# Patient Record
Sex: Male | Born: 1980 | Race: Black or African American | Hispanic: No | Marital: Single | Smoking: Current every day smoker
Health system: Southern US, Community
[De-identification: ages and names within clinical notes are randomized; demographics above are authoritative.]

## PROBLEM LIST (undated history)

## (undated) DIAGNOSIS — J45909 Unspecified asthma, uncomplicated: Secondary | ICD-10-CM

## (undated) DIAGNOSIS — E119 Type 2 diabetes mellitus without complications: Secondary | ICD-10-CM

## (undated) DIAGNOSIS — T7840XA Allergy, unspecified, initial encounter: Secondary | ICD-10-CM

## (undated) DIAGNOSIS — R609 Edema, unspecified: Secondary | ICD-10-CM

## (undated) HISTORY — DX: Allergy, unspecified, initial encounter: T78.40XA

## (undated) HISTORY — PX: KNEE SURGERY: SHX244

## (undated) HISTORY — DX: Edema, unspecified: R60.9

## (undated) HISTORY — DX: Morbid (severe) obesity due to excess calories: E66.01

---

## 1998-10-11 ENCOUNTER — Encounter: Payer: Self-pay | Admitting: Emergency Medicine

## 1998-10-11 ENCOUNTER — Emergency Department (HOSPITAL_COMMUNITY): Admission: EM | Admit: 1998-10-11 | Discharge: 1998-10-11 | Payer: Self-pay | Admitting: Emergency Medicine

## 1999-02-11 ENCOUNTER — Emergency Department (HOSPITAL_COMMUNITY): Admission: EM | Admit: 1999-02-11 | Discharge: 1999-02-11 | Payer: Self-pay | Admitting: Emergency Medicine

## 1999-02-11 ENCOUNTER — Encounter: Payer: Self-pay | Admitting: Emergency Medicine

## 2000-10-15 ENCOUNTER — Emergency Department (HOSPITAL_COMMUNITY): Admission: EM | Admit: 2000-10-15 | Discharge: 2000-10-15 | Payer: Self-pay | Admitting: Emergency Medicine

## 2000-10-16 ENCOUNTER — Emergency Department (HOSPITAL_COMMUNITY): Admission: EM | Admit: 2000-10-16 | Discharge: 2000-10-16 | Payer: Self-pay | Admitting: Emergency Medicine

## 2006-09-01 ENCOUNTER — Emergency Department (HOSPITAL_COMMUNITY): Admission: EM | Admit: 2006-09-01 | Discharge: 2006-09-01 | Payer: Self-pay | Admitting: Emergency Medicine

## 2008-05-04 ENCOUNTER — Emergency Department (HOSPITAL_COMMUNITY): Admission: EM | Admit: 2008-05-04 | Discharge: 2008-05-04 | Payer: Self-pay | Admitting: Emergency Medicine

## 2011-08-13 ENCOUNTER — Encounter: Payer: Self-pay | Admitting: Student

## 2011-08-13 ENCOUNTER — Emergency Department (HOSPITAL_COMMUNITY)
Admission: EM | Admit: 2011-08-13 | Discharge: 2011-08-14 | Disposition: A | Discharge status: Home / Self Care | Payer: No Typology Code available for payment source | Attending: Emergency Medicine | Admitting: Emergency Medicine

## 2011-08-13 ENCOUNTER — Emergency Department (HOSPITAL_COMMUNITY): Payer: No Typology Code available for payment source

## 2011-08-13 DIAGNOSIS — R55 Syncope and collapse: Secondary | ICD-10-CM | POA: Insufficient documentation

## 2011-08-13 DIAGNOSIS — M25519 Pain in unspecified shoulder: Secondary | ICD-10-CM

## 2011-08-13 DIAGNOSIS — F17200 Nicotine dependence, unspecified, uncomplicated: Secondary | ICD-10-CM | POA: Insufficient documentation

## 2011-08-13 DIAGNOSIS — M542 Cervicalgia: Secondary | ICD-10-CM | POA: Insufficient documentation

## 2011-08-13 DIAGNOSIS — M549 Dorsalgia, unspecified: Secondary | ICD-10-CM | POA: Insufficient documentation
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
## 2011-08-14 ENCOUNTER — Other Ambulatory Visit: Payer: Self-pay

## 2011-08-14 MED ORDER — METHOCARBAMOL 500 MG PO TABS: 500.0000 mg | ORAL_TABLET | Freq: Two times a day (BID) | ORAL | Status: AC

## 2011-08-14 MED ORDER — IBUPROFEN 800 MG PO TABS
800.0000 mg | ORAL_TABLET | Freq: Once | ORAL | Status: AC
  Administered 2011-08-14: 01:00:00 via ORAL

## 2011-08-14 MED ORDER — IBUPROFEN 800 MG PO TABS
ORAL_TABLET | ORAL | Status: AC
  Filled 2011-08-14: qty 1

## 2011-08-14 MED ORDER — TRAMADOL HCL 50 MG PO TABS: 50.0000 mg | ORAL_TABLET | Freq: Four times a day (QID) | ORAL | Status: AC | PRN

## 2011-08-14 NOTE — ED Provider Notes (Signed)
Medical screening examination/treatment/procedure(s) were performed by non-physician practitioner and as supervising physician I was immediately available for consultation/collaboration.   Nat Christen, MD 08/14/11 1500

## 2012-07-11 ENCOUNTER — Emergency Department (HOSPITAL_COMMUNITY)
Admission: EM | Admit: 2012-07-11 | Discharge: 2012-07-11 | Disposition: A | Discharge status: Home / Self Care | Payer: Self-pay | Attending: Emergency Medicine | Admitting: Emergency Medicine

## 2012-07-11 DIAGNOSIS — IMO0002 Reserved for concepts with insufficient information to code with codable children: Secondary | ICD-10-CM

## 2012-07-11 DIAGNOSIS — F17200 Nicotine dependence, unspecified, uncomplicated: Secondary | ICD-10-CM | POA: Insufficient documentation

## 2012-07-11 DIAGNOSIS — S61209A Unspecified open wound of unspecified finger without damage to nail, initial encounter: Secondary | ICD-10-CM | POA: Insufficient documentation

## 2012-07-11 DIAGNOSIS — Z23 Encounter for immunization: Secondary | ICD-10-CM | POA: Insufficient documentation

## 2012-07-11 DIAGNOSIS — W268XXA Contact with other sharp object(s), not elsewhere classified, initial encounter: Secondary | ICD-10-CM | POA: Insufficient documentation

## 2012-07-11 MED ORDER — TETANUS-DIPHTH-ACELL PERTUSSIS 5-2.5-18.5 LF-MCG/0.5 IM SUSP
0.5000 mL | Freq: Once | INTRAMUSCULAR | Status: AC
  Administered 2012-07-11: 0.5 mL via INTRAMUSCULAR
  Filled 2012-07-11: qty 0.5

## 2012-07-11 MED ORDER — CEPHALEXIN 500 MG PO CAPS: 1000.0000 mg | ORAL_CAPSULE | Freq: Two times a day (BID) | ORAL | Status: DC

## 2012-07-11 NOTE — ED Provider Notes (Signed)
History     CSN: [id number]  Arrival date & time 07/11/12  1819   First MD Initiated Contact with Patient 07/11/12 1821      Chief Complaint  Patient presents with  . Laceration    (Consider location/radiation/quality/duration/timing/severity/associated sxs/prior treatment) HPI  31 y.o. male in no acute distress complaining of laceration to right second digit on a piece of bamboo earlier in the day. Tetanus status is unknown and bleeding is controlled.  No past medical history on file.  Past Surgical History  Procedure Date  . Knee surgery     Right and Left Knee    No family history on file.  History  Substance Use Topics  . Smoking status: Current Every Day Smoker  . Smokeless tobacco: Not on file  . Alcohol Use: No      Review of Systems  Constitutional: Negative for fever.  Respiratory: Negative for shortness of breath.   Cardiovascular: Negative for chest pain.  Gastrointestinal: Negative for nausea, vomiting, abdominal pain and diarrhea.  Skin: Positive for wound.  All other systems reviewed and are negative.    Allergies  Review of patient's allergies indicates no known allergies.  Home Medications   Current Outpatient Rx  Name Route Sig Dispense Refill  . CEPHALEXIN 500 MG PO CAPS Oral Take 2 capsules (1,000 mg total) by mouth 2 (two) times daily. 28 capsule 0    BP 125/82  Pulse 78  Temp 99 F (37.2 C)  Resp 16  SpO2 96%  Physical Exam  Nursing note and vitals reviewed. Constitutional: He is oriented to person, place, and time. He appears well-developed and well-nourished. No distress.  HENT:  Head: Normocephalic.  Eyes: Conjunctivae normal and EOM are normal.  Cardiovascular: Normal rate.   Pulmonary/Chest: Effort normal. No stridor.  Musculoskeletal: Normal range of motion.  Neurological: He is alert and oriented to person, place, and time.  Skin:       1 cm full-thickness laceration to right first digit radial side of the PIP.  Bleeding is controlled. Wound is grossly clean. There is no joint involvement. Patient has full range of motion to the isolated PIP. Cap refill is less than 2 seconds and distal sensation is intact.  Psychiatric: He has a normal mood and affect.    ED Course  Procedures (including critical care time)  LACERATION REPAIR Performed by: Wynetta Emery Authorized by: Wynetta Emery Consent: Verbal consent obtained. Risks and benefits: risks, benefits and alternatives were discussed Consent given by: patient Patient identity confirmed: provided demographic data Prepped and Draped in normal sterile fashion Wound explored  Laceration Location: Right hand second digit radial side of the distal interphalangeal joint  Laceration Length: 1 cm  No Foreign Bodies seen or palpated  Anesthesia: None  Irrigation method: syringe Amount of cleaning: standard  Skin closure: Dermabond applied in 3 layers.    Patient tolerance: Patient tolerated the procedure well with no immediate complications.   Labs Reviewed - No data to display No results found.   1. Laceration       MDM  Full-thickness laceration with no joint involvement closed with Dermabond. Tetanus will be updated and patient will be given Keflex and return precautions.  New Prescriptions   CEPHALEXIN (KEFLEX) 500 MG CAPSULE    Take 2 capsules (1,000 mg total) by mouth 2 (two) times daily.         Wynetta Emery, PA-C 07/11/12 1905

## 2012-07-11 NOTE — ED Notes (Signed)
Pt cut tip of R index finger on a piece of bamboo. Bleeding controlled with pressure. Pt not sure when his last Tetanus shot was.

## 2012-07-15 NOTE — ED Provider Notes (Signed)
Medical screening examination/treatment/procedure(s) were performed by non-physician practitioner and as supervising physician I was immediately available for consultation/collaboration.  Raeford Razor, MD 07/15/12 2350

## 2012-09-21 ENCOUNTER — Emergency Department (HOSPITAL_COMMUNITY)
Admission: EM | Admit: 2012-09-21 | Discharge: 2012-09-21 | Disposition: A | Discharge status: Home / Self Care | Payer: No Typology Code available for payment source | Attending: Emergency Medicine | Admitting: Emergency Medicine

## 2012-09-21 ENCOUNTER — Encounter (HOSPITAL_COMMUNITY): Payer: Self-pay | Admitting: *Deleted

## 2012-09-21 ENCOUNTER — Emergency Department (HOSPITAL_COMMUNITY): Payer: No Typology Code available for payment source

## 2012-09-21 DIAGNOSIS — Y939 Activity, unspecified: Secondary | ICD-10-CM | POA: Insufficient documentation

## 2012-09-21 DIAGNOSIS — J45909 Unspecified asthma, uncomplicated: Secondary | ICD-10-CM | POA: Insufficient documentation

## 2012-09-21 DIAGNOSIS — M549 Dorsalgia, unspecified: Secondary | ICD-10-CM | POA: Insufficient documentation

## 2012-09-21 DIAGNOSIS — Y92410 Unspecified street and highway as the place of occurrence of the external cause: Secondary | ICD-10-CM | POA: Insufficient documentation

## 2012-09-21 DIAGNOSIS — F17200 Nicotine dependence, unspecified, uncomplicated: Secondary | ICD-10-CM | POA: Insufficient documentation

## 2012-09-21 DIAGNOSIS — M25519 Pain in unspecified shoulder: Secondary | ICD-10-CM | POA: Insufficient documentation

## 2012-09-21 DIAGNOSIS — M7918 Myalgia, other site: Secondary | ICD-10-CM

## 2012-09-21 HISTORY — DX: Unspecified asthma, uncomplicated: J45.909

## 2012-09-21 MED ORDER — OXYCODONE-ACETAMINOPHEN 5-325 MG PO TABS: ORAL_TABLET | ORAL | Status: DC

## 2012-09-21 NOTE — ED Notes (Signed)
Pt alert and oriented x4. Respirations even and unlabored, bilateral symmetrical rise and fall of chest. Skin warm and dry. In no acute distress. Denies needs.   

## 2012-09-21 NOTE — ED Provider Notes (Signed)
Medical screening examination/treatment/procedure(s) were performed by non-physician practitioner and as supervising physician I was immediately available for consultation/collaboration.  Sadik Piascik, MD 09/21/12 2344 
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
## 2013-03-23 ENCOUNTER — Ambulatory Visit (HOSPITAL_COMMUNITY): Admission: RE | Admit: 2013-03-23 | Payer: No Typology Code available for payment source | Source: Ambulatory Visit

## 2013-03-23 ENCOUNTER — Encounter (HOSPITAL_COMMUNITY): Payer: Self-pay | Admitting: Emergency Medicine

## 2013-03-23 ENCOUNTER — Emergency Department (HOSPITAL_COMMUNITY)
Admission: EM | Admit: 2013-03-23 | Discharge: 2013-03-23 | Disposition: A | Discharge status: Home / Self Care | Payer: Self-pay | Attending: Emergency Medicine | Admitting: Emergency Medicine

## 2013-03-23 DIAGNOSIS — M79609 Pain in unspecified limb: Secondary | ICD-10-CM | POA: Insufficient documentation

## 2013-03-23 DIAGNOSIS — M25473 Effusion, unspecified ankle: Secondary | ICD-10-CM | POA: Insufficient documentation

## 2013-03-23 DIAGNOSIS — Z79899 Other long term (current) drug therapy: Secondary | ICD-10-CM | POA: Insufficient documentation

## 2013-03-23 DIAGNOSIS — J45909 Unspecified asthma, uncomplicated: Secondary | ICD-10-CM | POA: Insufficient documentation

## 2013-03-23 DIAGNOSIS — M79672 Pain in left foot: Secondary | ICD-10-CM

## 2013-03-23 DIAGNOSIS — M2550 Pain in unspecified joint: Secondary | ICD-10-CM | POA: Insufficient documentation

## 2013-03-23 DIAGNOSIS — M25476 Effusion, unspecified foot: Secondary | ICD-10-CM | POA: Insufficient documentation

## 2013-03-23 DIAGNOSIS — F17200 Nicotine dependence, unspecified, uncomplicated: Secondary | ICD-10-CM | POA: Insufficient documentation

## 2013-03-23 MED ORDER — SULFAMETHOXAZOLE-TRIMETHOPRIM 800-160 MG PO TABS: 1.0000 | ORAL_TABLET | Freq: Two times a day (BID) | ORAL | Status: AC

## 2013-03-23 MED ORDER — ENOXAPARIN SODIUM 150 MG/ML ~~LOC~~ SOLN
180.0000 mg | SUBCUTANEOUS | Status: AC
  Administered 2013-03-23: 180 mg via SUBCUTANEOUS
  Filled 2013-03-23: qty 2

## 2013-03-23 MED ORDER — TRAMADOL HCL 50 MG PO TABS: 50.0000 mg | ORAL_TABLET | Freq: Four times a day (QID) | ORAL | Status: DC | PRN

## 2013-03-23 NOTE — ED Notes (Signed)
Pt alert, arrives from home, c/o pain in left foot, denies trauma or injury, states "its fathers day, i am here to get it checked out", resp even unlabored, skin pwd

## 2013-03-23 NOTE — ED Notes (Signed)
Humes, PA at bedside to evaluate patient.

## 2013-03-23 NOTE — ED Provider Notes (Signed)
History     CSN: [id number]  Arrival date & time 03/23/13  0213   First MD Initiated Contact with Patient 03/23/13 [phone]      Chief Complaint  Patient presents with  . Foot Pain    (Consider location/radiation/quality/duration/timing/severity/associated sxs/prior treatment) HPI Comments: Patient is a 32 year old male who presents for left foot pain x3 days. Patient states the symptoms are associated with swelling in his ankle as well as redness. Patient denies trauma or injury to his left foot or ankle. He denies calf tenderness, numbness or tingling, extremity weakness, shortness of breath, and fevers. Patient is ambulatory without difficulty.  Patient is a 32 y.o. male presenting with lower extremity pain. The history is provided by the patient. No language interpreter was used.  Foot Pain This is a new problem. Episode onset: 3 days. The problem occurs intermittently. The problem has been gradually worsening. Associated symptoms include arthralgias and joint swelling (ankle and foot). Pertinent negatives include no fatigue, fever, nausea, numbness, rash or weakness. The symptoms are aggravated by walking. He has tried nothing for the symptoms.    Past Medical History  Diagnosis Date  . Asthma     Past Surgical History  Procedure Laterality Date  . Knee surgery      Right and Left Knee    No family history on file.  History  Substance Use Topics  . Smoking status: Current Every Day Smoker    Types: Cigarettes  . Smokeless tobacco: Not on file  . Alcohol Use: No     Review of Systems  Constitutional: Negative for fever and fatigue.  Respiratory: Negative for shortness of breath.   Gastrointestinal: Negative for nausea.  Musculoskeletal: Positive for joint swelling (ankle and foot) and arthralgias. Negative for gait problem.  Skin: Positive for color change. Negative for rash and wound.  Neurological: Negative for weakness and numbness.  All other systems reviewed  and are negative.    Allergies  Review of patient's allergies indicates no known allergies.  Home Medications   Current Outpatient Rx  Name  Route  Sig  Dispense  Refill  . sulfamethoxazole-trimethoprim (BACTRIM DS,SEPTRA DS) 800-160 MG per tablet   Oral   Take 1 tablet by mouth 2 (two) times daily. Begin course if results of ultrasound are negative.   14 tablet   0   . traMADol (ULTRAM) 50 MG tablet   Oral   Take 1 tablet (50 mg total) by mouth every 6 (six) hours as needed for pain.   15 tablet   0     BP 138/77  Pulse 81  Temp(Src) 98 F (36.7 C)  Resp 16  SpO2 99%  Physical Exam  Nursing note and vitals reviewed. Constitutional: He is oriented to person, place, and time. He appears well-developed and well-nourished. No distress.  HENT:  Head: Normocephalic and atraumatic.  Eyes: Conjunctivae and EOM are normal.  Neck: Normal range of motion. Neck supple.  Cardiovascular: Normal rate, regular rhythm and intact distal pulses.   Dorsal pedis and posterior tibial pulses 2+ bilaterally. Capillary refill normal in bilateral lower extremities.  Pulmonary/Chest: Effort normal. No respiratory distress.  Musculoskeletal:       Left ankle: He exhibits swelling (Mild). He exhibits normal range of motion, no ecchymosis, no deformity, no laceration and normal pulse. No tenderness. Achilles tendon normal.       Left lower leg: Normal.       Left foot: He exhibits tenderness (Mild) and swelling (Mild). He exhibits  normal range of motion, no bony tenderness, normal capillary refill, no deformity and no laceration.       Feet:  Dorsal surface of left foot proximal to great toe erythematous with mild swelling and heat-to-touch. There is no decreased range of motion; very minimal TTP appreciated. No calf tenderness. Capillary refill, DP/PT pulses, and sensation normal.  Neurological: He is alert and oriented to person, place, and time.  Skin: Skin is warm and dry. No rash noted. He  is not diaphoretic. There is erythema. No pallor.  Psychiatric: He has a normal mood and affect. His behavior is normal.    ED Course  Procedures (including critical care time)  Labs Reviewed - No data to display No results found.   1. Foot pain, left     MDM  Left foot discomfort and swelling x3 days. DDx - cellulitis vs DVT. Patient neurovascularly intact on physical exam with normal sensation and capillary refill. Have advised patient to have symptoms further evaluated with Venous Duplex to definitively r/o DVT in LLE. Have given the patient the options to have study performed in ED once U/S arrives or to have study done as an outpatient. Patient opts to have study done as outpatient. Will give dose of Lovenox for empiric tx of DVT in ED. Patient appropriate for discharge with instructions for outpatient Venous Duplex. Have also prescribed Bactrim to take for symptoms if venous duplex is negative. Tramadol prescribed for discomfort. Patient verbalizes comfort and understanding with plan with no unaddressed concerns. Patient case discussed with Dr. Preston Fleeting who is in agreement.        Antony Madura, PA-C 03/25/13 [phone]

## 2013-03-24 ENCOUNTER — Ambulatory Visit (HOSPITAL_COMMUNITY)
Admission: RE | Admit: 2013-03-24 | Discharge: 2013-03-24 | Disposition: A | Discharge status: Home / Self Care | Payer: Self-pay | Source: Ambulatory Visit | Attending: Emergency Medicine | Admitting: Emergency Medicine

## 2013-03-24 DIAGNOSIS — M7989 Other specified soft tissue disorders: Secondary | ICD-10-CM | POA: Insufficient documentation

## 2013-03-24 DIAGNOSIS — M79609 Pain in unspecified limb: Secondary | ICD-10-CM | POA: Insufficient documentation

## 2013-03-24 NOTE — Progress Notes (Signed)
Left lower extremity venous duplex completed.  Left:  No evidence of DVT, superficial thrombosis, or Baker's cyst.  Right:  Negative for DVT in the common femoral vein.  

## 2013-03-27 NOTE — ED Provider Notes (Signed)
Medical screening examination/treatment/procedure(s) were performed by non-physician practitioner and as supervising physician I was immediately available for consultation/collaboration.  Dione Booze, MD 03/27/13 [phone]

## 2014-02-01 ENCOUNTER — Emergency Department (HOSPITAL_COMMUNITY)
Admission: EM | Admit: 2014-02-01 | Discharge: 2014-02-01 | Disposition: A | Discharge status: Home / Self Care | Payer: No Typology Code available for payment source | Attending: Emergency Medicine | Admitting: Emergency Medicine

## 2014-02-01 ENCOUNTER — Encounter (HOSPITAL_COMMUNITY): Payer: Self-pay | Admitting: Emergency Medicine

## 2014-02-01 DIAGNOSIS — J45909 Unspecified asthma, uncomplicated: Secondary | ICD-10-CM | POA: Insufficient documentation

## 2014-02-01 DIAGNOSIS — F17200 Nicotine dependence, unspecified, uncomplicated: Secondary | ICD-10-CM | POA: Insufficient documentation

## 2014-02-01 DIAGNOSIS — H00019 Hordeolum externum unspecified eye, unspecified eyelid: Secondary | ICD-10-CM | POA: Insufficient documentation

## 2014-02-01 DIAGNOSIS — Z79899 Other long term (current) drug therapy: Secondary | ICD-10-CM | POA: Insufficient documentation

## 2014-02-01 MED ORDER — TETRACAINE HCL 0.5 % OP SOLN
1.0000 [drp] | Freq: Once | OPHTHALMIC | Status: AC
  Administered 2014-02-01: 1 [drp] via OPHTHALMIC
  Filled 2014-02-01: qty 2

## 2014-02-01 MED ORDER — FLUORESCEIN SODIUM 1 MG OP STRP
1.0000 | ORAL_STRIP | Freq: Once | OPHTHALMIC | Status: AC
  Administered 2014-02-01: 1 via OPHTHALMIC
  Filled 2014-02-01: qty 1

## 2014-02-01 NOTE — ED Notes (Signed)
Right eye 20/40 Left eye 20/25

## 2014-02-01 NOTE — ED Notes (Signed)
Per pt, states he mowed lawn last tues and now his left eye is irritated and swollen

## 2014-02-01 NOTE — ED Notes (Signed)
Patient left the department prior to receiving discharge instructions.

## 2014-02-01 NOTE — Progress Notes (Signed)
[facility] provided pt with a list of primary care resources and a [facility] [employer] application to help patient establish primary care.  °

## 2014-02-01 NOTE — ED Provider Notes (Signed)
CSN: [id number]     Arrival date & time 02/01/14  1308 History  This chart was scribed for non-physician practitioner, Coral CeoJessica Miral Hoopes, PA-C working with Audree CamelScott T Goldston, MD by Greggory StallionKayla Andersen, ED scribe. This patient was seen in room WTR8/WTR8 and the patient's care was started at 3:17 PM.   Chief Complaint  Patient presents with  . Facial Swelling   The history is provided by the patient. No language interpreter was used.   HPI Comments: [place] P Benson is a 33 y.o. male with a PMH of asthma who presents to the Emergency Department complaining of left eye swelling which started 6 days ago after mowing the lawn. Denies any known foreign bodies or trauma. Complains of a constant aching pain, itching, and a "knot" to the left upper inner eyelid. He has taken OTC allergy medications with no relief. Denies eye discharge, visual disturbance, photophobia, rhinorrhea, congestion, sore throat, ear pain, fever, or headache. Patient wears glasses and contacts occasionally.    Past Medical History  Diagnosis Date  . Asthma    Past Surgical History  Procedure Laterality Date  . Knee surgery      Right and Left Knee   No family history on file. History  Substance Use Topics  . Smoking status: Current Every Day Smoker    Types: Cigarettes  . Smokeless tobacco: Not on file  . Alcohol Use: No    Review of Systems  Constitutional: Negative for fever, chills, activity change, appetite change and fatigue.  HENT: Negative for congestion, ear discharge, ear pain, facial swelling, rhinorrhea, sore throat and trouble swallowing.   Eyes: Positive for pain and itching. Negative for photophobia, discharge, redness and visual disturbance.  Gastrointestinal: Negative for nausea, vomiting and abdominal pain.  Skin: Negative for wound.  Neurological: Negative for dizziness, light-headedness and headaches.  All other systems reviewed and are negative.  Allergies  Review of patient's allergies indicates no  known allergies.  Home Medications   Prior to Admission medications   Medication Sig Start Date End Date Taking? Authorizing Provider  traMADol (ULTRAM) 50 MG tablet Take 1 tablet (50 mg total) by mouth every 6 (six) hours as needed for pain. 03/23/13   Antony MaduraKelly Humes, PA-C   BP 131/71  Pulse 84  Temp(Src) 98.6 F (37 C) (Oral)  Resp 16  SpO2 96%  Filed Vitals:   02/01/14 1337  BP: 131/71  Pulse: 84  Temp: 98.6 F (37 C)  TempSrc: Oral  Resp: 16  SpO2: 96%    Physical Exam  Nursing note and vitals reviewed. Constitutional: He is oriented to person, place, and time. He appears well-developed and well-nourished. No distress.  HENT:  Head: Normocephalic and atraumatic.  Right Ear: Tympanic membrane, external ear and ear canal normal.  Left Ear: Tympanic membrane, external ear and ear canal normal.  Nose: Nose normal.  Mouth/Throat: Oropharynx is clear and moist. No oropharyngeal exudate.  Tympanic membranes gray and translucent bilaterally with no erythema, edema, or hemotympanum.  No mastoid or tragal tenderness bilaterally. No erythema to the posterior pharynx. Tonsils without edema or exudates. Uvula midline. No trismus. No difficulty controlling secretions.   Eyes: Conjunctivae and EOM are normal. Pupils are equal, round, and reactive to light. Right eye exhibits no discharge and no exudate. No foreign body present in the right eye. Left eye exhibits hordeolum. Left eye exhibits no discharge and no exudate. No foreign body present in the left eye. Right conjunctiva is not injected. Right conjunctiva has no hemorrhage.  Left conjunctiva is not injected. Left conjunctiva has no hemorrhage.    5 mm circular firm mass palpated in the medial upper left eyelid with associated erythema and edema. Sclera clear bilaterally. No foreign bodies or lacerations. No pain with eye movement. No drainage.   Neck: Neck supple. No tracheal deviation present.  No cervical lymphadenopathy. No nuchal  rigidity.   Cardiovascular: Normal rate.   Pulmonary/Chest: Effort normal. No respiratory distress.  Musculoskeletal: Normal range of motion.  Neurological: He is alert and oriented to person, place, and time.  Skin: Skin is warm and dry. He is not diaphoretic.  Psychiatric: He has a normal mood and affect. His behavior is normal.    ED Course  Procedures (including critical care time)  DIAGNOSTIC STUDIES: Oxygen Saturation is 96% on RA, normal by my interpretation.    COORDINATION OF CARE: 3:21 PM-Discussed treatment plan which includes visual acuity and checking for corneal abrasion with pt at bedside and pt agreed to plan.   Labs Review Labs Reviewed - No data to display  Imaging Review No results found.   EKG Interpretation None      MDM   [place] P Kang is a 33 y.o. male with a PMH of asthma who presents to the Emergency Department complaining of left eye swelling which started 6 days ago after mowing the lawn.  Rechecks  3:50 PM = Right eye 20/40 & Left eye 20/25 4:00 PM = No increased uptake or foreign body on woods lamp exam.    Etiology of eye pain possibly due to a stye vs chalazion. More likely a stye due to pain. No evidence of a foreign body or corneal abrasion. Visual acuity intact. No evidence of a conjunctivitis or periorbital edema. No discharge or evidence of an infectious process. Did not feel patient required antibiotic drops at this time. Patient afebrile and non-toxic in appearance. Instructed patient to apply warm compresses. Follow-up with PCP if not improving or resolving. Return precautions, discharge instructions, and follow-up was discussed with the patient before discharge.     Discharge Medication List as of 02/01/2014  4:18 PM      Final impressions: 1. Stye      Luiz IronJessica Katlin Lizzet Hendley PA-C    I personally performed the services described in this documentation, which was scribed in my presence. The recorded information has been  reviewed and is accurate.  Jillyn LedgerJessica K [place], PA-C 02/02/14 1251

## 2014-02-01 NOTE — Discharge Instructions (Signed)
Your eyelid edema may be due to a sty or chalazion Please follow-up with an eye doctor (or your primary doctor) if your symptoms worsen or are not improving or worsening  Return to the emergency department if you develop any changing/worsening condition, fever, eyelid swelling, pain with eye movement, eye drainage, or any other concerns (please read additional information regarding your condition below)  Sty A sty (hordeolum) is an infection of a gland in the eyelid located at the base of the eyelash. A sty may develop a white or yellow head of pus. It can be puffy (swollen). Usually, the sty will burst and pus will come out on its own. They do not leave lumps in the eyelid once they drain. A sty is often confused with another form of cyst of the eyelid called a chalazion. Chalazions occur within the eyelid and not on the edge where the bases of the eyelashes are. They often are red, sore and then form firm lumps in the eyelid. CAUSES   Germs (bacteria).  Lasting (chronic) eyelid inflammation. SYMPTOMS   Tenderness, redness and swelling along the edge of the eyelid at the base of the eyelashes.  Sometimes, there is a white or yellow head of pus. It may or may not drain. DIAGNOSIS  An ophthalmologist will be able to distinguish between a sty and a chalazion and treat the condition appropriately.  TREATMENT   Styes are typically treated with warm packs (compresses) until drainage occurs.  In rare cases, medicines that kill germs (antibiotics) may be prescribed. These antibiotics may be in the form of drops, cream or pills.  If a hard lump has formed, it is generally necessary to do a small incision and remove the hardened contents of the cyst in a minor surgical procedure done in the office.  In suspicious cases, your caregiver may send the contents of the cyst to the lab to be certain that it is not a rare, but dangerous form of cancer of the glands of the eyelid. HOME CARE INSTRUCTIONS     Wash your hands often and dry them with a clean towel. Avoid touching your eyelid. This may spread the infection to other parts of the eye.  Apply heat to your eyelid for 10 to 20 minutes, several times a day, to ease pain and help to heal it faster.  Do not squeeze the sty. Allow it to drain on its own. Wash your eyelid carefully 3 to 4 times per day to remove any pus. SEEK IMMEDIATE MEDICAL CARE IF:   Your eye becomes painful or puffy (swollen).  Your vision changes.  Your sty does not drain by itself within 3 days.  Your sty comes back within a short period of time, even with treatment.  You have redness (inflammation) around the eye.  You have a fever. Document Released: 07/04/2005 Document Revised: 12/17/2011 Document Reviewed: 03/08/2009 [facility] Patient Information 2014 [place], [place].   Emergency Department Resource Guide 1) Find a Doctor and Pay Out of Pocket Although you won't have to find out who is covered by your insurance plan, it is a good idea to ask around and get recommendations. You will then need to call the office and see if the doctor you have chosen will accept you as a new patient and what types of options they offer for patients who are self-pay. Some doctors offer discounts or will set up payment plans for their patients who do not have insurance, but you will need to ask so  you aren't surprised when you get to your appointment.  2) Contact Your Local Health Department Not all health departments have doctors that can see patients for sick visits, but many do, so it is worth a call to see if yours does. If you don't know where your local health department is, you can check in your phone book. The CDC also has a tool to help you locate your state's health department, and many state websites also have listings of all of their local health departments.  3) Find a Walk-in Clinic If your illness is not likely to be very severe or complicated, you may want to try  a walk in clinic. These are popping up all over the country in pharmacies, drugstores, and shopping centers. They're usually staffed by nurse practitioners or physician assistants that have been trained to treat common illnesses and complaints. They're usually fairly quick and inexpensive. However, if you have serious medical issues or chronic medical problems, these are probably not your best option.  No Primary Care Doctor: - Call Health Connect at  [phone] - they can help you locate a primary care doctor that  accepts your insurance, provides certain services, etc. - Physician Referral Service- [phone]  Chronic Pain Problems: Organization         Address  Phone   Notes  [facility]  [phone] Patients need to be referred by their primary care doctor.   Medication Assistance: Organization         Address  Phone   Notes  [facility] Medication [facility] [address] [place] [place], [place] [address] [phone] --Must be a resident of [facility] -- Must have NO insurance coverage whatsoever (no Medicaid/ Medicare, etc.) -- The pt. MUST have a primary care doctor that directs their care regularly and follows them in the community   [facility]  [phone]   [employer]  [phone]    Agencies that provide inexpensive medical care: Organization         Address  Phone   Notes  [facility]  [phone]   [facility]    [phone]   [facility] [address] [place], [place] [address] [phone]   [facility] [address] [place] [place] [phone]   Planned Parenthood    [phone]   [facility]    [phone]   [facility] and [facility]  [address], [place] Phone:  [phone], Fax:  [phone] Hours of Operation:  9 am - 6 pm, M-F.  Also accepts Medicaid/Medicare and self-pay.  [facility]  [address], [place] Phone: [phone], Fax: [phone]. Hours of Operation:  8:30 am - 5:30 pm, M-F.  Also accepts Medicaid and self-pay.  [facility] [place] [address] [place] Phone: [phone]   [facility] [address] Natasha Bence [place], [place] [phone], Ext. [phone] Mondays & Thursdays: 7-9 AM.  First 15 patients are seen on a first come, first serve basis.    Medicaid-accepting [facility] Providers:  Organization         Address  Phone   Notes  [facility] [address], [place] [phone] Also accepts self-pay patients.  [facility] [address] Laurell Josephs [place], [place]  [phone]  [facility] [address], [place] [phone]   [facility] [address] [place] [address], [place] [phone]   Renaye RakersVeita Bland [address], [place]   [phone] Only accepts [place] Access [place] patients after they have their name applied to their card.   Self-Pay (no insurance) in [facility]:  Organization         Address  Phone   Notes  Sickle Cell Patients, [facility] [address], [place] [phone]   [facility] [address], [place] [phone]   [facility] [place]  [address], [place] [phone]   [facility]/Dr. Osei-Bonsu  [address] [place] [address], [place] or [phone], [place] [phone] Phone number for both [place] and [place] locations is the same.  [facility] [address], [place] [phone]   [facility] [address] [place] [address], [place] or [address] [phone]   [facility] [address], [place] [phone], phone; [phone], fax  Sees patients 1st and 3rd Saturday of every month.  Must not qualify for public or private insurance (i.e. Medicaid, Medicare, [place] Health Choice, Veterans' Benefits)  Household income should be no more than 200% of the poverty level The clinic cannot treat you if you are pregnant or think you are pregnant  Sexually transmitted diseases are not treated at the clinic.    Dental Care: Organization         Address  Phone  Notes  [facility] [address], [place] [phone] Accepts children up to age 321 who are enrolled in [place]icaid or [place] Health Choice; pregnant women with a Medicaid card; and children who have applied for Medicaid or [place] Health Choice, but were declined, whose parents can pay a reduced fee at time of service.  [facility] [place]  [address], [place] [phone] Accepts children up to age 35 who are enrolled in [place] or [place] Health Choice; pregnant women with a Medicaid card; and children who have applied for Medicaid or [place] Health Choice, but were declined, whose parents can pay a reduced fee at time of service.  [facility]  [address], [place] [phone] Patients are seen by appointment only. Walk-ins are not accepted. [facility] will see patients 35 years of age and older. Monday - Tuesday (8am-5pm) Most Wednesdays (8:30-5pm) $30 per visit, cash only  [facility]  [address], [facility] [phone] Patients are seen by appointment only. Walk-ins are not accepted. [facility] will see patients 35 years of age and older. One Wednesday Evening (Monthly: Volunteer Based).  $30 per visit, cash only  [facility]  [phone] for adults; Children under age 424, call Graduate Pediatric Dentistry at [phone]. Children aged 54-14, please call [phone] to  request a pediatric application.  Dental services are provided in all areas of dental care including fillings, crowns and bridges, complete and partial dentures, implants, gum treatment, root canals, and extractions. Preventive care is also provided. Treatment is provided to both adults and children. Patients are selected via a lottery and there is often a waiting list.  [facility] [address], [place]  [phone] [email or web address]   [facility] [address], [place], [place] [phone], Ext. [phone] Second and Fourth Thursday of each month, opens at 6:30 AM; Clinic ends at 9 AM.  Patients are seen on a first-come first-served basis, and a limited number are seen during each clinic.   [facility]  [address], [place], [place] [phone]   Eligibility Requirements You must have lived in [place], [place], or [place] counties for at least the last three months.   You cannot be eligible for state or federal sponsored National Cityhealthcare insurance, including CIGNAVeterans Administration, [place]icaid, or [employer]Medicare.   You generally cannot be eligible for healthcare insurance through your employer.    How to apply: Eligibility screenings are held every Tuesday and Wednesday afternoon from 1:00 pm until 4:00 pm. You do not need an appointment for the interview!  [facility] [address], [place], [place] [phone]   [facility]  [phone]   [facility]  [phone]   [facility]  [phone]    Behavioral Health Resources in the Community: Intensive Outpatient Programs Organization         Address  Phone  Notes  [facility] [address], [place], [place] [phone]   [facility] [address], [place], [place] [phone]   [facility] [address], [place], [place]  [phone]   [facility] [address],  [place], [place] [phone] or [phone]   Substance Abuse Resources Organization         Address  Phone  Notes  [facility]  [phone]   [facility]  [phone]   [facility]  [phone]   [facility]  [phone]   Residential & Outpatient Substance Abuse Program  [phone]   Psychological Services Organization         Address  Phone  Notes  [facility]  [phone]- [phone]   [facility]  [phone]   [facility] [address], [place] [phone] or [phone]    Mobile Crisis Teams Organization         Address  Phone  Notes  [facility], Mobile Crisis Care Unit  [phone]   [facility]  [address] [place], [place] [phone]   Doristine LocksSharon DeEsch [address] [place] [phone]    Self-Help/Support Groups Organization         Address  Phone             Notes  [facility] [place] - variety of support groups  336- [phone] Call for more information  Narcotics Anonymous (NA), [facility] [address], [place]  2 meetings at this location   Statisticianesidential Treatment Programs Organization         Address  Phone  Notes  [facility] [address],    [place]  [phone]   [facility]  [address], [place] [phone], [place], [place] [phone]   [facility] [address], [place] [phone] Admissions: 8am-3pm M-F  [facility] [address]    [place], [place] [phone]   [facility] [address], [place], [place] [phone]   [facility] [address]  [place], [place] [phone]   °[facility] - Intensive Outpatient [address], [place], [place] [phone]   °[facility] ([facility] [address]  °[place], [place] 1-877-615-2722 or [phone]   °[facility] ([facility]) [address] [place], [place] [phone] Accepts Medicaid  °[facility] [address]  °[place] 1-800-659-3381 Substance Abuse/Addiction Treatment  ° °[place] Behavioral Health Resources °Organization         Address  Phone  Notes  °[facility]  (888) [phone]   °Julie Brannon, PhD [address] [place], [place]   ([phone]) [phone] or (336) 951-0000   °[facility]   [address] °[place], [place] (336) [phone]   °[facility] [address], [place], [place] (336) [phone] Insurance/Medicaid/sponsorship through [facility]  °[facility] [address]                                    [place], [place] (336) [phone] Therapy/tele-psych/case  °[facility] [address]  ° [place], [place] (336) [phone]    °Dr. Arfeen  (336) [phone]   °[facility] [place]  [employer] [place] [facility] 1) [address], [place] °2) [address], [place] °3)  [address], [place] (336) 349-3220 °(336) 342-7768 ° °(336) [phone]   °[place] Child Abuse Hotline (336) 342-1394 or (336) 342-3537 (After Hours)    ° ° ° °

## 2014-02-10 NOTE — ED Provider Notes (Signed)
Medical screening examination/treatment/procedure(s) were performed by non-physician practitioner and as supervising physician I was immediately available for consultation/collaboration.   EKG Interpretation None        Annaliesa Blann T Geraldin Habermehl, MD 02/10/14 0704 

## 2015-03-23 ENCOUNTER — Encounter (HOSPITAL_COMMUNITY): Payer: Self-pay | Admitting: Nurse Practitioner

## 2015-03-23 ENCOUNTER — Emergency Department (HOSPITAL_COMMUNITY)
Admission: EM | Admit: 2015-03-23 | Discharge: 2015-03-23 | Disposition: A | Discharge status: Home / Self Care | Payer: Self-pay | Attending: Emergency Medicine | Admitting: Emergency Medicine

## 2015-03-23 DIAGNOSIS — X58XXXA Exposure to other specified factors, initial encounter: Secondary | ICD-10-CM | POA: Insufficient documentation

## 2015-03-23 DIAGNOSIS — Z720 Tobacco use: Secondary | ICD-10-CM | POA: Insufficient documentation

## 2015-03-23 DIAGNOSIS — Y9289 Other specified places as the place of occurrence of the external cause: Secondary | ICD-10-CM | POA: Insufficient documentation

## 2015-03-23 DIAGNOSIS — Y998 Other external cause status: Secondary | ICD-10-CM | POA: Insufficient documentation

## 2015-03-23 DIAGNOSIS — J45909 Unspecified asthma, uncomplicated: Secondary | ICD-10-CM | POA: Insufficient documentation

## 2015-03-23 DIAGNOSIS — S39012A Strain of muscle, fascia and tendon of lower back, initial encounter: Secondary | ICD-10-CM | POA: Insufficient documentation

## 2015-03-23 DIAGNOSIS — Y9389 Activity, other specified: Secondary | ICD-10-CM | POA: Insufficient documentation

## 2015-03-23 MED ORDER — METHOCARBAMOL 500 MG PO TABS: 500.0000 mg | ORAL_TABLET | Freq: Two times a day (BID) | ORAL | Status: DC

## 2015-03-23 MED ORDER — OXYCODONE-ACETAMINOPHEN 5-325 MG PO TABS
1.0000 | ORAL_TABLET | Freq: Once | ORAL | Status: AC
Start: 2015-03-23 — End: 2015-03-23
  Administered 2015-03-23: 1 via ORAL
  Filled 2015-03-23: qty 1

## 2015-03-23 MED ORDER — PREDNISONE 10 MG PO TABS: 20.0000 mg | ORAL_TABLET | Freq: Every day | ORAL | Status: DC

## 2015-03-23 MED ORDER — OXYCODONE-ACETAMINOPHEN 5-325 MG PO TABS: 1.0000 | ORAL_TABLET | Freq: Four times a day (QID) | ORAL | Status: DC | PRN

## 2015-03-23 MED ORDER — KETOROLAC TROMETHAMINE 60 MG/2ML IM SOLN
60.0000 mg | Freq: Once | INTRAMUSCULAR | Status: AC
  Administered 2015-03-23: 60 mg via INTRAMUSCULAR
  Filled 2015-03-23: qty 2

## 2015-03-23 MED ORDER — ONDANSETRON 4 MG PO TBDP
4.0000 mg | ORAL_TABLET | Freq: Once | ORAL | Status: AC
  Administered 2015-03-23: 4 mg via ORAL
  Filled 2015-03-23: qty 1

## 2015-03-23 NOTE — Discharge Instructions (Signed)
Back Pain, Adult °Low back pain is very common. About 1 in 5 people have back pain. The cause of low back pain is rarely dangerous. The pain often gets better over time. About half of people with a sudden onset of back pain feel better in just 2 weeks. About 8 in 10 people feel better by 6 weeks.  °CAUSES °Some common causes of back pain include: °· Strain of the muscles or ligaments supporting the spine. °· Wear and tear (degeneration) of the spinal discs. °· Arthritis. °· Direct injury to the back. °DIAGNOSIS °Most of the time, the direct cause of low back pain is not known. However, back pain can be treated effectively even when the exact cause of the pain is unknown. Answering your caregiver's questions about your overall health and symptoms is one of the most accurate ways to make sure the cause of your pain is not dangerous. If your caregiver needs more information, he or she may order lab work or imaging tests (X-rays or MRIs). However, even if imaging tests show changes in your back, this usually does not require surgery. °HOME CARE INSTRUCTIONS °For many people, back pain returns. Since low back pain is rarely dangerous, it is often a condition that people can learn to manage on their own.  °· Remain active. It is stressful on the back to sit or stand in one place. Do not sit, drive, or stand in one place for more than 30 minutes at a time. Take short walks on level surfaces as soon as pain allows. Try to increase the length of time you walk each day. °· Do not stay in bed. Resting more than 1 or 2 days can delay your recovery. °· Do not avoid exercise or work. Your body is made to move. It is not dangerous to be active, even though your back may hurt. Your back will likely heal faster if you return to being active before your pain is gone. °· Pay attention to your body when you  bend and lift. Many people have less discomfort when lifting if they bend their knees, keep the load close to their bodies, and  avoid twisting. Often, the most comfortable positions are those that put less stress on your recovering back. °· Find a comfortable position to sleep. Use a firm mattress and lie on your side with your knees slightly bent. If you lie on your back, put a pillow under your knees. °· Only take over-the-counter or prescription medicines as directed by your caregiver. Over-the-counter medicines to reduce pain and inflammation are often the most helpful. Your caregiver may prescribe muscle relaxant drugs. These medicines help dull your pain so you can more quickly return to your normal activities and healthy exercise. °· Put ice on the injured area. °¨ Put ice in a plastic bag. °¨ Place a towel between your skin and the bag. °¨ Leave the ice on for 15-20 minutes, 03-04 times a day for the first 2 to 3 days. After that, ice and heat may be alternated to reduce pain and spasms. °· Ask your caregiver about trying back exercises and gentle massage. This may be of some benefit. °· Avoid feeling anxious or stressed. Stress increases muscle tension and can worsen back pain. It is important to recognize when you are anxious or stressed and learn ways to manage it. Exercise is a great option. °SEEK MEDICAL CARE IF: °· You have pain that is not relieved with rest or medicine. °· You have pain that does not improve in 1 week. °· You have new symptoms. °· You are generally not feeling well. °SEEK   IMMEDIATE MEDICAL CARE IF:  °· You have pain that radiates from your back into your legs. °· You develop new bowel or bladder control problems. °· You have unusual weakness or numbness in your arms or legs. °· You develop nausea or vomiting. °· You develop abdominal pain. °· You feel faint. °Document Released: 09/24/2005 Document Revised: 03/25/2012 Document Reviewed: 01/26/2014 °ExitCare® Patient Information ©2015 [employer]. This information is not intended to replace advice given to you by your health care provider. Make sure you  discuss any questions you have with your health care provider. ° °Lumbosacral Strain °Lumbosacral strain is a strain of any of the parts that make up your lumbosacral vertebrae. Your lumbosacral vertebrae are the bones that make up the lower third of your backbone. Your lumbosacral vertebrae are held together by muscles and tough, fibrous tissue (ligaments).  °CAUSES  °A sudden blow to your back can cause lumbosacral strain. Also, anything that causes an excessive stretch of the muscles in the low back can cause this strain. This is typically seen when people exert themselves strenuously, fall, lift heavy objects, bend, or crouch repeatedly. °RISK FACTORS °· Physically demanding work. °· Participation in pushing or pulling sports or sports that require a sudden twist of the back (tennis, golf, baseball). °· Weight lifting. °· Excessive lower back curvature. °· Forward-tilted pelvis. °· Weak back or abdominal muscles or both. °· Tight hamstrings. °SIGNS AND SYMPTOMS  °Lumbosacral strain may cause pain in the area of your injury or pain that moves (radiates) down your leg.  °DIAGNOSIS °Your health care provider can often diagnose lumbosacral strain through a physical exam. In some cases, you may need tests such as X-ray exams.  °TREATMENT  °Treatment for your lower back injury depends on many factors that your clinician will have to evaluate. However, most treatment will include the use of anti-inflammatory medicines. °HOME CARE INSTRUCTIONS  °· Avoid hard physical activities (tennis, racquetball, waterskiing) if you are not in proper physical condition for it. This may aggravate or create problems. °· If you have a back problem, avoid sports requiring sudden body movements. Swimming and walking are generally safer activities. °· Maintain good posture. °· Maintain a healthy weight. °· For acute conditions, you may put ice on the injured area. °¨ Put ice in a plastic bag. °¨ Place a towel between your skin and the  bag. °¨ Leave the ice on for 20 minutes, 2-3 times a day. °· When the low back starts healing, stretching and strengthening exercises may be recommended. °SEEK MEDICAL CARE IF: °· Your back pain is getting worse. °· You experience severe back pain not relieved with medicines. °SEEK IMMEDIATE MEDICAL CARE IF:  °· You have numbness, tingling, weakness, or problems with the use of your arms or legs. °· There is a change in bowel or bladder control. °· You have increasing pain in any area of the body, including your belly (abdomen). °· You notice shortness of breath, dizziness, or feel faint. °· You feel sick to your stomach (nauseous), are throwing up (vomiting), or become sweaty. °· You notice discoloration of your toes or legs, or your feet get very cold. °MAKE SURE YOU:  °· Understand these instructions. °· Will watch your condition. °· Will get help right away if you are not doing well or get worse. °Document Released: 07/04/2005 Document Revised: 09/29/2013 Document Reviewed: 05/13/2013 °ExitCare® Patient Information ©2015 [employer]. This information is not intended to replace advice given to you by your health care provider.   Make sure you discuss any questions you have with your health care provider. ° °

## 2015-03-23 NOTE — ED Notes (Signed)
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

## 2015-03-23 NOTE — ED Provider Notes (Signed)
CSN: [id number]     Arrival date & time 03/23/15  1604 History   This chart was scribed for Shawn Pel, PA-C working with Shawn Hong, MD by Shawn Benson, ED Scribe. This patient was seen in room WTR9/WTR9 and the patient's care was started at 4:52 PM.   Chief Complaint  Patient presents with  . Back Pain    Lower   The history is provided by the patient. No language interpreter was used.   HPI Comments: Shawn Benson is a 34 y.o. male who presents to the Emergency Department complaining of acute onset, right mid- lower back pain yesterday  when bending over to wash dishes; patient is approximately 6'7'' and weighs 400 lbs. Patient reports alleviation of his back with laying down and wearing a compression belt and states that movement "hurts, but it feels good."  Patient works in [employer], but does not attribute his work to his current back pain. Patient denies bladder/bowel incontinence, lower extremity weakness, abdominal pain, nausea, hematuria or dysuria.    Past Medical History  Diagnosis Date  . Asthma    Past Surgical History  Procedure Laterality Date  . Knee surgery      Right and Left Knee   History reviewed. No pertinent family history. History  Substance Use Topics  . Smoking status: Current Every Day Smoker -- 3.00 packs/day    Types: Cigarettes  . Smokeless tobacco: Not on file  . Alcohol Use: No    Review of Systems  Constitutional: Negative for fever and chills.  Gastrointestinal: Negative for vomiting and abdominal pain.  Genitourinary: Negative for hematuria.  Musculoskeletal: Positive for back pain.   Allergies  Review of patient's allergies indicates no known allergies.  Home Medications   Prior to Admission medications   Medication Sig Start Date End Date Taking? Authorizing Provider  cetirizine (ZYRTEC) 10 MG tablet Take 20 mg by mouth 2 (two) times daily as needed for allergies.    Yes Historical Provider, MD  diphenhydrAMINE (BENADRYL)  25 mg capsule Take 50 mg by mouth as needed for allergies (2-3 times a day).    Yes Historical Provider, MD  naproxen sodium (ANAPROX) 220 MG tablet Take 880 mg by mouth 2 (two) times daily as needed (pain).   Yes Historical Provider, MD  methocarbamol (ROBAXIN) 500 MG tablet Take 1 tablet (500 mg total) by mouth 2 (two) times daily. 03/23/15   Shawn Pel, PA-C  oxyCODONE-acetaminophen (PERCOCET/ROXICET) 5-325 MG per tablet Take 1-2 tablets by mouth every 6 (six) hours as needed for severe pain. 03/23/15   Shawn Carducci Neva Seat, PA-C  predniSONE (DELTASONE) 10 MG tablet Take 2 tablets (20 mg total) by mouth daily. 03/23/15   Shawn Vater Neva Seat, PA-C   BP 141/96 mmHg  Temp(Src) 98.1 F (36.7 C) (Oral)  Resp 18  SpO2 99% Physical Exam  Constitutional: He is oriented to person, place, and time. He appears well-developed and well-nourished. No distress.  HENT:  Head: Normocephalic and atraumatic.  Eyes: EOM are normal.  Neck: Neck supple. No tracheal deviation present.  Cardiovascular: Normal rate.   Pulmonary/Chest: Effort normal. No respiratory distress.  Musculoskeletal: Normal range of motion.  Pt has equal strength to bilateral lower extremities.  Neurosensory function adequate to both legs Skin color is normal. Skin is warm and moist.  I see no step off deformity, no midline bony tenderness.  Pt is able to ambulate.  No crepitus, laceration, effusion, induration, lesions, swelling.   Pedal pulses are symmetrical and palpable bilaterally  tenderness to the right paraspinal muscle at T-12/L1 region. No midline tenderness.    Neurological: He is alert and oriented to person, place, and time.  Skin: Skin is warm and dry.  Psychiatric: He has a normal mood and affect. His behavior is normal.  Nursing note and vitals reviewed.   ED Course  Procedures (including critical care time)  COORDINATION OF CARE: 5:01 PM- Discussed treatment plan with patient at bedside and patient agreed to plan.    Labs Review Labs Reviewed - No data to display  Imaging Review No results found.   EKG Interpretation None      MDM   Final diagnoses:  Back strain, initial encounter    34 y.o.Shawn P Sherbert's  with back pain. No neurological deficits and normal neuro exam. Patient can walk. No loss of bowel or bladder control. No concern for cauda equina at this time base on HPI and physical exam findings. No fever, night sweats, weight loss, h/o cancer, IVDU.   Patient Plan 1. Medications: steroid dose pack and muscle relaxer. Cont usual home medications unless otherwise directed. 2. Treatment: rest, drink plenty of fluids, gentle stretching as discussed, alternate ice and heat  3. Follow Up: Please followup with your primary doctor for discussion of your diagnoses and further evaluation after today's visit; if you do not have a primary care doctor use the resource guide provided to find one  Advised to follow-up with the orthopedist if symptoms do not start to resolve in the next 2-3 days. If develop loss of bowel or urinary control return to the ED as soon as possible for further evaluation. To take the medications as prescribed as they can cause harm if not taken appropriately.   Vital signs are stable at discharge. Filed Vitals:   03/23/15 1622  BP: 141/96  Temp: 98.1 F (36.7 C)  Resp: 18    Patient/guardian has voiced understanding and agreed to follow-up with the PCP or specialist.       I personally performed the services described in this documentation, which was scribed in my presence. The recorded information has been reviewed and is accurate.    Shawn Pel, PA-C 03/23/15 1711  Blake Divine, MD 03/24/15 1345

## 2015-06-27 ENCOUNTER — Emergency Department (HOSPITAL_COMMUNITY)
Admission: EM | Admit: 2015-06-27 | Discharge: 2015-06-27 | Discharge status: Against Medical Advice | Payer: No Typology Code available for payment source

## 2015-06-27 NOTE — ED Notes (Signed)
Pt called to fast track room without answer.

## 2015-06-27 NOTE — ED Notes (Signed)
Called no answer

## 2015-09-30 ENCOUNTER — Inpatient Hospital Stay (HOSPITAL_COMMUNITY)
Admission: EM | Admit: 2015-09-30 | Discharge: 2015-10-03 | DRG: 638 | Disposition: A | Discharge status: Home / Self Care | Payer: Self-pay | Attending: Internal Medicine | Admitting: Internal Medicine

## 2015-09-30 ENCOUNTER — Encounter (HOSPITAL_COMMUNITY): Payer: Self-pay | Admitting: Emergency Medicine

## 2015-09-30 DIAGNOSIS — E1110 Type 2 diabetes mellitus with ketoacidosis without coma: Secondary | ICD-10-CM

## 2015-09-30 DIAGNOSIS — E669 Obesity, unspecified: Secondary | ICD-10-CM | POA: Diagnosis present

## 2015-09-30 DIAGNOSIS — L03311 Cellulitis of abdominal wall: Secondary | ICD-10-CM | POA: Diagnosis present

## 2015-09-30 DIAGNOSIS — Z833 Family history of diabetes mellitus: Secondary | ICD-10-CM

## 2015-09-30 DIAGNOSIS — L0291 Cutaneous abscess, unspecified: Secondary | ICD-10-CM

## 2015-09-30 DIAGNOSIS — Z6841 Body Mass Index (BMI) 40.0 and over, adult: Secondary | ICD-10-CM

## 2015-09-30 DIAGNOSIS — R739 Hyperglycemia, unspecified: Secondary | ICD-10-CM

## 2015-09-30 DIAGNOSIS — F17210 Nicotine dependence, cigarettes, uncomplicated: Secondary | ICD-10-CM | POA: Diagnosis present

## 2015-09-30 DIAGNOSIS — E1310 Other specified diabetes mellitus with ketoacidosis without coma: Principal | ICD-10-CM | POA: Diagnosis present

## 2015-09-30 DIAGNOSIS — L0390 Cellulitis, unspecified: Secondary | ICD-10-CM

## 2015-09-30 DIAGNOSIS — E0810 Diabetes mellitus due to underlying condition with ketoacidosis without coma: Secondary | ICD-10-CM

## 2015-09-30 LAB — BASIC METABOLIC PANEL
Anion gap: 12 (ref 5–15)
BUN: 13 mg/dL (ref 6–20)
CALCIUM: 8.8 mg/dL — AB (ref 8.9–10.3)
CO2: 26 mmol/L (ref 22–32)
CREATININE: 1.02 mg/dL (ref 0.61–1.24)
Chloride: 101 mmol/L (ref 101–111)
GFR calc Af Amer: >60 mL/min (ref >60)
GLUCOSE: 432 mg/dL — AB (ref 65–99)
POTASSIUM: 4.5 mmol/L (ref 3.5–5.1)
SODIUM: 139 mmol/L (ref 135–145)

## 2015-09-30 LAB — BLOOD GAS, VENOUS
ACID-BASE DEFICIT: 3 mmol/L — AB (ref 0.0–2.0)
Bicarbonate: 21.1 mEq/L (ref 20.0–24.0)
Drawn by: 295031
FIO2: 0.21
O2 SAT: 96.8 %
PCO2 VEN: 36.4 mmHg — AB (ref 45.0–50.0)
PH VEN: 7.381 — AB (ref 7.250–7.300)
Patient temperature: 98.6
TCO2: 18.7 mmol/L (ref 0–100)
pO2, Ven: 93.4 mmHg — ABNORMAL HIGH (ref 30.0–45.0)

## 2015-09-30 LAB — URINALYSIS, ROUTINE W REFLEX MICROSCOPIC
BILIRUBIN URINE: NEGATIVE
Leukocytes, UA: NEGATIVE
NITRITE: NEGATIVE
PH: 5 (ref 5.0–8.0)
Protein, ur: NEGATIVE mg/dL
Specific Gravity, Urine: 1.037 — ABNORMAL HIGH (ref 1.005–1.030)

## 2015-09-30 LAB — COMPREHENSIVE METABOLIC PANEL
ALBUMIN: 4.1 g/dL (ref 3.5–5.0)
ALT: 23 U/L (ref 17–63)
ANION GAP: 15 (ref 5–15)
AST: 16 U/L (ref 15–41)
Alkaline Phosphatase: 114 U/L (ref 38–126)
BILIRUBIN TOTAL: 1.2 mg/dL (ref 0.3–1.2)
BUN: 15 mg/dL (ref 6–20)
CHLORIDE: 94 mmol/L — AB (ref 101–111)
CO2: 23 mmol/L (ref 22–32)
Calcium: 8.9 mg/dL (ref 8.9–10.3)
Creatinine, Ser: 1.05 mg/dL (ref 0.61–1.24)
GFR calc Af Amer: >60 mL/min (ref >60)
GFR calc non Af Amer: >60 mL/min (ref >60)
GLUCOSE: 816 mg/dL — AB (ref 65–99)
POTASSIUM: 4.3 mmol/L (ref 3.5–5.1)
Sodium: 132 mmol/L — ABNORMAL LOW (ref 135–145)
TOTAL PROTEIN: 8 g/dL (ref 6.5–8.1)

## 2015-09-30 LAB — GLUCOSE, CAPILLARY
GLUCOSE-CAPILLARY: 195 mg/dL — AB (ref 65–99)
GLUCOSE-CAPILLARY: 339 mg/dL — AB (ref 65–99)
GLUCOSE-CAPILLARY: 353 mg/dL — AB (ref 65–99)
Glucose-Capillary: 267 mg/dL — ABNORMAL HIGH (ref 65–99)
Glucose-Capillary: 240 mg/dL — ABNORMAL HIGH (ref 65–99)
Glucose-Capillary: 212 mg/dL — ABNORMAL HIGH (ref 65–99)

## 2015-09-30 LAB — CBC
HEMATOCRIT: 41.1 % (ref 39.0–52.0)
HEMOGLOBIN: 13.3 g/dL (ref 13.0–17.0)
MCH: 25.7 pg — ABNORMAL LOW (ref 26.0–34.0)
MCHC: 32.4 g/dL (ref 30.0–36.0)
MCV: 79.3 fL (ref 78.0–100.0)
Platelets: 290 10*3/uL (ref 150–400)
RBC: 5.18 MIL/uL (ref 4.22–5.81)
RDW: 13.8 % (ref 11.5–15.5)
WBC: 12.8 10*3/uL — AB (ref 4.0–10.5)

## 2015-09-30 LAB — URINE MICROSCOPIC-ADD ON
BACTERIA UA: NONE SEEN
SQUAMOUS EPITHELIAL / LPF: NONE SEEN

## 2015-09-30 LAB — CBG MONITORING, ED
GLUCOSE-CAPILLARY: 439 mg/dL — AB (ref 65–99)
Glucose-Capillary: >600 mg/dL (ref 65–99)
Glucose-Capillary: 477 mg/dL — ABNORMAL HIGH (ref 65–99)

## 2015-09-30 MED ORDER — SULFAMETHOXAZOLE-TRIMETHOPRIM 800-160 MG PO TABS
1.0000 | ORAL_TABLET | Freq: Two times a day (BID) | ORAL | Status: DC
  Administered 2015-09-30 – 2015-10-03 (×6): 1 via ORAL
  Filled 2015-09-30 (×6): qty 1

## 2015-09-30 MED ORDER — ACETAMINOPHEN 325 MG PO TABS
650.0000 mg | ORAL_TABLET | Freq: Four times a day (QID) | ORAL | Status: DC | PRN
  Administered 2015-10-01: 650 mg via ORAL
  Filled 2015-09-30 (×2): qty 2

## 2015-09-30 MED ORDER — SODIUM CHLORIDE 0.9 % IV SOLN
INTRAVENOUS | Status: AC
  Administered 2015-09-30: 16:00:00 via INTRAVENOUS

## 2015-09-30 MED ORDER — HEPARIN SODIUM (PORCINE) 5000 UNIT/ML IJ SOLN
5000.0000 [IU] | Freq: Three times a day (TID) | INTRAMUSCULAR | Status: DC
  Administered 2015-09-30 – 2015-10-03 (×8): 5000 [IU] via SUBCUTANEOUS
  Filled 2015-09-30 (×10): qty 1

## 2015-09-30 MED ORDER — LIDOCAINE-EPINEPHRINE (PF) 2 %-1:200000 IJ SOLN
10.0000 mL | Freq: Once | INTRAMUSCULAR | Status: AC
  Administered 2015-09-30: 10 mL

## 2015-09-30 MED ORDER — SODIUM CHLORIDE 0.9 % IV SOLN
INTRAVENOUS | Status: DC
  Administered 2015-09-30: 7.6 [IU]/h via INTRAVENOUS
  Administered 2015-10-01: 13:00:00 via INTRAVENOUS
  Filled 2015-09-30 (×2): qty 2.5

## 2015-09-30 MED ORDER — INSULIN REGULAR HUMAN 100 UNIT/ML IJ SOLN
INTRAMUSCULAR | Status: DC
  Administered 2015-09-30: 4.2 [IU]/h via INTRAVENOUS
  Filled 2015-09-30: qty 2.5

## 2015-09-30 MED ORDER — POTASSIUM CHLORIDE 10 MEQ/100ML IV SOLN
10.0000 meq | INTRAVENOUS | Status: AC
  Filled 2015-09-30 (×2): qty 100

## 2015-09-30 MED ORDER — DEXTROSE-NACL 5-0.45 % IV SOLN
INTRAVENOUS | Status: DC
  Administered 2015-09-30 – 2015-10-01 (×2): via INTRAVENOUS

## 2015-09-30 MED ORDER — SODIUM CHLORIDE 0.9 % IV SOLN: INTRAVENOUS | Status: DC

## 2015-09-30 MED ORDER — ACETAMINOPHEN 650 MG RE SUPP: 650.0000 mg | Freq: Four times a day (QID) | RECTAL | Status: DC | PRN

## 2015-09-30 MED ORDER — LIDOCAINE HCL 2 % IJ SOLN
INTRAMUSCULAR | Status: AC
  Filled 2015-09-30: qty 20

## 2015-09-30 MED ORDER — DEXTROSE-NACL 5-0.45 % IV SOLN: INTRAVENOUS | Status: DC

## 2015-09-30 MED ORDER — SODIUM CHLORIDE 0.9 % IV BOLUS (SEPSIS)
1000.0000 mL | Freq: Once | INTRAVENOUS | Status: AC
  Administered 2015-09-30: 1000 mL via INTRAVENOUS

## 2015-09-30 NOTE — Progress Notes (Signed)
CM spoke with [place] who [place].  CM discussed and provided written information for uninsured accepting pcps, discussed the importance of pcp vs EDP services for f/u care, www.needymeds.org, www.goodrx.com, discounted pharmacies and other [place] resources such as [facility] , [facility], affordable care act, financial assistance, uninsured dental services,  med assist, DSS and  health department  Reviewed resources for [place] uninsured accepting pcps like [facility], [facility] [address], [facility], [facility], local urgent care centers, [facility], [facility] family [facility], general medical clinics, [facility], [facility] plus others, medication resources, [facility] out patient [facility]ies and housing Pt voiced understanding and appreciation of resources provided   Provided [facility] contact information Pt did not agreed to a referral Cm completed referral Pt informed CM he is familiar with the orange card but just "have not had the time to finish the application"

## 2015-09-30 NOTE — H&P (Signed)
[facility] History and Physical  Shawn Benson VHQ:[id number] DOB: [birth year] DOA: 09/30/2015  Referring physician: emergency department PCP: No primary care provider on file.  Specialists:   Chief Complaint: Polyuria  HPI: Shawn Benson is a 34 y.o. male with a hx of obesity presents to the ed with polyuria x 1 week prior to admission associated with nausea/vomiting, fatigue. +family hx of DM. In the ED, pt was found to have blood glucose of 816 with anion gap of 15. Patient started on insulin gtt and hospitalist consulted for consideration for admission.  Review of Systems:  Review of Systems  Constitutional: Positive for malaise/fatigue.  HENT: Negative for ear discharge, ear pain and tinnitus.   Eyes: Negative for pain and discharge.  Respiratory: Negative for sputum production and shortness of breath.   Cardiovascular: Negative for chest pain and orthopnea.  Gastrointestinal: Positive for nausea and vomiting.  Musculoskeletal: Negative for back pain, joint pain and neck pain.  Neurological: Positive for weakness. Negative for tremors, seizures and loss of consciousness.  Psychiatric/Behavioral: Negative for hallucinations and memory loss.      Past Medical History  Diagnosis Date  . Asthma    Past Surgical History  Procedure Laterality Date  . Knee surgery      Right and Left Knee   Social History:  reports that he has been smoking Cigarettes.  He has been smoking about 3.00 packs per day. He does not have any smokeless tobacco history on file. He reports that he uses illicit drugs (Marijuana). He reports that he does not drink alcohol.  where does patient live--home, ALF, SNF? and with whom if at home?  Can patient participate in ADLs?  No Known Allergies  History reviewed. No pertinent family history. DM  Prior to Admission medications   Medication Sig Start Date End Date Taking? Authorizing Provider  DM-Doxylamine-Acetaminophen (VICKS NYQUIL COLD &  FLU) 15-6.25-325 MG/15ML LIQD Take 30 mLs by mouth at bedtime as needed (for cold).   Yes Historical Provider, MD  DM-Phenylephrine-Acetaminophen (DAY TIME COLD/FLU RELIEF) 10-5-325 MG CAPS Take 2 tablets by mouth every 12 (twelve) hours as needed (for cold).   Yes Historical Provider, MD   Physical Exam: Filed Vitals:   09/30/15 1222 09/30/15 1417  BP: 156/89 121/70  Pulse: 80 75  Temp: 97.9 F (36.6 C) 98.2 F (36.8 C)  TempSrc: Oral Oral  Resp: 18 16  SpO2: 100% 96%     General:  Awake, laying in bed, in nad  Eyes: PERRL B  ENT: membranes dry, dentition fair  Neck: trachea midline, neck supple  Cardiovascular: regular, s1, s2  Respiratory: normal resp effort, no wheezing  Abdomen: soft, obese, nondistended  Skin: normal skin turgor  Musculoskeletal: perfused, no clubbing  Psychiatric: mood/affect normal// no auditory/visual hallucinations  Neurologic: cn2-12 grossly intact, strength/sensation intact  Labs on Admission:  Basic Metabolic Panel:  Recent Labs Lab 09/30/15 1258  NA 132*  K 4.3  CL 94*  CO2 23  GLUCOSE 816*  BUN 15  CREATININE 1.05  CALCIUM 8.9   Liver Function Tests:  Recent Labs Lab 09/30/15 1258  AST 16  ALT 23  ALKPHOS 114  BILITOT 1.2  PROT 8.0  ALBUMIN 4.1   No results for input(s): LIPASE, AMYLASE in the last 168 hours. No results for input(s): AMMONIA in the last 168 hours. CBC:  Recent Labs Lab 09/30/15 1255  WBC 12.8*  HGB 13.3  HCT 41.1  MCV 79.3  PLT 290   Cardiac  Enzymes: No results for input(s): CKTOTAL, CKMB, CKMBINDEX, TROPONINI in the last 168 hours.  BNP (last 3 results) No results for input(s): BNP in the last 8760 hours.  ProBNP (last 3 results) No results for input(s): PROBNP in the last 8760 hours.  CBG:  Recent Labs Lab 09/30/15 1227 09/30/15 1506  GLUCAP >600* 477*    Radiological Exams on Admission: No results found.   Assessment/Plan Principal Problem:   DKA (diabetic  ketoacidoses) (HCC) Active Problems:   Obesity  1. Early DKA with newly diagnosed DM2 1. New onset in setting of obesity, family hx of DM 2. Agree with insulin gtt and wean off when GAP closed and glucose improved 3. Will cont on IVF as tolerated 4. Consult diabetic coordinator 5. Admit to floor 2. Obesity 1. stable 3. DVT prophylaxis 1. Heparin subQ  Code Status: Full Family Communication: pt in room [facility] Plan[facility]   Masiel Gentzler K [facility] Pager [phone]  If 7PM-7AM, please contact night-coverage [email or web address] Password [email or web address] 09/30/2015, 3:37 PM

## 2015-09-30 NOTE — ED Notes (Signed)
2 weeks prior recently got over a URI, and he's had problems tasting since. Also complaining of a minor cough, occasional increase in acid reflux, no pain at this time. States he's been urinating more than normal over the last week, states he's also been more thirsty over the last week as well, and that no matter how much he drinks his mouth feels dry and he still feels thirsty. States he vomited twice yesterday. Denies nausea, diarrhea, chest pain, SOB

## 2015-09-30 NOTE — ED Notes (Signed)
Result given to edp and pa. glucose

## 2015-09-30 NOTE — ED Notes (Addendum)
Per Admitting. Pt fine to go to Med Surg floor Also, waiting until CBG below 250 to begin potassium.

## 2015-09-30 NOTE — ED Notes (Signed)
Delay in transport for PA to complete beside procedure.

## 2015-09-30 NOTE — ED Notes (Signed)
Urinal given

## 2015-09-30 NOTE — ED Notes (Signed)
Spoke with charge rn for patient to notify of changes.

## 2015-09-30 NOTE — ED Notes (Signed)
Two cups of water given to patient. Rn aware.

## 2015-09-30 NOTE — ED Provider Notes (Signed)
CSN: [id number]     Arrival date & time 09/30/15  1209 History   First MD Initiated Contact with Patient 09/30/15 1234     Chief Complaint  Patient presents with  . Urinary Frequency  . Emesis  . Polydipsia     (Consider location/radiation/quality/duration/timing/severity/associated sxs/prior Treatment) The history is provided by the patient and medical records. No language interpreter was used.     Shawn Benson is a 34 y.o. male  with a hx of asthma presents to the Emergency Department complaining of gradual, persistent, progressively worsening polyuria, polydipsia onset 1 week ago. Associated symptoms include fatigue, decreased appetite, nausea and vomiting x2 yesterday.  Nothing makes it better and nothing makes it worse.  Pt denies fever, chills, headache, neck pain, chest pain, SOB, abd pain, diarrhea, weakness, dizziness, syncope, weight loss, night sweats.  Pt denies a hx of diabetes but reports family hx of same.  Pt reports he normally eats a lot of sweets, but recently has not wanted as many.  Pt reports that 2 weeks ago he had flu like symptoms with fatigue, cough.     Past Medical History  Diagnosis Date  . Asthma    Past Surgical History  Procedure Laterality Date  . Knee surgery      Right and Left Knee   History reviewed. No pertinent family history. Social History  Substance Use Topics  . Smoking status: Current Every Day Smoker -- 3.00 packs/day    Types: Cigarettes  . Smokeless tobacco: None  . Alcohol Use: No    Review of Systems  Constitutional: Positive for appetite change and fatigue. Negative for fever, diaphoresis and unexpected weight change.  HENT: Negative for mouth sores.   Eyes: Negative for visual disturbance.  Respiratory: Negative for cough, chest tightness, shortness of breath and wheezing.   Cardiovascular: Negative for chest pain.  Gastrointestinal: Positive for vomiting (x2 yesterday). Negative for nausea, abdominal pain, diarrhea  and constipation.  Endocrine: Positive for polydipsia and polyuria. Negative for polyphagia.  Genitourinary: Negative for dysuria, urgency, frequency and hematuria.  Musculoskeletal: Negative for back pain and neck stiffness.  Skin: Negative for rash.  Allergic/Immunologic: Negative for immunocompromised state.  Neurological: Negative for syncope, light-headedness and headaches.  Hematological: Does not bruise/bleed easily.  Psychiatric/Behavioral: Negative for sleep disturbance. The patient is not nervous/anxious.       Allergies  Review of patient's allergies indicates no known allergies.  Home Medications   Prior to Admission medications   Medication Sig Start Date End Date Taking? Authorizing Provider  DM-Doxylamine-Acetaminophen (VICKS NYQUIL COLD & FLU) 15-6.25-325 MG/15ML LIQD Take 30 mLs by mouth at bedtime as needed (for cold).   Yes Historical Provider, MD  DM-Phenylephrine-Acetaminophen (DAY TIME COLD/FLU RELIEF) 10-5-325 MG CAPS Take 2 tablets by mouth every 12 (twelve) hours as needed (for cold).   Yes Historical Provider, MD   BP 121/70 mmHg  Pulse 75  Temp(Src) 98.2 F (36.8 C) (Oral)  Resp 16  SpO2 96% Physical Exam  Constitutional: He appears well-developed and well-nourished. No distress.  Awake, alert, nontoxic appearance Obese  HENT:  Head: Normocephalic and atraumatic.  Mouth/Throat: Oropharynx is clear and moist. Mucous membranes are dry. No oropharyngeal exudate.  Eyes: Conjunctivae are normal. No scleral icterus.  Neck: Normal range of motion. Neck supple.  Cardiovascular: Normal rate, regular rhythm, normal heart sounds and intact distal pulses.   No murmur heard. Pulmonary/Chest: Effort normal and breath sounds normal. No respiratory distress. He has no wheezes.  Equal chest  expansion  Abdominal: Soft. Bowel sounds are normal. He exhibits no mass. There is no tenderness. There is no rebound and no guarding.  Musculoskeletal: Normal range of motion.  He exhibits no edema.  Neurological: He is alert.  Speech is clear and goal oriented Moves extremities without ataxia  Skin: Skin is warm and dry. He is not diaphoretic. There is erythema.  Small area of erythema and superficial abscess left lower quadrant abdominal wall.   Psychiatric: He has a normal mood and affect.  Nursing note and vitals reviewed.   ED Course  .Marland KitchenIncision and Drainage Date/Time: 09/30/2015 3:57 PM Performed by: Dierdre Forth Authorized by: Dierdre Forth Consent: Verbal consent obtained. Risks and benefits: risks, benefits and alternatives were discussed Consent given by: patient Patient understanding: patient states understanding of the procedure being performed Patient consent: the patient's understanding of the procedure matches consent given Procedure consent: procedure consent matches procedure scheduled Relevant documents: relevant documents present and verified Site marked: the operative site was marked Required items: required blood products, implants, devices, and special equipment available Patient identity confirmed: verbally with patient and arm band Time out: Immediately prior to procedure a "time out" was called to verify the correct patient, procedure, equipment, support staff and site/side marked as required. Type: abscess Body area: trunk Location details: abdomen Anesthesia: local infiltration Local anesthetic: lidocaine 2% with epinephrine Anesthetic total: 5 ml Patient sedated: no Scalpel size: 11 Incision type: single straight Complexity: simple Drainage: purulent Drainage amount: moderate Wound treatment: wound left open Packing material: none Patient tolerance: Patient tolerated the procedure well with no immediate complications   (including critical care time) Labs Review Labs Reviewed  CBC - Abnormal; Notable for the following:    WBC 12.8 (*)    MCH 25.7 (*)    All other components within normal limits   URINALYSIS, ROUTINE W REFLEX MICROSCOPIC (NOT AT [facility]) - Abnormal; Notable for the following:    Specific Gravity, Urine 1.037 (*)    Glucose, UA >1000 (*)    Hgb urine dipstick TRACE (*)    Ketones, ur >80 (*)    All other components within normal limits  BLOOD GAS, VENOUS - Abnormal; Notable for the following:    pH, Ven 7.381 (*)    pCO2, Ven 36.4 (*)    pO2, Ven 93.4 (*)    Acid-base deficit 3.0 (*)    All other components within normal limits  COMPREHENSIVE METABOLIC PANEL - Abnormal; Notable for the following:    Sodium 132 (*)    Chloride 94 (*)    Glucose, Bld 816 (*)    All other components within normal limits  URINE MICROSCOPIC-ADD ON - Abnormal; Notable for the following:    Casts GRANULAR CAST (*)    All other components within normal limits  CBG MONITORING, ED - Abnormal; Notable for the following:    Glucose-Capillary >600 (*)    All other components within normal limits  CBG MONITORING, ED - Abnormal; Notable for the following:    Glucose-Capillary 477 (*)    All other components within normal limits  HEMOGLOBIN A1C     CRITICAL CARE Performed by: Dierdre Forth Total critical care time: 45 minutes Critical care time was exclusive of separately billable procedures and treating other patients. Critical care was necessary to treat or prevent imminent or life-threatening deterioration. Critical care was time spent personally by me on the following activities: development of treatment plan with patient and/or surrogate as well as nursing, discussions with consultants,  evaluation of patient's response to treatment, examination of patient, obtaining history from patient or surrogate, ordering and performing treatments and interventions, ordering and review of laboratory studies, ordering and review of radiographic studies, pulse oximetry and re-evaluation of patient's condition.   MDM   Final diagnoses:  Diabetic ketoacidosis without coma associated with  type 2 diabetes mellitus (HCC)  Hyperglycemia  Cellulitis and abscess   [place] presents with polyuria, polydipsia x 1 week ago.  Evidence of new onset diabetes with glucose of 816.  Patient is without acidosis and anion gap of 15. No evidence of UTI.  Glucose stabilizer initiated. Patient will need admission.    Abscess of the abdomen is very small.  No I&D at this time.  Patient discussed with [facility] hospitalist Dr. Rhona Leavenshiu will admit to MedSurg. Glucose stabilizer infusing.    BP 121/70 mmHg  Pulse 75  Temp(Src) 98.2 F (36.8 C) (Oral)  Resp 16  SpO2 96%    Dierdre ForthHannah Monee Dembeck, PA-C 09/30/15 1534   I&D of abdominal abscess in the ED without complication before transfer to the floor.    Dahlia ClientHannah Fleming Prill, PA-C 09/30/15 1705  Leta BaptistEmily Roe Nguyen, MD 10/04/15 [phone]

## 2015-09-30 NOTE — ED Notes (Signed)
Glucose >600, no previous diagnoses of diabetes, states it runs in his family, primary nurse notified

## 2015-10-01 LAB — GLUCOSE, CAPILLARY
GLUCOSE-CAPILLARY: 131 mg/dL — AB (ref 65–99)
GLUCOSE-CAPILLARY: 146 mg/dL — AB (ref 65–99)
GLUCOSE-CAPILLARY: 150 mg/dL — AB (ref 65–99)
GLUCOSE-CAPILLARY: 174 mg/dL — AB (ref 65–99)
GLUCOSE-CAPILLARY: 189 mg/dL — AB (ref 65–99)
GLUCOSE-CAPILLARY: 198 mg/dL — AB (ref 65–99)
GLUCOSE-CAPILLARY: 222 mg/dL — AB (ref 65–99)
Glucose-Capillary: 183 mg/dL — ABNORMAL HIGH (ref 65–99)
Glucose-Capillary: 189 mg/dL — ABNORMAL HIGH (ref 65–99)
Glucose-Capillary: 150 mg/dL — ABNORMAL HIGH (ref 65–99)
Glucose-Capillary: 224 mg/dL — ABNORMAL HIGH (ref 65–99)
Glucose-Capillary: 231 mg/dL — ABNORMAL HIGH (ref 65–99)
Glucose-Capillary: 196 mg/dL — ABNORMAL HIGH (ref 65–99)
Glucose-Capillary: 214 mg/dL — ABNORMAL HIGH (ref 65–99)
Glucose-Capillary: 136 mg/dL — ABNORMAL HIGH (ref 65–99)
Glucose-Capillary: 125 mg/dL — ABNORMAL HIGH (ref 65–99)
Glucose-Capillary: 167 mg/dL — ABNORMAL HIGH (ref 65–99)

## 2015-10-01 LAB — BASIC METABOLIC PANEL
ANION GAP: 12 (ref 5–15)
BUN: 13 mg/dL (ref 6–20)
CHLORIDE: 102 mmol/L (ref 101–111)
CO2: 28 mmol/L (ref 22–32)
Calcium: 9.4 mg/dL (ref 8.9–10.3)
Creatinine, Ser: 0.83 mg/dL (ref 0.61–1.24)
GFR calc Af Amer: >60 mL/min (ref >60)
GLUCOSE: 226 mg/dL — AB (ref 65–99)
POTASSIUM: 3.9 mmol/L (ref 3.5–5.1)
Sodium: 142 mmol/L (ref 135–145)

## 2015-10-01 LAB — CBC
HCT: 39.3 % (ref 39.0–52.0)
Hemoglobin: 12.8 g/dL — ABNORMAL LOW (ref 13.0–17.0)
MCH: 25.6 pg — ABNORMAL LOW (ref 26.0–34.0)
MCHC: 32.6 g/dL (ref 30.0–36.0)
MCV: 78.6 fL (ref 78.0–100.0)
PLATELETS: 271 10*3/uL (ref 150–400)
RBC: 5 MIL/uL (ref 4.22–5.81)
RDW: 14 % (ref 11.5–15.5)
WBC: 12.8 10*3/uL — AB (ref 4.0–10.5)

## 2015-10-01 LAB — COMPREHENSIVE METABOLIC PANEL
ALK PHOS: 90 U/L (ref 38–126)
ALT: 20 U/L (ref 17–63)
AST: 16 U/L (ref 15–41)
Albumin: 3.6 g/dL (ref 3.5–5.0)
Anion gap: 9 (ref 5–15)
BILIRUBIN TOTAL: 1 mg/dL (ref 0.3–1.2)
BUN: 12 mg/dL (ref 6–20)
CALCIUM: 9 mg/dL (ref 8.9–10.3)
CHLORIDE: 105 mmol/L (ref 101–111)
CO2: 25 mmol/L (ref 22–32)
CREATININE: 0.86 mg/dL (ref 0.61–1.24)
Glucose, Bld: 205 mg/dL — ABNORMAL HIGH (ref 65–99)
Potassium: 3.3 mmol/L — ABNORMAL LOW (ref 3.5–5.1)
Sodium: 139 mmol/L (ref 135–145)
Total Protein: 7.3 g/dL (ref 6.5–8.1)

## 2015-10-01 LAB — HEMOGLOBIN A1C
Hgb A1c MFr Bld: 10.5 % — ABNORMAL HIGH (ref 4.8–5.6)
Mean Plasma Glucose: 255 mg/dL

## 2015-10-01 MED ORDER — LIVING WELL WITH DIABETES BOOK
Freq: Once | Status: AC
  Administered 2015-10-01: 16:00:00
  Filled 2015-10-01 (×2): qty 1

## 2015-10-01 MED ORDER — INSULIN ASPART 100 UNIT/ML ~~LOC~~ SOLN
0.0000 [IU] | Freq: Three times a day (TID) | SUBCUTANEOUS | Status: DC
  Administered 2015-10-01: 2 [IU] via SUBCUTANEOUS
  Administered 2015-10-02: 15 [IU] via SUBCUTANEOUS
  Administered 2015-10-02: 11 [IU] via SUBCUTANEOUS
  Administered 2015-10-02: 8 [IU] via SUBCUTANEOUS
  Administered 2015-10-03: 11 [IU] via SUBCUTANEOUS
  Administered 2015-10-03: 15 [IU] via SUBCUTANEOUS
  Administered 2015-10-03: 8 [IU] via SUBCUTANEOUS

## 2015-10-01 MED ORDER — INSULIN GLARGINE 100 UNIT/ML ~~LOC~~ SOLN
18.0000 [IU] | Freq: Every day | SUBCUTANEOUS | Status: DC
  Administered 2015-10-01: 18 [IU] via SUBCUTANEOUS
  Filled 2015-10-01 (×2): qty 0.18

## 2015-10-01 MED ORDER — METFORMIN HCL 500 MG PO TABS
500.0000 mg | ORAL_TABLET | Freq: Two times a day (BID) | ORAL | Status: DC
  Administered 2015-10-02 – 2015-10-03 (×4): 500 mg via ORAL
  Filled 2015-10-01 (×4): qty 1

## 2015-10-01 MED ORDER — INSULIN ASPART 100 UNIT/ML ~~LOC~~ SOLN
0.0000 [IU] | Freq: Every day | SUBCUTANEOUS | Status: DC
  Administered 2015-10-01: 4 [IU] via SUBCUTANEOUS
  Administered 2015-10-02: 3 [IU] via SUBCUTANEOUS

## 2015-10-01 MED ORDER — SODIUM CHLORIDE 0.9 % IV SOLN
INTRAVENOUS | Status: DC
  Administered 2015-10-01 – 2015-10-02 (×2): via INTRAVENOUS

## 2015-10-01 NOTE — Progress Notes (Addendum)
Patient educated on checking own blood sugar at home using a meter. This RN demonstrated technique to pt and patient then performed a return demonstration by pricking his own finger to obtain a blood sugar reading using hospital meter.  Patient was able to perform task correctly, expressed understanding of reason to check blood sugar at home and stated that he did not have any questions at this time. Patient was also provided with "living well with diabetes" booklet and RN put diabetes education videos on TV for pt to watch.  At time of this note patient had viewed videos 501-509.  Will continue to monitor.

## 2015-10-01 NOTE — Progress Notes (Signed)
[facility] PROGRESS NOTE  Shawn Benson ZOX:[id number] DOB: [birth year] DOA: 09/30/2015 PCP: No primary care provider on file.  HPI/Brief narrative 34 y.o. male with a hx of obesity presents to the ed with polyuria x 1 week prior to admission associated with nausea/vomiting, fatigue. +family hx of DM. In the ED, pt was found to have blood glucose of 816 with anion gap of 15. Patient started on insulin gtt and hospitalist consulted for consideration for admission  Assessment/Plan: 1. Early DKA with newly diagnosed DM2 1. New onset in setting of obesity, family hx of DM 2. Glucose improved and GAP closed with insulin gtt and IVF 3. Consulted diabetic coordinator and appreciate recs. Recommendations to start on 18 units of lantus with SSI. On discharge, recs for metformin with sulfonylurea 4. Will transition off insulin gtt 2. Obesity 1. stable 3. DVT prophylaxis 1. Heparin subQ  Code Status: Full Family Communication: Pt in room Disposition Plan: Possible d/c home 12/25 if glucose stable   Consultants:  Diabetic coordinator  Procedures:    Antibiotics: Anti-infectives    Start     Dose/Rate Route Frequency Ordered Stop   09/30/15 2200  sulfamethoxazole-trimethoprim (BACTRIM DS,SEPTRA DS) 800-160 MG per tablet 1 tablet     1 tablet Oral Every 12 hours 09/30/15 1555        HPI/Subjective: Wants to eat  Objective: Filed Vitals:   09/30/15 1732 09/30/15 2053 10/01/15 0613 10/01/15 1320  BP: 130/77 144/75 136/72 135/79  Pulse: 74 81 71 85  Temp: 98.2 F (36.8 C) 98.2 F (36.8 C) 98.4 F (36.9 C) 98 F (36.7 C)  TempSrc: Oral Oral Oral Oral  Resp: SpO2: 98% 98% 96% 99%    Intake/Output Summary (Last 24 hours) at 10/01/15 1656 Last data filed at 10/01/15 1328  Gross per 24 hour  Intake 1053.48 ml  Output      0 ml  Net 1053.48 ml   There were no vitals filed for this visit.  Exam:   General:  Awake, in nad  Cardiovascular: regular,  s1,s 2  Respiratory: normal resp effort, no wheezing  Abdomen: soft,nondstended  Musculoskeletal: perfused, no clubbing   Data Reviewed: Basic Metabolic Panel:  Recent Labs Lab 09/30/15 0003 09/30/15 1258 09/30/15 1711 10/01/15 0424  NA 142 132* 139 139  Benson 3.9 4.3 4.5 3.3*  CL 102 94* 101 105  CO2 GLUCOSE 226* 816* 432* 205*  BUN CREATININE 0.83 1.05 1.02 0.86  CALCIUM 9.4 8.9 8.8* 9.0   Liver Function Tests:  Recent Labs Lab 09/30/15 1258 10/01/15 0424  AST 16 16  ALT 23 20  ALKPHOS 114 90  BILITOT 1.2 1.0  PROT 8.0 7.3  ALBUMIN 4.1 3.6   No results for input(s): LIPASE, AMYLASE in the last 168 hours. No results for input(s): AMMONIA in the last 168 hours. CBC:  Recent Labs Lab 09/30/15 1255 10/01/15 0424  WBC 12.8* 12.8*  HGB 13.3 12.8*  HCT 41.1 39.3  MCV 79.3 78.6  PLT 290 271   Cardiac Enzymes: No results for input(s): CKTOTAL, CKMB, CKMBINDEX, TROPONINI in the last 168 hours. BNP (last 3 results) No results for input(s): BNP in the last 8760 hours.  ProBNP (last 3 results) No results for input(s): PROBNP in the last 8760 hours.  CBG:  Recent Labs Lab 10/01/15 0254 10/01/15 0446 10/01/15 0606 10/01/15 0720 10/01/15 0859  GLUCAP 183* 189* 189* 150* 224*  No results found for this or any previous visit (from the past 240 hour(s)).   Studies: No results found.  Scheduled Meds: . heparin  5,000 Units Subcutaneous 3 times per day  . insulin aspart  0-15 Units Subcutaneous TID WC  . insulin aspart  0-5 Units Subcutaneous QHS  . insulin glargine  18 Units Subcutaneous Daily  . living well with diabetes book   Does not apply Once  . [START ON 10/02/2015] metFORMIN  500 mg Oral BID WC  . sulfamethoxazole-trimethoprim  1 tablet Oral Q12H   Continuous Infusions: . dextrose 5 % and 0.45% NaCl 100 mL/hr at 10/01/15 1009  . insulin (NOVOLIN-R) infusion 5.1 Units/hr (10/01/15 1634)    Principal Problem:    DKA (diabetic ketoacidoses) (HCC) Active Problems:   Obesity   Shawn Benson  [facility] Pager [phone]. If 7PM-7AM, please contact night-coverage at [email or web address], password [email or web address] 10/01/2015, 4:56 PM  LOS: 1 day

## 2015-10-01 NOTE — Progress Notes (Signed)
Inpatient Diabetes Program Recommendations  AACE/ADA: New Consensus Statement on Inpatient Glycemic Control (2015)  Target Ranges:  Prepandial:   less than 140 mg/dL      Peak postprandial:   less than 180 mg/dL (1-2 hours)      Critically ill patients:  140 - 180 mg/dL   Results for [place], Shawn Benson (MRN [id number]) as of 10/01/2015 15:34  Ref. Range 09/30/2015 12:55  Hemoglobin A1C Latest Ref Range: 4.8-5.6 % 10.5 (H)    Admit with: New Diagnosis of DM   Current Insulin Orders: IV Insulin drip    -Patient admitted with Hyperglycemia, New diagnosis of DM.  Glucose 816 mg/dl on admission.  -Note patient has strong family history of DM.  -Will have RNs begin basic DM teaching with patient asap.    -Note patient does Not have insurance, nor PCP.  Will need affordable meds at time of d/c.  -Ordered Living Well with DM booklet, RD consult for diet education, DM videos, CBG meter teaching.     MD- When patient ready to transition off IV insulin drip, please make sure he gets Lantus 1-2 hours before IV insulin drip stopped.  Recommend Lantus 18 units daily (0.1 units/kg dosing) and Novolog Moderate SSI (0-15 units) TID AC + HS  May also want to initiate Metformin 500 mg bid.  Based on A1c of 10.5%, patient may do OK on Metformin and Sulfonylurea drug like Amaryl at time of discharge.  Not sure if he will need insulin.  If patient does need insulin, please keep cost considerations in mind.  Reli-on 70/30 insulin from [facility]     --Will follow patient during hospitalization--  Ambrose FinlandJeannine Johnston Joeanna Howdyshell RN, MSN, CDE Diabetes Coordinator Inpatient Glycemic Control Team Team Pager: [phone] (8a-5p)

## 2015-10-02 LAB — BASIC METABOLIC PANEL
Anion gap: 9 (ref 5–15)
BUN: 10 mg/dL (ref 6–20)
CALCIUM: 8.7 mg/dL — AB (ref 8.9–10.3)
CO2: 26 mmol/L (ref 22–32)
CREATININE: 0.9 mg/dL (ref 0.61–1.24)
Chloride: 103 mmol/L (ref 101–111)
GFR calc non Af Amer: >60 mL/min (ref >60)
Glucose, Bld: 333 mg/dL — ABNORMAL HIGH (ref 65–99)
Potassium: 4.2 mmol/L (ref 3.5–5.1)
Sodium: 138 mmol/L (ref 135–145)

## 2015-10-02 LAB — GLUCOSE, CAPILLARY
GLUCOSE-CAPILLARY: 287 mg/dL — AB (ref 65–99)
GLUCOSE-CAPILLARY: 371 mg/dL — AB (ref 65–99)
Glucose-Capillary: 328 mg/dL — ABNORMAL HIGH (ref 65–99)
Glucose-Capillary: 302 mg/dL — ABNORMAL HIGH (ref 65–99)
Glucose-Capillary: 314 mg/dL — ABNORMAL HIGH (ref 65–99)
Glucose-Capillary: 290 mg/dL — ABNORMAL HIGH (ref 65–99)

## 2015-10-02 MED ORDER — INSULIN ASPART PROT & ASPART (70-30 MIX) 100 UNIT/ML ~~LOC~~ SUSP
20.0000 [IU] | Freq: Two times a day (BID) | SUBCUTANEOUS | Status: DC
  Administered 2015-10-02: 20 [IU] via SUBCUTANEOUS
  Filled 2015-10-02: qty 10

## 2015-10-02 MED ORDER — GLIMEPIRIDE 4 MG PO TABS
4.0000 mg | ORAL_TABLET | Freq: Every day | ORAL | Status: DC
  Administered 2015-10-02: 4 mg via ORAL
  Filled 2015-10-02 (×2): qty 1

## 2015-10-02 MED ORDER — INSULIN ASPART 100 UNIT/ML ~~LOC~~ SOLN
4.0000 [IU] | Freq: Once | SUBCUTANEOUS | Status: AC
  Administered 2015-10-02: 4 [IU] via SUBCUTANEOUS

## 2015-10-02 NOTE — Progress Notes (Addendum)
[facility] PROGRESS NOTE  Shawn Benson DOB: Mar 29, 1981 DOA: 09/30/2015 PCP: No primary care provider on file.  HPI/Brief narrative 34 y.o. male with a hx of obesity presents to the ed with polyuria x 1 week prior to admission associated with nausea/vomiting, fatigue. +family hx of DM. In the ED, pt was found to have blood glucose of 816 with anion gap of 15. Patient started on insulin gtt and hospitalist consulted for consideration for admission  Assessment/Plan: 1. Early DKA with newly diagnosed DM2 1. New onset in setting of obesity, family hx of DM 2. A1c of 10.5 3. Glucose improved and GAP closed with insulin gtt and IVF 4. Consulted diabetic coordinator and appreciate recs. Had started metformin with sulfonylurea per recs 5. Glucose remains just over 370. Discussed with Diabetic Coordinator. Plan to either continue metformin with amaryl with very close outpt follow up or begin 70/30 insulin starting at 20 units BID. 6. Met with patient and he chooses to start 70/30 insulin. Orders placed 2. Obesity 1. Stable 2. Wt loss recommended 3. DVT prophylaxis 1. Heparin subQ  Code Status: Full Family Communication: Pt in room Disposition Plan: Possible d/c home 12/26 if glucose stable on insulin   Consultants:  Diabetic coordinator  Procedures:    Antibiotics: Anti-infectives    Start     Dose/Rate Route Frequency Ordered Stop   09/30/15 2200  sulfamethoxazole-trimethoprim (BACTRIM DS,SEPTRA DS) 800-160 MG per tablet 1 tablet     1 tablet Oral Every 12 hours 09/30/15 1555        HPI/Subjective: Feels better today  Objective: Filed Vitals:   10/01/15 1320 10/01/15 2053 10/02/15 0522 10/02/15 1428  BP: 135/79 120/69 118/53 140/90  Pulse: 85 65 68 82  Temp: 98 F (36.7 C) 98.8 F (37.1 C) 97.9 F (36.6 C) 98.5 F (36.9 C)  TempSrc: Oral Oral Oral Oral  Resp: _0 SpO2: 99% 100% 97% 91%    Intake/Output Summary (Last 24 hours) at  10/02/15 1435 Last data filed at 10/02/15 1300  Gross per 24 hour  Intake 776.25 ml  Output    825 ml  Net -48.75 ml   There were no vitals filed for this visit.  Exam:   General:  Awake, in nad  Cardiovascular: regular, s1,s 2  Respiratory: normal resp effort, no wheezing  Abdomen: soft,nondstended  Musculoskeletal: perfused, no clubbing, no cyanosis  Data Reviewed: Basic Metabolic Panel:  Recent Labs Lab 09/30/15 0003 09/30/15 1258 09/30/15 1711 10/01/15 0424 10/02/15 0415  NA 142 132* 139 139 138  K 3.9 4.3 4.5 3.3* 4.2  CL 102 94* 101 105 103  CO2 _1 GLUCOSE 226* 816* 432* 205* 333*  BUN _2 CREATININE 0.83 1.05 1.02 0.86 0.90  CALCIUM 9.4 8.9 8.8* 9.0 8.7*   Liver Function Tests:  Recent Labs Lab 09/30/15 1258 10/01/15 0424  AST 16 16  ALT 23 20  ALKPHOS 114 90  BILITOT 1.2 1.0  PROT 8.0 7.3  ALBUMIN 4.1 3.6   No results for input(s): LIPASE, AMYLASE in the last 168 hours. No results for input(s): AMMONIA in the last 168 hours. CBC:  Recent Labs Lab 09/30/15 1255 10/01/15 0424  WBC 12.8* 12.8*  HGB 13.3 12.8*  HCT 41.1 39.3  MCV 79.3 78.6  PLT 290 271   Cardiac Enzymes: No results for input(s): CKTOTAL, CKMB, CKMBINDEX, TROPONINI in the last 168 hours. BNP (last 3 results) No  results for input(s): BNP in the last 8760 hours.  ProBNP (last 3 results) No results for input(s): PROBNP in the last 8760 hours.  CBG:  Recent Labs Lab 10/01/15 1906 10/01/15 2142 10/02/15 0521 10/02/15 0736 10/02/15 1223  GLUCAP 167* 328* 302* 287* 371*    No results found for this or any previous visit (from the past 240 hour(s)).   Studies: No results found.  Scheduled Meds: . heparin  5,000 Units Subcutaneous 3 times per day  . insulin aspart  0-15 Units Subcutaneous TID WC  . insulin aspart  0-5 Units Subcutaneous QHS  . insulin aspart protamine- aspart  20 Units Subcutaneous BID WC  . metFORMIN  500 mg Oral BID  WC  . sulfamethoxazole-trimethoprim  1 tablet Oral Q12H   Continuous Infusions: . sodium chloride 75 mL/hr at 10/02/15 1124    Principal Problem:   DKA (diabetic ketoacidoses) ([place]) Active Problems:   Obesity   [facility], [facility] Pager [phone]. If 7PM-7AM, please contact night-coverage at [email or web address], password [facility] 10/02/2015, 2:35 PM  LOS: 2 days

## 2015-10-03 LAB — BASIC METABOLIC PANEL
ANION GAP: 7 (ref 5–15)
BUN: 11 mg/dL (ref 6–20)
CALCIUM: 8.9 mg/dL (ref 8.9–10.3)
CO2: 27 mmol/L (ref 22–32)
Chloride: 103 mmol/L (ref 101–111)
Creatinine, Ser: 0.99 mg/dL (ref 0.61–1.24)
Glucose, Bld: 328 mg/dL — ABNORMAL HIGH (ref 65–99)
POTASSIUM: 4.2 mmol/L (ref 3.5–5.1)
Sodium: 137 mmol/L (ref 135–145)

## 2015-10-03 LAB — GLUCOSE, CAPILLARY
GLUCOSE-CAPILLARY: 343 mg/dL — AB (ref 65–99)
GLUCOSE-CAPILLARY: 377 mg/dL — AB (ref 65–99)
GLUCOSE-CAPILLARY: 380 mg/dL — AB (ref 65–99)
GLUCOSE-CAPILLARY: 298 mg/dL — AB (ref 65–99)

## 2015-10-03 MED ORDER — INSULIN ASPART PROT & ASPART (70-30 MIX) 100 UNIT/ML ~~LOC~~ SUSP: 35.0000 [IU] | Freq: Two times a day (BID) | SUBCUTANEOUS | Status: DC

## 2015-10-03 MED ORDER — "INSULIN SYRINGE 31G X 5/16"" 0.5 ML MISC": 1.0000 | Freq: Two times a day (BID) | Status: DC

## 2015-10-03 MED ORDER — FREESTYLE LANCETS MISC: Status: DC

## 2015-10-03 MED ORDER — INSULIN ASPART PROT & ASPART (70-30 MIX) 100 UNIT/ML ~~LOC~~ SUSP
28.0000 [IU] | Freq: Two times a day (BID) | SUBCUTANEOUS | Status: DC
  Administered 2015-10-03: 28 [IU] via SUBCUTANEOUS
  Filled 2015-10-03 (×2): qty 10

## 2015-10-03 MED ORDER — BACITRACIN-NEOMYCIN-POLYMYXIN OINTMENT TUBE
TOPICAL_OINTMENT | Freq: Every day | CUTANEOUS | Status: DC
  Administered 2015-10-03: 14:00:00 via TOPICAL
  Filled 2015-10-03: qty 15

## 2015-10-03 MED ORDER — SULFAMETHOXAZOLE-TRIMETHOPRIM 800-160 MG PO TABS: 1.0000 | ORAL_TABLET | Freq: Two times a day (BID) | ORAL | Status: DC

## 2015-10-03 MED ORDER — INSULIN ASPART PROT & ASPART (70-30 MIX) 100 UNIT/ML ~~LOC~~ SUSP: 25.0000 [IU] | Freq: Two times a day (BID) | SUBCUTANEOUS | Status: DC

## 2015-10-03 MED ORDER — DIPHENHYDRAMINE HCL 25 MG PO CAPS: 25.0000 mg | ORAL_CAPSULE | Freq: Once | ORAL | Status: DC

## 2015-10-03 MED ORDER — FREESTYLE SYSTEM KIT: 1.0000 | PACK | Status: DC | PRN

## 2015-10-03 MED ORDER — GLUCOSE BLOOD VI STRP: ORAL_STRIP | Status: DC

## 2015-10-03 MED ORDER — INSULIN STARTER KIT- SYRINGES (ENGLISH)
1.0000 | Freq: Once | Status: AC
  Administered 2015-10-03: 1
  Filled 2015-10-03: qty 1

## 2015-10-03 MED ORDER — METFORMIN HCL 500 MG PO TABS: 500.0000 mg | ORAL_TABLET | Freq: Two times a day (BID) | ORAL | Status: DC

## 2015-10-03 MED ORDER — INSULIN ASPART PROT & ASPART (70-30 MIX) 100 UNIT/ML ~~LOC~~ SUSP
35.0000 [IU] | Freq: Two times a day (BID) | SUBCUTANEOUS | Status: DC
  Administered 2015-10-03: 35 [IU] via SUBCUTANEOUS

## 2015-10-03 NOTE — Discharge Summary (Signed)
Physician Discharge Summary  Shawn Benson RWE:[id number] DOB: 11-16-1980 DOA: 09/30/2015  PCP: No primary care provider on file.  Admit date: 09/30/2015 Discharge date: 10/03/2015  Time spent: 20 minutes  Recommendations for Outpatient Follow-up:  1. Follow up with PCP as scheduled   Discharge Diagnoses:  Principal Problem:   DKA (diabetic ketoacidoses) ([place]) Active Problems:   Obesity   Discharge Condition: Improved  Diet recommendation: Diabetic  There were no vitals filed for this visit.  History of present illness:  Please review dictated H and P from 12/23 for details. Briefly, 34 y.o. male with a hx of obesity presents to the ed with polyuria x 1 week prior to admission associated with nausea/vomiting, fatigue. +family hx of DM. In the ED, pt was found to have blood glucose of 816 with anion gap of 15. Patient started on insulin gtt and hospitalist consulted for consideration for admission  Hospital Course:  1. Early DKA with newly diagnosed DM2 1. New onset in setting of obesity, family hx of DM 2. A1c of 10.5 3. Glucose improved and GAP closed with insulin gtt and IVF 4. Consulted diabetic coordinator and appreciate recs. Initially had started metformin with sulfonylurea per recs 5. Glucose remained just over 370. Discussed with Diabetic Coordinator. Recs to either continue metformin with amaryl with very close outpt follow up or begin 70/30 insulin starting at 20 units BID. 6. Met with patient and he chose to start 70/30 insulin. Insulin was titrated to 35units of 70/30 insulin BID 7. Patient is to follow up closely as outpatient for further insulin changes 2. Obesity 1. Stable 2. Wt loss recommended 3. DVT prophylaxis 1. Heparin subQ while admitted  Consultations:  Diabetic Coordinator  Discharge Exam: Filed Vitals:   10/02/15 1428 10/02/15 2107 10/03/15 0440 10/03/15 1316  BP: 140/90 125/81 105/50 142/97  Pulse: 82 98 78 93  Temp: 98.5 F (36.9 C)  99.6 F (37.6 C) 98.6 F (37 C) 99.4 F (37.4 C)  TempSrc: Oral Oral Oral Oral  Resp: _0 SpO2: 91% 98% 100% 99%    General: awake, in nad Cardiovascular: regular, s1, s23 Respiratory: normal resp effort, no wheezing  Discharge Instructions     Medication List    STOP taking these medications        DAY TIME COLD/FLU RELIEF 10-5-325 MG Caps  Generic drug:  DM-Phenylephrine-Acetaminophen     VICKS NYQUIL COLD & FLU 15-6.25-325 MG/15ML Liqd  Generic drug:  DM-Doxylamine-Acetaminophen      TAKE these medications        freestyle lancets  Use as instructed     glucose blood test strip  Use as instructed     glucose monitoring kit monitoring kit  1 each by Does not apply route as needed for other.     insulin aspart protamine- aspart (70-30) 100 UNIT/ML injection  Commonly known as:  NOVOLOG MIX 70/30  Inject 0.35 mLs (35 Units total) into the skin 2 (two) times daily with a meal.     INSULIN SYRINGE .5CC/31GX5/16" 31G X 5/16" 0.5 ML Misc  1 Device by Does not apply route 2 (two) times daily with a meal.     metFORMIN 500 MG tablet  Commonly known as:  GLUCOPHAGE  Take 1 tablet (500 mg total) by mouth 2 (two) times daily with a meal.     sulfamethoxazole-trimethoprim 800-160 MG tablet  Commonly known as:  BACTRIM DS,SEPTRA DS  Take 1 tablet by mouth every 12 (twelve)  hours.       No Known Allergies     Follow-up Information    Follow up with Please use the resources provided to you in emergency room by case manager to assist with doctor for follow up . Schedule an appointment as soon as possible for a visit on 10/03/2015.   Contact information:   These [place] uninsured resources provide possible primary care providers, resources for discounted medications, housing, dental resources, affordable care act information, plus other resources for [facility]         The results of significant diagnostics from this hospitalization (including  imaging, microbiology, ancillary and laboratory) are listed below for reference.    Significant Diagnostic Studies: No results found.  Microbiology: No results found for this or any previous visit (from the past 240 hour(s)).   Labs: Basic Metabolic Panel:  Recent Labs Lab 09/30/15 1258 09/30/15 1711 10/01/15 0424 10/02/15 0415 10/03/15 0420  NA 132* 139 139 138 137  K 4.3 4.5 3.3* 4.2 4.2  CL 94* 101 105 103 103  CO2 _0 GLUCOSE 816* 432* 205* 333* 328*  BUN _1 CREATININE 1.05 1.02 0.86 0.90 0.99  CALCIUM 8.9 8.8* 9.0 8.7* 8.9   Liver Function Tests:  Recent Labs Lab 09/30/15 1258 10/01/15 0424  AST 16 16  ALT 23 20  ALKPHOS 114 90  BILITOT 1.2 1.0  PROT 8.0 7.3  ALBUMIN 4.1 3.6   No results for input(s): LIPASE, AMYLASE in the last 168 hours. No results for input(s): AMMONIA in the last 168 hours. CBC:  Recent Labs Lab 09/30/15 1255 10/01/15 0424  WBC 12.8* 12.8*  HGB 13.3 12.8*  HCT 41.1 39.3  MCV 79.3 78.6  PLT 290 271   Cardiac Enzymes: No results for input(s): CKTOTAL, CKMB, CKMBINDEX, TROPONINI in the last 168 hours. BNP: BNP (last 3 results) No results for input(s): BNP in the last 8760 hours.  ProBNP (last 3 results) No results for input(s): PROBNP in the last 8760 hours.  CBG:  Recent Labs Lab 10/02/15 2142 10/03/15 0758 10/03/15 1148 10/03/15 1243 10/03/15 1711  GLUCAP 290* 343* 377* 380* 298*       Signed:  CHIU, STEPHEN K  [facility] 10/03/2015, 5:30 PM

## 2015-10-03 NOTE — Progress Notes (Addendum)
Inpatient Diabetes Program Recommendations  AACE/ADA: New Consensus Statement on Inpatient Glycemic Control (2015)  Target Ranges:  Prepandial:   less than 140 mg/dL      Peak postprandial:   less than 180 mg/dL (1-2 hours)      Critically ill patients:  140 - 180 mg/dL    Results for [name], Shawn Benson (MRN [id number]) as of 10/03/2015 09:21  Ref. Range 10/02/2015 07:36 10/02/2015 12:23 10/02/2015 17:44 10/02/2015 21:42  Glucose-Capillary Latest Ref Range: 65-99 mg/dL 213287 (H) 086371 (H) 578314 (H) 290 (H)    Results for Shawn Benson, Shawn Benson (MRN [id number]) as of 10/03/2015 09:21  Ref. Range 10/03/2015 07:58  Glucose-Capillary Latest Ref Range: 65-99 mg/dL 413343 (H)     Current Insulin Orders: 70/30 insulin- 28 units bidwc (increased this AM)      Novolog Moderate SSI (0-15 units) TID AC + HS      Metformin 500 mg bid      -Note 70/30 insulin increased to 28 units bidwc this AM.  This current dose is equivalent to approximately 0.2 units/kg dosing which is giving patient about 40 units of basal insulin per day in the 70/30 insulin and about 8 units Novolog with breakfast and supper.  -Based on patient's glucose trends and the amount of Novolog SSI that patient has been requiring, we may need to increase his 70/30 insulin dose further at time of discharge.     MD- When patient ready to d/c home, please consider increasing 70/30 insulin to 35 units bidwc.    This dose would be ~0.3 units/kg dosing which would give patient about 50 units total basal insulin throughout the day (25 units in the AM and 25 units in the PM) along with 10 units quick acting insulin with breakfast and supper.  Would also continue the Metformin 500 mg bid and have patient increase his dose to 1000 mg bid in 2 weeks if he can tolerate the possible side effects of GI upset.  Will make sure the RNs have been working with the patient on drawing up and injecting insulin.  MD- Please make sure to give patient Rxs for  the following at time of d/c [facility]- Insulin will be $25 per vial and 100 count box of syringes will be $12.  Will also tell patient about buying CBG meter at [facility] (meter is $9 and 50 strips are $9).  Use Orders #:  Reli-on 70/30 insulin- Order # [id number] Insulin Syringes 1/2 cc- Order # [id number] Metformin 500 mg- Order # [id number]   Addendum 10am: Spoke with patient by phone.  Reviewed 70/30 insulin with patient (what it is, how to take, when to take).  Per patient, he has been practicing giving insulin with the RNs already.  Discussed signs/symptoms of hypoglycemia with patient and how to treat Hypoglycemia.  Also instructed patient to purchase a CBG meter at [facility] (Reli-on meter is $9 and 50 strips are $9).   --Will follow patient during hospitalization--  Ambrose FinlandJeannine Johnston Brodyn Depuy RN, MSN, CDE Diabetes Coordinator Inpatient Glycemic Control Team Team Pager: [phone] (8a-5p)

## 2015-10-03 NOTE — Progress Notes (Signed)
Patient is alert and oriented. VS stable. Dinner blood sugar down to 298, patient education on s/s of hypoglycemia and s/s of hyperglycemia. Patient being discharged to home. Reviewed discharge education, instructions and medications. Diabetes education provided and reviewed. MD aware patient has a few questions. MD answered all questions. Patient resting at this time, waiting on wife to arrive.

## 2015-10-03 NOTE — Discharge Instructions (Signed)
Please go to [facility] to get your Prescriptions filled.  70/30 insulin will be $24.88 per vial and Insulin syringes should be $12.    Please also buy blood sugar meter, strips, and [facility]    ReliOn Prime blood sugar meter will be $9 and box of 50 test strips will be $9.  Please also buy extra box of ReliOn lancets for $4.

## 2015-10-04 LAB — GLUTAMIC ACID DECARBOXYLASE AUTO ABS: Glutamic Acid Decarb Ab: <5 U/mL (ref 0.0–5.0)

## 2015-10-05 ENCOUNTER — Encounter (HOSPITAL_COMMUNITY): Payer: Self-pay

## 2015-10-05 ENCOUNTER — Inpatient Hospital Stay (HOSPITAL_COMMUNITY)
Admission: EM | Admit: 2015-10-05 | Discharge: 2015-10-07 | DRG: 603 | Disposition: A | Discharge status: Home / Self Care | Payer: Self-pay | Attending: Internal Medicine | Admitting: Internal Medicine

## 2015-10-05 ENCOUNTER — Emergency Department (HOSPITAL_COMMUNITY): Payer: Self-pay

## 2015-10-05 DIAGNOSIS — Z7984 Long term (current) use of oral hypoglycemic drugs: Secondary | ICD-10-CM

## 2015-10-05 DIAGNOSIS — L0291 Cutaneous abscess, unspecified: Secondary | ICD-10-CM

## 2015-10-05 DIAGNOSIS — Z794 Long term (current) use of insulin: Secondary | ICD-10-CM

## 2015-10-05 DIAGNOSIS — Z6841 Body Mass Index (BMI) 40.0 and over, adult: Secondary | ICD-10-CM

## 2015-10-05 DIAGNOSIS — L0231 Cutaneous abscess of buttock: Principal | ICD-10-CM | POA: Diagnosis present

## 2015-10-05 DIAGNOSIS — J45909 Unspecified asthma, uncomplicated: Secondary | ICD-10-CM | POA: Diagnosis present

## 2015-10-05 DIAGNOSIS — L0390 Cellulitis, unspecified: Secondary | ICD-10-CM

## 2015-10-05 DIAGNOSIS — D72829 Elevated white blood cell count, unspecified: Secondary | ICD-10-CM | POA: Diagnosis present

## 2015-10-05 DIAGNOSIS — R739 Hyperglycemia, unspecified: Secondary | ICD-10-CM

## 2015-10-05 DIAGNOSIS — E119 Type 2 diabetes mellitus without complications: Secondary | ICD-10-CM

## 2015-10-05 DIAGNOSIS — Z87891 Personal history of nicotine dependence: Secondary | ICD-10-CM

## 2015-10-05 DIAGNOSIS — E1165 Type 2 diabetes mellitus with hyperglycemia: Secondary | ICD-10-CM | POA: Diagnosis present

## 2015-10-05 HISTORY — DX: Type 2 diabetes mellitus without complications: E11.9

## 2015-10-05 LAB — BASIC METABOLIC PANEL
ANION GAP: 9 (ref 5–15)
BUN: 8 mg/dL (ref 6–20)
CALCIUM: 8.7 mg/dL — AB (ref 8.9–10.3)
CO2: 24 mmol/L (ref 22–32)
Chloride: 101 mmol/L (ref 101–111)
Creatinine, Ser: 0.8 mg/dL (ref 0.61–1.24)
Glucose, Bld: 328 mg/dL — ABNORMAL HIGH (ref 65–99)
POTASSIUM: 3.7 mmol/L (ref 3.5–5.1)
Sodium: 134 mmol/L — ABNORMAL LOW (ref 135–145)

## 2015-10-05 LAB — CBC WITH DIFFERENTIAL/PLATELET
BASOS ABS: 0 10*3/uL (ref 0.0–0.1)
BASOS PCT: 0 %
Eosinophils Absolute: 0.3 10*3/uL (ref 0.0–0.7)
Eosinophils Relative: 2 %
HEMATOCRIT: 38.3 % — AB (ref 39.0–52.0)
Hemoglobin: 12.7 g/dL — ABNORMAL LOW (ref 13.0–17.0)
Lymphocytes Relative: 15 %
Lymphs Abs: 2.1 10*3/uL (ref 0.7–4.0)
MCH: 26.5 pg (ref 26.0–34.0)
MCHC: 33.2 g/dL (ref 30.0–36.0)
MCV: 79.8 fL (ref 78.0–100.0)
MONO ABS: 0.8 10*3/uL (ref 0.1–1.0)
Monocytes Relative: 6 %
NEUTROS PCT: 77 %
Neutro Abs: 10.3 10*3/uL — ABNORMAL HIGH (ref 1.7–7.7)
PLATELETS: 282 10*3/uL (ref 150–400)
RBC: 4.8 MIL/uL (ref 4.22–5.81)
RDW: 14.4 % (ref 11.5–15.5)
WBC: 13.5 10*3/uL — AB (ref 4.0–10.5)

## 2015-10-05 LAB — MAGNESIUM: MAGNESIUM: 1.6 mg/dL — AB (ref 1.7–2.4)

## 2015-10-05 LAB — GLUCOSE, CAPILLARY: GLUCOSE-CAPILLARY: 295 mg/dL — AB (ref 65–99)

## 2015-10-05 LAB — CBG MONITORING, ED
GLUCOSE-CAPILLARY: 258 mg/dL — AB (ref 65–99)
Glucose-Capillary: 295 mg/dL — ABNORMAL HIGH (ref 65–99)

## 2015-10-05 MED ORDER — ONDANSETRON HCL 4 MG/2ML IJ SOLN
4.0000 mg | Freq: Once | INTRAMUSCULAR | Status: AC
  Administered 2015-10-05: 4 mg via INTRAVENOUS
  Filled 2015-10-05: qty 2

## 2015-10-05 MED ORDER — INFLUENZA VAC SPLIT QUAD 0.5 ML IM SUSY
0.5000 mL | PREFILLED_SYRINGE | INTRAMUSCULAR | Status: DC
  Filled 2015-10-05 (×2): qty 0.5

## 2015-10-05 MED ORDER — PIPERACILLIN-TAZOBACTAM 3.375 G IVPB
3.3750 g | Freq: Once | INTRAVENOUS | Status: AC
  Administered 2015-10-05: 3.375 g via INTRAVENOUS
  Filled 2015-10-05: qty 50

## 2015-10-05 MED ORDER — LIDOCAINE-PRILOCAINE 2.5-2.5 % EX CREA
TOPICAL_CREAM | Freq: Once | CUTANEOUS | Status: AC
  Administered 2015-10-05: 22:00:00 via TOPICAL
  Filled 2015-10-05: qty 5

## 2015-10-05 MED ORDER — IOHEXOL 300 MG/ML  SOLN
100.0000 mL | Freq: Once | INTRAMUSCULAR | Status: AC | PRN
  Administered 2015-10-05: 100 mL via INTRAVENOUS

## 2015-10-05 MED ORDER — PNEUMOCOCCAL VAC POLYVALENT 25 MCG/0.5ML IJ INJ
0.5000 mL | INJECTION | INTRAMUSCULAR | Status: DC
  Filled 2015-10-05 (×2): qty 0.5

## 2015-10-05 MED ORDER — SODIUM CHLORIDE 0.9 % IV BOLUS (SEPSIS)
1000.0000 mL | Freq: Once | INTRAVENOUS | Status: AC
  Administered 2015-10-05: 1000 mL via INTRAVENOUS

## 2015-10-05 MED ORDER — HYDROMORPHONE HCL 1 MG/ML IJ SOLN: 1.0000 mg | INTRAMUSCULAR | Status: DC | PRN

## 2015-10-05 MED ORDER — HYDROMORPHONE HCL 1 MG/ML IJ SOLN
1.0000 mg | Freq: Once | INTRAMUSCULAR | Status: AC
Start: 2015-10-05 — End: 2015-10-05
  Administered 2015-10-05: 1 mg via INTRAVENOUS
  Filled 2015-10-05: qty 1

## 2015-10-05 MED ORDER — ONDANSETRON HCL 4 MG/2ML IJ SOLN: 4.0000 mg | Freq: Three times a day (TID) | INTRAMUSCULAR | Status: DC | PRN

## 2015-10-05 NOTE — ED Provider Notes (Signed)
History  By signing my name below, I, Marlowe Kays, attest that this documentation has been prepared under the direction and in the presence of Josh Shamell Hittle, PA-C. Electronically Signed: Marlowe Kays, ED Scribe. 10/05/2015. 3:51 PM.  Chief Complaint  Patient presents with  . Abscess   The history is provided by medical records. No language interpreter was used.    HPI Comments:  Shawn Benson is a 34 y.o. male, with PMHx of DM, who presents to the Emergency Department complaining of an abscess to the medial right buttock that appeared approximately 5 days ago while he was in the hospital. The area was initially very small. Pt reports minimal drainage from the abscess. He states he has been on Bactrim since yesterday and has taken a total of 2 doses, but it has not provided any relief as of yet. He has not taken anything for pain. Sitting increases the pain. He is able to have a bowel movement but passing stool is painful. He denies alleviating factors. He denies fever, chills, nausea, or vomiting. Pt states he is trying to establish care at [facility].   Past Medical History  Diagnosis Date  . Asthma   . Diabetes mellitus without complication [facility])    Past Surgical History  Procedure Laterality Date  . Knee surgery      Right and Left Knee   No family history on file. Social History  Substance Use Topics  . Smoking status: Current Every Day Smoker -- 3.00 packs/day    Types: Cigarettes  . Smokeless tobacco: None  . Alcohol Use: No    Review of Systems  Constitutional: Negative for fever and chills.  HENT: Negative for rhinorrhea and sore throat.   Eyes: Negative for redness.  Respiratory: Negative for cough.   Cardiovascular: Negative for chest pain.  Gastrointestinal: Positive for rectal pain. Negative for nausea, vomiting, abdominal pain and diarrhea.  Genitourinary: Negative for dysuria, decreased urine volume, scrotal swelling and  testicular pain.  Musculoskeletal: Negative for myalgias.  Skin: Positive for color change (abscess to right buttock). Negative for rash.       Positive for abscess  Neurological: Negative for headaches.  Hematological: Negative for adenopathy.    Allergies  Review of patient's allergies indicates no known allergies.  Home Medications   Prior to Admission medications   Medication Sig Start Date End Date Taking? Authorizing Provider  glucose blood test strip Use as instructed 10/03/15   Donne Hazel, MD  glucose monitoring kit (FREESTYLE) monitoring kit 1 each by Does not apply route as needed for other. 10/03/15   Donne Hazel, MD  insulin aspart protamine- aspart (NOVOLOG MIX 70/30) (70-30) 100 UNIT/ML injection Inject 0.35 mLs (35 Units total) into the skin 2 (two) times daily with a meal. 10/03/15   Donne Hazel, MD  Insulin Syringe-Needle U-100 (INSULIN SYRINGE .5CC/31GX5/16") 31G X 5/16" 0.5 ML MISC 1 Device by Does not apply route 2 (two) times daily with a meal. 10/03/15   Donne Hazel, MD  Lancets (FREESTYLE) lancets Use as instructed 10/03/15   Donne Hazel, MD  metFORMIN (GLUCOPHAGE) 500 MG tablet Take 1 tablet (500 mg total) by mouth 2 (two) times daily with a meal. 10/03/15   Donne Hazel, MD  sulfamethoxazole-trimethoprim (BACTRIM DS,SEPTRA DS) 800-160 MG tablet Take 1 tablet by mouth every 12 (twelve) hours. 10/03/15   Donne Hazel, MD   Triage Vitals: BP 148/104 mmHg  Pulse 111  Temp(Src) 98.3 F (36.8 C) (Oral)  Resp 22  SpO2 98% Physical Exam  Constitutional: He appears well-developed and well-nourished.  HENT:  Head: Normocephalic and atraumatic.  Eyes: Conjunctivae and EOM are normal. Right eye exhibits no discharge. Left eye exhibits no discharge.  Neck: Normal range of motion. Neck supple.  Cardiovascular: Normal rate, regular rhythm and normal heart sounds.   Pulmonary/Chest: Effort normal and breath sounds normal.  Abdominal: Soft. There is no  tenderness.  Genitourinary:  Large 8cm area of induration R gluteal cleft with tenderness tracking towards the perineum. No active drainage. There is fluctuance.   Musculoskeletal: Normal range of motion.  Neurological: He is alert.  Skin: Skin is warm and dry.  Psychiatric: He has a normal mood and affect. His behavior is normal.  Nursing note and vitals reviewed.   ED Course  Procedures (including critical care time) DIAGNOSTIC STUDIES: Oxygen Saturation is 98% on RA, normal by my interpretation.   COORDINATION OF CARE: 3:50 PM- Will ultrasound area. Pt verbalizes understanding and agrees to plan.  4:01 PM- Will order CT scan.   Medications  Influenza vac split quadrivalent PF (FLUARIX) injection 0.5 mL (not administered)  pneumococcal 23 valent vaccine (PNU-IMMUNE) injection 0.5 mL (not administered)  insulin aspart protamine- aspart (NOVOLOG MIX 70/30) injection 35 Units (35 Units Subcutaneous Not Given 10/06/15 0853)  sodium chloride 0.9 % injection 3 mL (3 mLs Intravenous Not Given 10/06/15 0958)  insulin aspart (novoLOG) injection 0-20 Units (0 Units Subcutaneous Not Given 10/06/15 0852)  pantoprazole (PROTONIX) EC tablet 40 mg (40 mg Oral Not Given 10/06/15 0130)  0.9 % NaCl with KCl 20 mEq/ L  infusion ( Intravenous New Bag/Given 10/06/15 0205)  HYDROmorphone (DILAUDID) injection 1 mg (not administered)  ondansetron (ZOFRAN) injection 4 mg (not administered)  piperacillin-tazobactam (ZOSYN) IVPB 3.375 g (3.375 g Intravenous Given 10/06/15 0611)  vancomycin (VANCOCIN) 1,500 mg in sodium chloride 0.9 % 500 mL IVPB (not administered)  enoxaparin (LOVENOX) injection 90 mg (not administered)  metFORMIN (GLUCOPHAGE) tablet 500 mg (not administered)  iohexol (OMNIPAQUE) 300 MG/ML solution 100 mL (100 mLs Intravenous Contrast Given 10/05/15 1800)  HYDROmorphone (DILAUDID) injection 1 mg (1 mg Intravenous Given 10/05/15 1856)  ondansetron (ZOFRAN) injection 4 mg (4 mg Intravenous  Given 10/05/15 1856)  lidocaine-prilocaine (EMLA) cream ( Topical Given 10/05/15 2211)  piperacillin-tazobactam (ZOSYN) IVPB 3.375 g (3.375 g Intravenous New Bag/Given 10/05/15 2210)  sodium chloride 0.9 % bolus 1,000 mL (1,000 mLs Intravenous New Bag/Given 10/05/15 2210)  ketorolac (TORADOL) 30 MG/ML injection 30 mg (30 mg Intravenous Given 10/06/15 0204)  vancomycin (VANCOCIN) 2,500 mg in sodium chloride 0.9 % 500 mL IVPB (2,500 mg Intravenous Given 10/06/15 0204)  insulin aspart (novoLOG) injection 5 Units (5 Units Subcutaneous Given 10/06/15 0851)    Labs Review Labs Reviewed  CBC WITH DIFFERENTIAL/PLATELET - Abnormal; Notable for the following:    WBC 13.5 (*)    Hemoglobin 12.7 (*)    HCT 38.3 (*)    Neutro Abs 10.3 (*)    All other components within normal limits  BASIC METABOLIC PANEL - Abnormal; Notable for the following:    Sodium 134 (*)    Glucose, Bld 328 (*)    Calcium 8.7 (*)    All other components within normal limits  MAGNESIUM - Abnormal; Notable for the following:    Magnesium 1.6 (*)    All other components within normal limits  GLUCOSE, CAPILLARY - Abnormal; Notable for the following:    Glucose-Capillary 295 (*)  All other components within normal limits  COMPREHENSIVE METABOLIC PANEL - Abnormal; Notable for the following:    Glucose, Bld 300 (*)    Calcium 8.4 (*)    Total Protein 6.3 (*)    Albumin 2.9 (*)    All other components within normal limits  CBC WITH DIFFERENTIAL/PLATELET - Abnormal; Notable for the following:    WBC 13.6 (*)    Hemoglobin 10.8 (*)    HCT 33.7 (*)    MCH 25.5 (*)    Neutro Abs 9.7 (*)    All other components within normal limits  PROTIME-INR - Abnormal; Notable for the following:    Prothrombin Time 15.4 (*)    All other components within normal limits  GLUCOSE, CAPILLARY - Abnormal; Notable for the following:    Glucose-Capillary 266 (*)    All other components within normal limits  GLUCOSE, CAPILLARY - Abnormal;  Notable for the following:    Glucose-Capillary 273 (*)    All other components within normal limits  CBG MONITORING, ED - Abnormal; Notable for the following:    Glucose-Capillary 258 (*)    All other components within normal limits  CBG MONITORING, ED - Abnormal; Notable for the following:    Glucose-Capillary 295 (*)    All other components within normal limits  SURGICAL PCR SCREEN  MAGNESIUM  PHOSPHORUS  APTT    Imaging Review Ct Abdomen Pelvis W Contrast  10/05/2015  CLINICAL DATA:  34 year old male with right gluteal abscess. EXAM: CT ABDOMEN AND PELVIS WITH CONTRAST TECHNIQUE: Multidetector CT imaging of the abdomen and pelvis was performed using the standard protocol following bolus administration of intravenous contrast. CONTRAST:  134m OMNIPAQUE IOHEXOL 300 MG/ML  SOLN COMPARISON:  None. FINDINGS: Visualized lung bases are clear. No intra-abdominal free air or free fluid. Diffuse fatty infiltration of the liver. The gallbladder, pancreas, spleen, adrenal glands, kidneys, visualized ureters, and urinary bladder appear unremarkable. The prostate and seminal vesicles are grossly unremarkable. There is moderate stool throughout the colon. No evidence of bowel obstruction or inflammation. Normal appendix. The abdominal aorta and IVC appear unremarkable on this noncontrast study. No portal venous gas identified. There is no adenopathy. There is diffuse inflammatory changes and stranding of the subcutaneous soft tissues of the right gluteal region. There are two more localized fluid attenuating foci measuring 2.3 x 2.7 cm and 3.0 x 2.3 cm approximately 1.5 cm deep into the skin of the fissure of the buttocks concerning for phlegmon or developing abscess. The more superficial collection appears to extend to the posterior skin surface of the right buttock. Ultrasound may provide better evaluation. The osseous structures are unremarkable. IMPRESSION: Inflammatory changes of the subcutaneous soft  tissues of the right buttock with multiple small fluid collections in the superficial subcutaneous soft tissues approximately 1.0-1.5 cm deep to the skin. There is apparent extension of the superficial collection to the skin surface. Correlation with clinical exam is recommended. Ultrasound may provide additional evaluation. No acute intra-abdominal pelvic pathology. Electronically Signed   By: AAnner CreteM.D.   On: 10/05/2015 18:37   I have personally reviewed and evaluated these images and lab results as part of my medical decision-making.   EKG Interpretation None       Patient seen and examined. Ultrasound shows irregular abscess. No definite perineal involvement. Discussed with Dr. MThomasene Lot CT/labs ordered.  CT findings as above. Will ask surgery for recommendations.  Handoff to PDupontPA-C who will discuss findings to this point with surgery.  MDM  Final diagnoses:  Abscess and cellulitis  Hyperglycemia without ketosis   Pending surgery recommendations for large, irregular abscess in a patient with uncontrolled diabetes.  I personally performed the services described in this documentation, which was scribed in my presence. The recorded information has been reviewed and is accurate.     Carlisle Cater, PA-C 10/06/15 [place], MD 10/06/15 1623

## 2015-10-05 NOTE — ED Notes (Signed)
Patient transported to CT 

## 2015-10-05 NOTE — Progress Notes (Addendum)
Patient recently admitted and discharged 12/23 to 12/26 for DKA.  Patient is newly diagnosed diabetic.  EDCM spoke to patient and his wife at bedside.  Patient confirms he was able to purchase his insulin and syringes without difficulty.  Patient also reports he has purchased a glucometer.  Patient is aware he has a pcp follow up visit at the [facility] on 01/11/21017 at 10am, placed on AVS. Patient reports he will inquire about the orange card while at [facility]. Patient without further [facility] PEDCM needs.  Patient appears drowsy and diaphoretic.  [facility] to check patient's blood sugar.  No further EDCM needs at this time.

## 2015-10-05 NOTE — ED Notes (Signed)
Pt in restroom, will come back to draw labs

## 2015-10-05 NOTE — ED Notes (Signed)
INITIAL ASSESSMENT COMPLETED. PT C/O AN ABSCESS TO THE INNER RIGHT BUTTOCK X5 DAYS. ABSCESS DID DRAIN ON IT'S ON. AWAITING FURTHER ORDERS.

## 2015-10-05 NOTE — ED Notes (Signed)
He c/o abscess at inner right buttock.  He states he was recently released from hospital with dx of new-onset diabetes.  He is in no distress.

## 2015-10-05 NOTE — ED Provider Notes (Signed)
PROGRESS NOTE                                                                                                                 This is a sign-out from PA geiple at shift change: Shawn Benson is a 34 y.o. male presenting with worsening gluteal abscess, patient is newly diagnosed insulin-dependent diabetic, has been on 2 days of Bactrim he is afebrile with a white count of 13, CAT scan shows a multiple fluid collections in the buttock with developing phlegmon extending into the deeper tissues. Plan is to discuss with surgery.  Please refer to previous note for full HPI, ROS, PMH and PE.    Surgery consult from Dr. Daphine DeutscherMartin appreciated: We have discussed the results of the CAT scan and he recommends admission to hospitalist for IV antibiotics,  insulin drip, commence warm soaks, nothing by mouth after midnight and will evaluate in the a.m. Admission accepted by Dr. Solon Palmrtiz  Filed Vitals:   10/05/15 1333 10/05/15 1625 10/05/15 1850  BP: 148/104 100/57 112/67  Pulse: 111 92 100  Temp: 98.3 F (36.8 C)    TempSrc: Oral    Resp: 22 20 17   SpO2: 98% 100% 100%    Medications  lidocaine-prilocaine (EMLA) cream (not administered)  piperacillin-tazobactam (ZOSYN) IVPB 3.375 g (not administered)  iohexol (OMNIPAQUE) 300 MG/ML solution 100 mL (100 mLs Intravenous Contrast Given 10/05/15 1800)  HYDROmorphone (DILAUDID) injection 1 mg (1 mg Intravenous Given 10/05/15 1856)  ondansetron (ZOFRAN) injection 4 mg (4 mg Intravenous Given 10/05/15 1856)   Ct Abdomen Pelvis W Contrast  10/05/2015  CLINICAL DATA:  34 year old male with right gluteal abscess. EXAM: CT ABDOMEN AND PELVIS WITH CONTRAST TECHNIQUE: Multidetector CT imaging of the abdomen and pelvis was performed using the standard protocol following bolus administration of intravenous contrast. CONTRAST:  100mL OMNIPAQUE IOHEXOL 300 MG/ML  SOLN COMPARISON:  None. FINDINGS: Visualized lung bases are clear. No intra-abdominal free air or free fluid.  Diffuse fatty infiltration of the liver. The gallbladder, pancreas, spleen, adrenal glands, kidneys, visualized ureters, and urinary bladder appear unremarkable. The prostate and seminal vesicles are grossly unremarkable. There is moderate stool throughout the colon. No evidence of bowel obstruction or inflammation. Normal appendix. The abdominal aorta and IVC appear unremarkable on this noncontrast study. No portal venous gas identified. There is no adenopathy. There is diffuse inflammatory changes and stranding of the subcutaneous soft tissues of the right gluteal region. There are two more localized fluid attenuating foci measuring 2.3 x 2.7 cm and 3.0 x 2.3 cm approximately 1.5 cm deep into the skin of the fissure of the buttocks concerning for phlegmon or developing abscess. The more superficial collection appears to extend to the posterior skin surface of the right buttock. Ultrasound may provide better evaluation. The osseous structures are unremarkable. IMPRESSION: Inflammatory changes of the subcutaneous soft tissues of the right buttock with multiple small fluid collections in the superficial subcutaneous soft tissues approximately 1.0-1.5 cm deep to the skin. There is apparent extension of the superficial  collection to the skin surface. Correlation with clinical exam is recommended. Ultrasound may provide additional evaluation. No acute intra-abdominal pelvic pathology. Electronically Signed   By: Elgie Collard M.D.   On: 10/05/2015 18:37      Wynetta Emery, PA-C 10/05/15 2139  Courteney Randall An, MD 10/05/15 2332

## 2015-10-05 NOTE — ED Notes (Signed)
REPORT GIVEN TO MONICA, RN [id number]. AAOX3. PT IN NO APPARENT DISTRESS. IV ZOSYN AND NS INFUSING W/O PAIN OR SWELLING. WILL TRANSPORT PT TO THE FLOOR.

## 2015-10-05 NOTE — ED Notes (Signed)
PT RETURNED FROM CT

## 2015-10-06 ENCOUNTER — Inpatient Hospital Stay (HOSPITAL_COMMUNITY): Payer: Self-pay | Admitting: Registered Nurse

## 2015-10-06 ENCOUNTER — Encounter (HOSPITAL_COMMUNITY)
Admission: EM | Disposition: A | Discharge status: Home / Self Care | Payer: Self-pay | Source: Home / Self Care | Attending: Internal Medicine

## 2015-10-06 ENCOUNTER — Encounter (HOSPITAL_COMMUNITY): Payer: Self-pay | Admitting: Internal Medicine

## 2015-10-06 DIAGNOSIS — E669 Obesity, unspecified: Secondary | ICD-10-CM

## 2015-10-06 DIAGNOSIS — J45909 Unspecified asthma, uncomplicated: Secondary | ICD-10-CM | POA: Diagnosis present

## 2015-10-06 DIAGNOSIS — L0231 Cutaneous abscess of buttock: Principal | ICD-10-CM | POA: Diagnosis present

## 2015-10-06 DIAGNOSIS — E119 Type 2 diabetes mellitus without complications: Secondary | ICD-10-CM

## 2015-10-06 HISTORY — PX: INCISION AND DRAINAGE PERIRECTAL ABSCESS: SHX1804

## 2015-10-06 LAB — GLUCOSE, CAPILLARY
GLUCOSE-CAPILLARY: 266 mg/dL — AB (ref 65–99)
GLUCOSE-CAPILLARY: 277 mg/dL — AB (ref 65–99)
GLUCOSE-CAPILLARY: 263 mg/dL — AB (ref 65–99)
GLUCOSE-CAPILLARY: 212 mg/dL — AB (ref 65–99)
GLUCOSE-CAPILLARY: 191 mg/dL — AB (ref 65–99)
GLUCOSE-CAPILLARY: 193 mg/dL — AB (ref 65–99)
Glucose-Capillary: 273 mg/dL — ABNORMAL HIGH (ref 65–99)
Glucose-Capillary: 269 mg/dL — ABNORMAL HIGH (ref 65–99)
Glucose-Capillary: 184 mg/dL — ABNORMAL HIGH (ref 65–99)
Glucose-Capillary: 192 mg/dL — ABNORMAL HIGH (ref 65–99)
Glucose-Capillary: 183 mg/dL — ABNORMAL HIGH (ref 65–99)
Glucose-Capillary: 170 mg/dL — ABNORMAL HIGH (ref 65–99)
Glucose-Capillary: 169 mg/dL — ABNORMAL HIGH (ref 65–99)

## 2015-10-06 LAB — COMPREHENSIVE METABOLIC PANEL
ALBUMIN: 2.9 g/dL — AB (ref 3.5–5.0)
ALT: 35 U/L (ref 17–63)
AST: 16 U/L (ref 15–41)
Alkaline Phosphatase: 67 U/L (ref 38–126)
Anion gap: 9 (ref 5–15)
BUN: 9 mg/dL (ref 6–20)
CHLORIDE: 102 mmol/L (ref 101–111)
CO2: 27 mmol/L (ref 22–32)
CREATININE: 0.92 mg/dL (ref 0.61–1.24)
Calcium: 8.4 mg/dL — ABNORMAL LOW (ref 8.9–10.3)
GFR calc Af Amer: >60 mL/min (ref >60)
GFR calc non Af Amer: >60 mL/min (ref >60)
GLUCOSE: 300 mg/dL — AB (ref 65–99)
POTASSIUM: 3.7 mmol/L (ref 3.5–5.1)
SODIUM: 138 mmol/L (ref 135–145)
Total Bilirubin: 0.9 mg/dL (ref 0.3–1.2)
Total Protein: 6.3 g/dL — ABNORMAL LOW (ref 6.5–8.1)

## 2015-10-06 LAB — CBC WITH DIFFERENTIAL/PLATELET
Basophils Absolute: 0 10*3/uL (ref 0.0–0.1)
Basophils Relative: 0 %
EOS ABS: 0.4 10*3/uL (ref 0.0–0.7)
EOS PCT: 3 %
HCT: 33.7 % — ABNORMAL LOW (ref 39.0–52.0)
Hemoglobin: 10.8 g/dL — ABNORMAL LOW (ref 13.0–17.0)
LYMPHS ABS: 2.4 10*3/uL (ref 0.7–4.0)
LYMPHS PCT: 17 %
MCH: 25.5 pg — AB (ref 26.0–34.0)
MCHC: 32 g/dL (ref 30.0–36.0)
MCV: 79.7 fL (ref 78.0–100.0)
MONOS PCT: 8 %
Monocytes Absolute: 1 10*3/uL (ref 0.1–1.0)
Neutro Abs: 9.7 10*3/uL — ABNORMAL HIGH (ref 1.7–7.7)
Neutrophils Relative %: 72 %
PLATELETS: 261 10*3/uL (ref 150–400)
RBC: 4.23 MIL/uL (ref 4.22–5.81)
RDW: 14.6 % (ref 11.5–15.5)
WBC: 13.6 10*3/uL — ABNORMAL HIGH (ref 4.0–10.5)

## 2015-10-06 LAB — PROTIME-INR
INR: 1.2 (ref 0.00–1.49)
Prothrombin Time: 15.4 seconds — ABNORMAL HIGH (ref 11.6–15.2)

## 2015-10-06 LAB — SURGICAL PCR SCREEN
MRSA, PCR: NEGATIVE
Staphylococcus aureus: POSITIVE — AB

## 2015-10-06 LAB — MAGNESIUM: MAGNESIUM: 1.7 mg/dL (ref 1.7–2.4)

## 2015-10-06 LAB — APTT: APTT: 28 s (ref 24–37)

## 2015-10-06 LAB — PHOSPHORUS: Phosphorus: 3.8 mg/dL (ref 2.5–4.6)

## 2015-10-06 SURGERY — INCISION AND DRAINAGE, ABSCESS, PERIRECTAL
Anesthesia: General | Site: Buttocks

## 2015-10-06 MED ORDER — SUCCINYLCHOLINE CHLORIDE 20 MG/ML IJ SOLN
INTRAMUSCULAR | Status: DC | PRN
  Administered 2015-10-06: 130 mg via INTRAVENOUS

## 2015-10-06 MED ORDER — INSULIN REGULAR HUMAN 100 UNIT/ML IJ SOLN
INTRAMUSCULAR | Status: DC
  Administered 2015-10-06: 2.2 [IU]/h via INTRAVENOUS
  Administered 2015-10-07: 8.44 [IU]/h via INTRAVENOUS
  Filled 2015-10-06: qty 2.5

## 2015-10-06 MED ORDER — POTASSIUM CHLORIDE IN NACL 20-0.9 MEQ/L-% IV SOLN
INTRAVENOUS | Status: DC
  Administered 2015-10-06: 02:00:00 via INTRAVENOUS
  Administered 2015-10-06: 100 mL/h via INTRAVENOUS
  Administered 2015-10-06: 14:00:00 via INTRAVENOUS
  Filled 2015-10-06 (×5): qty 1000

## 2015-10-06 MED ORDER — HYDROMORPHONE HCL 1 MG/ML IJ SOLN
INTRAMUSCULAR | Status: AC
  Filled 2015-10-06: qty 1

## 2015-10-06 MED ORDER — LIDOCAINE HCL (CARDIAC) 20 MG/ML IV SOLN
INTRAVENOUS | Status: DC | PRN
  Administered 2015-10-06: 100 mg via INTRAVENOUS

## 2015-10-06 MED ORDER — INSULIN ASPART PROT & ASPART (70-30 MIX) 100 UNIT/ML ~~LOC~~ SUSP
35.0000 [IU] | Freq: Two times a day (BID) | SUBCUTANEOUS | Status: DC
  Filled 2015-10-06: qty 10

## 2015-10-06 MED ORDER — LACTATED RINGERS IV SOLN: INTRAVENOUS | Status: DC

## 2015-10-06 MED ORDER — ALBUTEROL SULFATE HFA 108 (90 BASE) MCG/ACT IN AERS
INHALATION_SPRAY | RESPIRATORY_TRACT | Status: AC
  Filled 2015-10-06: qty 6.7

## 2015-10-06 MED ORDER — MIDAZOLAM HCL 5 MG/5ML IJ SOLN
INTRAMUSCULAR | Status: DC | PRN
  Administered 2015-10-06 (×2): 1 mg via INTRAVENOUS

## 2015-10-06 MED ORDER — ONDANSETRON HCL 4 MG/2ML IJ SOLN
4.0000 mg | Freq: Four times a day (QID) | INTRAMUSCULAR | Status: DC | PRN
  Administered 2015-10-06: 4 mg via INTRAVENOUS

## 2015-10-06 MED ORDER — PIPERACILLIN-TAZOBACTAM 3.375 G IVPB
3.3750 g | Freq: Three times a day (TID) | INTRAVENOUS | Status: DC
  Administered 2015-10-06: 3.375 g via INTRAVENOUS
  Filled 2015-10-06 (×4): qty 50

## 2015-10-06 MED ORDER — SODIUM CHLORIDE 0.9 % IV SOLN
INTRAVENOUS | Status: DC | PRN
  Administered 2015-10-06: 12:00:00 via INTRAVENOUS

## 2015-10-06 MED ORDER — PROPOFOL 10 MG/ML IV BOLUS
INTRAVENOUS | Status: AC
  Filled 2015-10-06: qty 40

## 2015-10-06 MED ORDER — HYDROMORPHONE HCL 1 MG/ML IJ SOLN: 0.5000 mg | INTRAMUSCULAR | Status: DC | PRN

## 2015-10-06 MED ORDER — PROMETHAZINE HCL 25 MG/ML IJ SOLN: 6.2500 mg | INTRAMUSCULAR | Status: DC | PRN

## 2015-10-06 MED ORDER — ONDANSETRON HCL 4 MG/2ML IJ SOLN
INTRAMUSCULAR | Status: AC
  Filled 2015-10-06: qty 2

## 2015-10-06 MED ORDER — SUGAMMADEX SODIUM 500 MG/5ML IV SOLN
INTRAVENOUS | Status: DC | PRN
  Administered 2015-10-06: 500 mg via INTRAVENOUS

## 2015-10-06 MED ORDER — VANCOMYCIN HCL 10 G IV SOLR
1500.0000 mg | Freq: Three times a day (TID) | INTRAVENOUS | Status: DC
  Administered 2015-10-06 – 2015-10-07 (×3): 1500 mg via INTRAVENOUS
  Filled 2015-10-06 (×5): qty 1500

## 2015-10-06 MED ORDER — ROCURONIUM BROMIDE 100 MG/10ML IV SOLN
INTRAVENOUS | Status: AC
  Filled 2015-10-06: qty 1

## 2015-10-06 MED ORDER — LACTATED RINGERS IV SOLN
INTRAVENOUS | Status: DC | PRN
  Administered 2015-10-06: 12:00:00 via INTRAVENOUS

## 2015-10-06 MED ORDER — ALBUTEROL SULFATE HFA 108 (90 BASE) MCG/ACT IN AERS
INHALATION_SPRAY | RESPIRATORY_TRACT | Status: DC | PRN
  Administered 2015-10-06 (×3): 2 via RESPIRATORY_TRACT

## 2015-10-06 MED ORDER — PROPOFOL 10 MG/ML IV BOLUS
INTRAVENOUS | Status: DC | PRN
  Administered 2015-10-06: 300 mg via INTRAVENOUS

## 2015-10-06 MED ORDER — MIDAZOLAM HCL 2 MG/2ML IJ SOLN
INTRAMUSCULAR | Status: AC
  Filled 2015-10-06: qty 2

## 2015-10-06 MED ORDER — VANCOMYCIN HCL 10 G IV SOLR
2500.0000 mg | Freq: Once | INTRAVENOUS | Status: AC
  Administered 2015-10-06: 2500 mg via INTRAVENOUS
  Filled 2015-10-06: qty 2000

## 2015-10-06 MED ORDER — ENOXAPARIN SODIUM 100 MG/ML ~~LOC~~ SOLN
90.0000 mg | Freq: Every day | SUBCUTANEOUS | Status: DC
  Filled 2015-10-06: qty 1

## 2015-10-06 MED ORDER — SODIUM CHLORIDE 0.9 % IJ SOLN
3.0000 mL | Freq: Two times a day (BID) | INTRAMUSCULAR | Status: DC
  Administered 2015-10-06: 3 mL via INTRAVENOUS

## 2015-10-06 MED ORDER — INSULIN ASPART 100 UNIT/ML ~~LOC~~ SOLN
5.0000 [IU] | Freq: Once | SUBCUTANEOUS | Status: AC
  Administered 2015-10-06: 5 [IU] via SUBCUTANEOUS

## 2015-10-06 MED ORDER — INSULIN ASPART 100 UNIT/ML ~~LOC~~ SOLN: 0.0000 [IU] | Freq: Three times a day (TID) | SUBCUTANEOUS | Status: DC

## 2015-10-06 MED ORDER — ENOXAPARIN SODIUM 100 MG/ML ~~LOC~~ SOLN: 90.0000 mg | Freq: Every day | SUBCUTANEOUS | Status: DC

## 2015-10-06 MED ORDER — METFORMIN HCL 500 MG PO TABS
500.0000 mg | ORAL_TABLET | Freq: Two times a day (BID) | ORAL | Status: DC
  Filled 2015-10-06 (×3): qty 1

## 2015-10-06 MED ORDER — DEXTROSE 50 % IV SOLN: 25.0000 mL | INTRAVENOUS | Status: DC | PRN

## 2015-10-06 MED ORDER — KETOROLAC TROMETHAMINE 30 MG/ML IJ SOLN
30.0000 mg | Freq: Once | INTRAMUSCULAR | Status: AC
  Administered 2015-10-06: 30 mg via INTRAVENOUS
  Filled 2015-10-06: qty 1

## 2015-10-06 MED ORDER — POTASSIUM CHLORIDE IN NACL 20-0.9 MEQ/L-% IV SOLN
INTRAVENOUS | Status: AC
  Filled 2015-10-06: qty 1000

## 2015-10-06 MED ORDER — FENTANYL CITRATE (PF) 100 MCG/2ML IJ SOLN
INTRAMUSCULAR | Status: DC | PRN
Start: 2015-10-06 — End: 2015-10-06
  Administered 2015-10-06: 100 ug via INTRAVENOUS

## 2015-10-06 MED ORDER — PANTOPRAZOLE SODIUM 40 MG PO TBEC
40.0000 mg | DELAYED_RELEASE_TABLET | Freq: Every day | ORAL | Status: DC
  Administered 2015-10-07: 40 mg via ORAL
  Filled 2015-10-06 (×2): qty 1

## 2015-10-06 MED ORDER — LIDOCAINE HCL (CARDIAC) 20 MG/ML IV SOLN
INTRAVENOUS | Status: AC
  Filled 2015-10-06: qty 5

## 2015-10-06 MED ORDER — SODIUM CHLORIDE 0.9 % IV SOLN: INTRAVENOUS | Status: DC

## 2015-10-06 MED ORDER — FENTANYL CITRATE (PF) 250 MCG/5ML IJ SOLN
INTRAMUSCULAR | Status: AC
  Filled 2015-10-06: qty 5

## 2015-10-06 MED ORDER — ROCURONIUM BROMIDE 100 MG/10ML IV SOLN
INTRAVENOUS | Status: DC | PRN
  Administered 2015-10-06: 10 mg via INTRAVENOUS
  Administered 2015-10-06: 5 mg via INTRAVENOUS
  Administered 2015-10-06: 15 mg via INTRAVENOUS

## 2015-10-06 MED ORDER — HYDROMORPHONE HCL 1 MG/ML IJ SOLN
0.2500 mg | INTRAMUSCULAR | Status: DC | PRN
  Administered 2015-10-06 (×2): 0.5 mg via INTRAVENOUS

## 2015-10-06 MED ORDER — 0.9 % SODIUM CHLORIDE (POUR BTL) OPTIME
TOPICAL | Status: DC | PRN
  Administered 2015-10-06: 1000 mL

## 2015-10-06 MED ORDER — SUGAMMADEX SODIUM 500 MG/5ML IV SOLN
INTRAVENOUS | Status: AC
  Filled 2015-10-06: qty 5

## 2015-10-06 MED ORDER — PIPERACILLIN-TAZOBACTAM 3.375 G IVPB
3.3750 g | Freq: Three times a day (TID) | INTRAVENOUS | Status: DC
  Administered 2015-10-06 – 2015-10-07 (×3): 3.375 g via INTRAVENOUS
  Filled 2015-10-06 (×5): qty 50

## 2015-10-06 MED ORDER — OXYCODONE HCL 5 MG PO TABS
5.0000 mg | ORAL_TABLET | ORAL | Status: DC | PRN
  Administered 2015-10-07: 10 mg via ORAL
  Filled 2015-10-06: qty 2

## 2015-10-06 MED ORDER — ENOXAPARIN SODIUM 40 MG/0.4ML ~~LOC~~ SOLN: 40.0000 mg | SUBCUTANEOUS | Status: DC

## 2015-10-06 MED ORDER — METFORMIN HCL 500 MG PO TABS: 500.0000 mg | ORAL_TABLET | Freq: Two times a day (BID) | ORAL | Status: DC

## 2015-10-06 MED ORDER — HYDROMORPHONE HCL 1 MG/ML IJ SOLN: 1.0000 mg | INTRAMUSCULAR | Status: DC | PRN

## 2015-10-06 SURGICAL SUPPLY — 27 items
BENZOIN TINCTURE PRP APPL 2/3 (GAUZE/BANDAGES/DRESSINGS) ×3 IMPLANT
BLADE HEX COATED 2.75 (ELECTRODE) ×3 IMPLANT
BLADE SURG 15 STRL LF DISP TIS (BLADE) ×1 IMPLANT
BLADE SURG 15 STRL SS (BLADE) ×2
COVER SURGICAL LIGHT HANDLE (MISCELLANEOUS) ×3 IMPLANT
DRSG PAD ABDOMINAL 8X10 ST (GAUZE/BANDAGES/DRESSINGS) ×3 IMPLANT
ELECT PENCIL ROCKER SW 15FT (MISCELLANEOUS) ×3 IMPLANT
ELECT REM PT RETURN 9FT ADLT (ELECTROSURGICAL) ×3
ELECTRODE REM PT RTRN 9FT ADLT (ELECTROSURGICAL) ×1 IMPLANT
GAUZE SPONGE 4X4 12PLY STRL (GAUZE/BANDAGES/DRESSINGS) ×3 IMPLANT
GAUZE SPONGE 4X4 16PLY XRAY LF (GAUZE/BANDAGES/DRESSINGS) ×3 IMPLANT
GLOVE BIOGEL PI IND STRL 7.0 (GLOVE) ×1 IMPLANT
GLOVE BIOGEL PI INDICATOR 7.0 (GLOVE) ×2
GLOVE SURG SIGNA 7.5 PF LTX (GLOVE) ×3 IMPLANT
GOWN SPEC L4 XLG W/TWL (GOWN DISPOSABLE) ×3 IMPLANT
GOWN STRL REUS W/ TWL XL LVL3 (GOWN DISPOSABLE) ×3 IMPLANT
GOWN STRL REUS W/TWL LRG LVL3 (GOWN DISPOSABLE) ×3 IMPLANT
GOWN STRL REUS W/TWL XL LVL3 (GOWN DISPOSABLE) ×6
KIT BASIN OR (CUSTOM PROCEDURE TRAY) ×3 IMPLANT
NEEDLE HYPO 25X1 1.5 SAFETY (NEEDLE) IMPLANT
PACK LITHOTOMY IV (CUSTOM PROCEDURE TRAY) ×3 IMPLANT
SOL PREP PROV IODINE SCRUB 4OZ (MISCELLANEOUS) ×3 IMPLANT
SWAB COLLECTION DEVICE MRSA (MISCELLANEOUS) ×3 IMPLANT
SYR CONTROL 10ML LL (SYRINGE) IMPLANT
TOWEL OR 17X26 10 PK STRL BLUE (TOWEL DISPOSABLE) ×3 IMPLANT
UNDERPAD 30X30 INCONTINENT (UNDERPADS AND DIAPERS) ×6 IMPLANT
YANKAUER SUCT BULB TIP 10FT TU (MISCELLANEOUS) ×3 IMPLANT

## 2015-10-06 NOTE — Progress Notes (Signed)
Rx brief note:  Lovenox  Pt with wt=187 kg, BMI=46.5 ordered Lovenox 40mg  daily for DVT prophylaxis  Rx adjusted Lovenox to 90mg  (~0.5 mg/kg) daily in pt with BMI>30  Thanks Lorenza EvangelistGreen, Barnie Sopko R 10/06/2015 1:27 AM

## 2015-10-06 NOTE — Plan of Care (Signed)
Problem: Skin Integrity: Goal: Risk for impaired skin integrity will decrease Outcome: Progressing Discussed need to get and keep diabetes under control to decrease skin issues

## 2015-10-06 NOTE — Transfer of Care (Signed)
Immediate Anesthesia Transfer of Care Note  Patient: Shawn Benson  Procedure(s) Performed: Procedure(s): IRRIGATION AND DEBRIDEMENT PERIANAL  ABSCESS (N/A)  Patient Location: PACU  Anesthesia Type:General  Level of Consciousness: awake, alert , oriented and patient cooperative  Airway & Oxygen Therapy: Patient Spontanous Breathing and Patient connected to face mask oxygen  Post-op Assessment: Report given to RN, Post -op Vital signs reviewed and stable and Patient moving all extremities  Post vital signs: Reviewed and stable  Last Vitals:  Filed Vitals:   10/06/15 0523 10/06/15 0944  BP: 109/55 108/55  Pulse: 67 81  Temp: 37.2 C 36.3 C  Resp: 20 19    Complications: No apparent anesthesia complications

## 2015-10-06 NOTE — Progress Notes (Signed)
PROGRESS NOTE  Shawn Benson WJX:[id number] DOB: [birth year] DOA: 10/05/2015 PCP: No primary care provider on file.  HPI/Recap of past 94 hours: 34 year old male with past history of morbid obesity, diabetes mellitus type 2 receiving discharged only a few days ago for DKA return back on 12/28 after noticing some drainage from a small area on his buttock. He noticed this area a few days ago, but that time was more of an induration and not amenable for I&D. Patient was started on Bactrim from previous hospitalization, but came to emergency room on 12/28 after symptoms worsened. In the emergency room, patient noted to have a mild leukocytosis, elevated blood sugars in the mid 200s and a CT scan done noted multiple small abscesses in the right buttock. Patient started on IV antibiotics and admitted to hospitalist service. The patient was seen by general surgery who plan to take patient to the operating room later today.  Assessment/Plan: Principal Problem:   Abscess of right buttock:: For debridement later today. Continue antibiotics. Neuro coverage pending cultures Active Problems:  Morbid obesity secondary to excess calories: Patient meets criteria with BMI greater than 40.    Diabetes mellitus type 2 in obese (HCC) not well-controlled with other skin complications: While in surgery, insulin drip given persistently elevated sugars. Postprocedure once by mouth, will change back to home medications   Asthma: Stable   Code Status: Full code  Family Communication: No family present   Disposition Plan: Anticipate discharge in the next 24-48 hours if no complications   Consultants:  General surgery   Procedures:  Planned I&D of right buttock abscesses on 12/29   Antibiotics:  IV vancomycin 12/29-present  IV Zosyn 12/29-present   Objective: BP 108/55 mmHg  Pulse 81  Temp(Src) 97.4 F (36.3 C) (Oral)  Resp 19  Ht  (2.007 m)  Wt 187.336 kg (413 lb)  BMI 46.51 kg/m2   SpO2 98%  Intake/Output Summary (Last 24 hours) at 10/06/15 1230 Last data filed at 10/06/15 0946  Gross per 24 hour  Intake 688.33 ml  Output    700 ml  Net -11.67 ml   Filed Weights   10/05/15 2312  Weight: 187.336 kg (413 lb)    Exam:   General:  Alert and oriented 3  Cardiovascular: Regular rate and rhythm, S1-S2   Respiratory: Clear to auscultation bilaterally   Abdomen: Soft, obese, nontender, positive bowel sounds   Musculoskeletal: No clubbing or cyanosis or edema    Data Reviewed: Basic Metabolic Panel:  Recent Labs Lab 10/01/15 0424 10/02/15 0415 10/03/15 0420 10/05/15 1702 10/06/15 0538  NA 139 138 137 134* 138  K 3.3* 4.2 4.2 3.7 3.7  CL 105 103 103 101 102  CO2 GLUCOSE 205* 333* 328* 328* 300*  BUN CREATININE 0.86 0.90 0.99 0.80 0.92  CALCIUM 9.0 8.7* 8.9 8.7* 8.4*  MG  --   --   --  1.6* 1.7  PHOS  --   --   --   --  3.8   Liver Function Tests:  Recent Labs Lab 09/30/15 1258 10/01/15 0424 10/06/15 0538  AST ALT 23 20 35  ALKPHOS 114 90 67  BILITOT 1.2 1.0 0.9  PROT 8.0 7.3 6.3*  ALBUMIN 4.1 3.6 2.9*   No results for input(s): LIPASE, AMYLASE in the last 168 hours. No results for input(s): AMMONIA in the last 168 hours. CBC:  Recent Labs  Lab 09/30/15 1255 10/01/15 0424 10/05/15 1702 10/06/15 0538  WBC 12.8* 12.8* 13.5* 13.6*  NEUTROABS  --   --  10.3* 9.7*  HGB 13.3 12.8* 12.7* 10.8*  HCT 41.1 39.3 38.3* 33.7*  MCV 79.3 78.6 79.8 79.7  PLT 290 271 282 261   Cardiac Enzymes:   No results for input(s): CKTOTAL, CKMB, CKMBINDEX, TROPONINI in the last 168 hours. BNP (last 3 results) No results for input(s): BNP in the last 8760 hours.  ProBNP (last 3 results) No results for input(s): PROBNP in the last 8760 hours.  CBG:  Recent Labs Lab 10/05/15 1337 10/05/15 2024 10/05/15 2257 10/06/15 0810 10/06/15 0949  GLUCAP 258* 295* 295* 266* 273*    No results found for this or  any previous visit (from the past 240 hour(s)).   Studies: Ct Abdomen Pelvis W Contrast  10/05/2015  CLINICAL DATA:  [age] year old male with right gluteal abscess. EXAM: CT ABDOMEN AND PELVIS WITH CONTRAST TECHNIQUE: Multidetector CT imaging of the abdomen and pelvis was performed using the standard protocol following bolus administration of intravenous contrast. CONTRAST:  100mL OMNIPAQUE IOHEXOL 300 MG/ML  SOLN COMPARISON:  None. FINDINGS: Visualized lung bases are clear. No intra-abdominal free air or free fluid. Diffuse fatty infiltration of the liver. The gallbladder, pancreas, spleen, adrenal glands, kidneys, visualized ureters, and urinary bladder appear unremarkable. The prostate and seminal vesicles are grossly unremarkable. There is moderate stool throughout the colon. No evidence of bowel obstruction or inflammation. Normal appendix. The abdominal aorta and IVC appear unremarkable on this noncontrast study. No portal venous gas identified. There is no adenopathy. There is diffuse inflammatory changes and stranding of the subcutaneous soft tissues of the right gluteal region. There are two more localized fluid attenuating foci measuring 2.3 x 2.7 cm and 3.0 x 2.3 cm approximately 1.5 cm deep into the skin of the fissure of the buttocks concerning for phlegmon or developing abscess. The more superficial collection appears to extend to the posterior skin surface of the right buttock. Ultrasound may provide better evaluation. The osseous structures are unremarkable. IMPRESSION: Inflammatory changes of the subcutaneous soft tissues of the right buttock with multiple small fluid collections in the superficial subcutaneous soft tissues approximately 1.0-1.5 cm deep to the skin. There is apparent extension of the superficial collection to the skin surface. Correlation with clinical exam is recommended. Ultrasound may provide additional evaluation. No acute intra-abdominal pelvic pathology. Electronically  Signed   By: Elgie CollardArash  Radparvar M.D.   On: 10/05/2015 18:37    Scheduled Meds: . [MAR Hold] enoxaparin (LOVENOX) injection  90 mg Subcutaneous Daily  . Influenza vac split quadrivalent PF  0.5 mL Intramuscular Tomorrow-1000  . [MAR Hold] pantoprazole  40 mg Oral Daily  . [MAR Hold] piperacillin-tazobactam (ZOSYN)  IV  3.375 g Intravenous Q8H  . pneumococcal 23 valent vaccine  0.5 mL Intramuscular Tomorrow-1000  . [MAR Hold] sodium chloride  3 mL Intravenous Q12H  . [MAR Hold] vancomycin  1,500 mg Intravenous Q8H    Continuous Infusions: . 0.9 % NaCl with KCl 20 mEq / L 100 mL/hr at 10/06/15 0205  . insulin (NOVOLIN-R) infusion 2.2 Units/hr (10/06/15 1210)     Time spent: 15 minutes   Hollice EspyKRISHNAN,Vylette Strubel K  [facility] Pager [phone] . If 7PM-7AM, please contact night-coverage at [email or web address], password [facility] 10/06/2015, 12:30 PM  LOS: 0 days

## 2015-10-06 NOTE — Anesthesia Procedure Notes (Signed)
Procedure Name: Intubation Date/Time: 10/06/2015 12:32 PM Performed by: Jarvis NewcomerARMISTEAD, Nyella Eckels A Pre-anesthesia Checklist: Patient identified, Emergency Drugs available, Suction available and Patient being monitored Patient Re-evaluated:Patient Re-evaluated prior to inductionOxygen Delivery Method: Circle system utilized Preoxygenation: Pre-oxygenation with 100% oxygen Intubation Type: IV induction Laryngoscope Size: Mac and 3 Grade View: Grade I Tube type: Oral Tube size: 7.5 mm Number of attempts: 1 Airway Equipment and Method: Stylet (pillow under shoulders for sniffing position) Placement Confirmation: positive ETCO2,  breath sounds checked- equal and bilateral and ETT inserted through vocal cords under direct vision Secured at: 23 cm Tube secured with: Tape Dental Injury: Teeth and Oropharynx as per pre-operative assessment

## 2015-10-06 NOTE — Progress Notes (Signed)
ANTIBIOTIC CONSULT NOTE - INITIAL  Pharmacy Consult for Zosyn/Vancomycin Indication: Wound infection  No Known Allergies  Patient Measurements: Height: '6\' 7"'  (200.7 cm) Weight: (!) 413 lb (187.336 kg) IBW/kg (Calculated) : 93.7   Vital Signs: Temp: 99 F (37.2 C) (12/28 2250) Temp Source: Oral (12/28 2250) BP: 121/59 mmHg (12/28 2250) Pulse Rate: 84 (12/28 2250) Intake/Output from previous day:   Intake/Output from this shift:    Labs:  Recent Labs  10/03/15 0420 10/05/15 1702  WBC  --  13.5*  HGB  --  12.7*  PLT  --  282  CREATININE 0.99 0.80   Estimated Creatinine Clearance: 236.7 mL/min (by C-G formula based on Cr of 0.8). No results for input(s): VANCOTROUGH, VANCOPEAK, VANCORANDOM, GENTTROUGH, GENTPEAK, GENTRANDOM, TOBRATROUGH, TOBRAPEAK, TOBRARND, AMIKACINPEAK, AMIKACINTROU, AMIKACIN in the last 72 hours.   Microbiology: No results found for this or any previous visit (from the past 720 hour(s)).  Medical History: Past Medical History  Diagnosis Date  . Asthma   . Diabetes mellitus without complication ([place])     Medications:  Prescriptions prior to admission  Medication Sig Dispense Refill Last Dose  . insulin aspart protamine- aspart (NOVOLOG MIX 70/30) (70-30) 100 UNIT/ML injection Inject 0.35 mLs (35 Units total) into the skin 2 (two) times daily with a meal. 10 mL 0 10/05/2015 at Unknown time  . metFORMIN (GLUCOPHAGE) 500 MG tablet Take 1 tablet (500 mg total) by mouth 2 (two) times daily with a meal. 60 tablet 0 10/05/2015 at Unknown time  . sulfamethoxazole-trimethoprim (BACTRIM DS,SEPTRA DS) 800-160 MG tablet Take 1 tablet by mouth every 12 (twelve) hours. 6 tablet 0 10/05/2015 at Unknown time  . glucose blood test strip Use as instructed 100 each 0   . glucose monitoring kit (FREESTYLE) monitoring kit 1 each by Does not apply route as needed for other. 1 each 0   . Insulin Syringe-Needle U-100 (INSULIN SYRINGE .5CC/31GX5/16") 31G X 5/16" 0.5 ML  MISC 1 Device by Does not apply route 2 (two) times daily with a meal. 100 each 0   . Lancets (FREESTYLE) lancets Use as instructed 100 each 0    Scheduled:  . enoxaparin (LOVENOX) injection  90 mg Subcutaneous Daily  . Influenza vac split quadrivalent PF  0.5 mL Intramuscular Tomorrow-1000  . insulin aspart  0-20 Units Subcutaneous TID WC  . insulin aspart protamine- aspart  35 Units Subcutaneous BID WC  . ketorolac  30 mg Intravenous Once  . metFORMIN  500 mg Oral BID WC  . pantoprazole  40 mg Oral Daily  . piperacillin-tazobactam (ZOSYN)  IV  3.375 g Intravenous Once  . pneumococcal 23 valent vaccine  0.5 mL Intramuscular Tomorrow-1000  . sodium chloride  3 mL Intravenous Q12H  . vancomycin  2,500 mg Intravenous Once   Infusions:  . 0.9 % NaCl with KCl 20 mEq / L     Assessment: 54 yoM admitted with worsening gluteal abscess.  Zosyn/Vanomcyin per Rx for worsening gluteal abscess.   Goal of Therapy:  Vancomycin trough level 15-20 mcg/ml  Plan:   Zosyn 3.375 Gm IV q8h EI  Vancomycin 2539m x1 then 15072mIV q8h  F/u Scr/cultures/levels as needed  GrLawana Pai 10/06/2015,1:35 AM

## 2015-10-06 NOTE — H&P (Signed)
[facility] History and Physical  Shawn Benson RDE:[id number] DOB: 11-May-1981 DOA: 10/05/2015  Referring physician: Macarthur Critchley, MD PCP: No primary care provider on file.   Chief Complaint: Right buttock abscess.  HPI: Shawn Benson is a 34 y.o. male  with a past medical history of morbid obesity, type 2 diabetes, who was discharged for DKA on 10/03/2015 and returns due to above chief complaint. The patient states, that about 5 days ago while he was in the hospital he noticed this area to be tender. It showed a small area of induration, which was not amenable for I&D. He was subsequently started on Bactrim and discharged home, but returns to the hospital after the symptoms worsen. He denies fever, chills, but feels fatigue. He is currently in no acute distress.   Review of Systems:  Constitutional:  No weight loss, night sweats, Fevers, chills.  HEENT:  No headaches, Difficulty swallowing,Tooth/dental problems,Sore throat,  No sneezing, itching, ear ache, nasal congestion, post nasal drip,  Cardio-vascular:  No chest pain, Orthopnea, PND, swelling in lower extremities, anasarca, dizziness, palpitations  GI:  No heartburn, indigestion, abdominal pain, nausea, vomiting, diarrhea, change in bowel habits, loss of appetite  Resp:  No shortness of breath with exertion or at rest. No excess mucus, no productive cough, No non-productive cough, No coughing up of blood.No change in color of mucus.No wheezing.No chest wall deformity  Skin:  Right buttock tender abscess. GU:  no dysuria, change in color of urine, no urgency or frequency. No flank pain.  Musculoskeletal:  No joint pain or swelling. No decreased range of motion. No back pain.  Psych:  No change in mood or affect. No depression or anxiety. No memory loss.   Past Medical History  Diagnosis Date  . Asthma   . Diabetes mellitus without complication [facility])    Past Surgical History  Procedure Laterality  Date  . Knee surgery      Right and Left Knee   Social History:  reports that he quit smoking about 4 weeks ago. His smoking use included Cigarettes. He smoked 3.00 packs per day. He has never used smokeless tobacco. He reports that he does not drink alcohol or use illicit drugs.  No Known Allergies  History reviewed. No pertinent family history.   Prior to Admission medications   Medication Sig Start Date End Date Taking? Authorizing Provider  insulin aspart protamine- aspart (NOVOLOG MIX 70/30) (70-30) 100 UNIT/ML injection Inject 0.35 mLs (35 Units total) into the skin 2 (two) times daily with a meal. 10/03/15  Yes Donne Hazel, MD  metFORMIN (GLUCOPHAGE) 500 MG tablet Take 1 tablet (500 mg total) by mouth 2 (two) times daily with a meal. 10/03/15  Yes Donne Hazel, MD  sulfamethoxazole-trimethoprim (BACTRIM DS,SEPTRA DS) 800-160 MG tablet Take 1 tablet by mouth every 12 (twelve) hours. 10/03/15  Yes Donne Hazel, MD  glucose blood test strip Use as instructed 10/03/15   Donne Hazel, MD  glucose monitoring kit (FREESTYLE) monitoring kit 1 each by Does not apply route as needed for other. 10/03/15   Donne Hazel, MD  Insulin Syringe-Needle U-100 (INSULIN SYRINGE .5CC/31GX5/16") 31G X 5/16" 0.5 ML MISC 1 Device by Does not apply route 2 (two) times daily with a meal. 10/03/15   Donne Hazel, MD  Lancets (FREESTYLE) lancets Use as instructed 10/03/15   Donne Hazel, MD   Physical Exam: Filed Vitals:   10/05/15 2000 10/05/15 2209 10/05/15 2250 10/05/15  2312  BP: 102/66 108/59 121/59   Pulse: 87 84 84   Temp:  98.7 F (37.1 C) 99 F (37.2 C)   TempSrc:  Oral Oral   Resp:  20    Height:    '6\' 7"'  (2.007 m)  Weight:    187.336 kg (413 lb)  SpO2: 91% 100% 98%     Wt Readings from Last 3 Encounters:  10/05/15 187.336 kg (413 lb)  10/03/15 181.439 kg (400 lb)  03/23/13 181.53 kg (400 lb 3.2 oz)    General:  Appears calm and comfortable Eyes: PERRL, normal lids, irises  & conjunctiva ENT: grossly normal hearing, lips & tongue Neck: no LAD, masses or thyromegaly Cardiovascular: RRR, no m/r/g. No LE edema. Telemetry: SR, no arrhythmias  Respiratory: CTA bilaterally, no w/r/r. Normal respiratory effort. Abdomen: soft, ntnd Skin: Right buttock open abscess with purulent discharge and surrounding erythema. Musculoskeletal: grossly normal tone BUE/BLE Psychiatric: grossly normal mood and affect, speech fluent and appropriate Neurologic: grossly non-focal.          Labs on Admission:  Basic Metabolic Panel:  Recent Labs Lab 09/30/15 1711 10/01/15 0424 10/02/15 0415 10/03/15 0420 10/05/15 1702  NA 139 139 138 137 134*  K 4.5 3.3* 4.2 4.2 3.7  CL 101 105 103 103 101  CO2 '26 25 26 27 24  ' GLUCOSE 432* 205* 333* 328* 328*  BUN '13 12 10 11 8  ' CREATININE 1.02 0.86 0.90 0.99 0.80  CALCIUM 8.8* 9.0 8.7* 8.9 8.7*  MG  --   --   --   --  1.6*   Liver Function Tests:  Recent Labs Lab 09/30/15 1258 10/01/15 0424  AST 16 16  ALT 23 20  ALKPHOS 114 90  BILITOT 1.2 1.0  PROT 8.0 7.3  ALBUMIN 4.1 3.6   CBC:  Recent Labs Lab 09/30/15 1255 10/01/15 0424 10/05/15 1702  WBC 12.8* 12.8* 13.5*  NEUTROABS  --   --  10.3*  HGB 13.3 12.8* 12.7*  HCT 41.1 39.3 38.3*  MCV 79.3 78.6 79.8  PLT 290 271 282    CBG:  Recent Labs Lab 10/03/15 1243 10/03/15 1711 10/05/15 1337 10/05/15 2024 10/05/15 2257  GLUCAP 380* 298* 258* 295* 295*    Radiological Exams on Admission: Ct Abdomen Pelvis W Contrast  10/05/2015  CLINICAL DATA:  34 year old male with right gluteal abscess. EXAM: CT ABDOMEN AND PELVIS WITH CONTRAST TECHNIQUE: Multidetector CT imaging of the abdomen and pelvis was performed using the standard protocol following bolus administration of intravenous contrast. CONTRAST:  170m OMNIPAQUE IOHEXOL 300 MG/ML  SOLN COMPARISON:  None. FINDINGS: Visualized lung bases are clear. No intra-abdominal free air or free fluid. Diffuse fatty  infiltration of the liver. The gallbladder, pancreas, spleen, adrenal glands, kidneys, visualized ureters, and urinary bladder appear unremarkable. The prostate and seminal vesicles are grossly unremarkable. There is moderate stool throughout the colon. No evidence of bowel obstruction or inflammation. Normal appendix. The abdominal aorta and IVC appear unremarkable on this noncontrast study. No portal venous gas identified. There is no adenopathy. There is diffuse inflammatory changes and stranding of the subcutaneous soft tissues of the right gluteal region. There are two more localized fluid attenuating foci measuring 2.3 x 2.7 cm and 3.0 x 2.3 cm approximately 1.5 cm deep into the skin of the fissure of the buttocks concerning for phlegmon or developing abscess. The more superficial collection appears to extend to the posterior skin surface of the right buttock. Ultrasound may provide better evaluation. The  osseous structures are unremarkable. IMPRESSION: Inflammatory changes of the subcutaneous soft tissues of the right buttock with multiple small fluid collections in the superficial subcutaneous soft tissues approximately 1.0-1.5 cm deep to the skin. There is apparent extension of the superficial collection to the skin surface. Correlation with clinical exam is recommended. Ultrasound may provide additional evaluation. No acute intra-abdominal pelvic pathology. Electronically Signed   By: Anner Crete M.D.   On: 10/05/2015 18:37    Assessment/Plan Principal Problem:   Abscess of right buttock Admit to MedSurg. Continue Zosyn. I will add vancomycin to cover gram positives. Contact precautions. Surgery to evaluate for incision and drainage.  Active Problems:   Obesity Continue lifestyle modifications.    Diabetes mellitus type 2 in obese (HCC) Continue long-acting insulin twice a day. CBG monitoring with regular insulin sliding scale.    Asthma As symptomatic at this  time. Bronchodilators as needed.   General surgery was consulted by the emergency department.   Code Status: Full code. DVT Prophylaxis: Lovenox SQ. Family Communication:  Disposition Plan: Admit for IV antibiotics therapy and general surgery evaluation.  Time spent: Over 50 minutes were used in the process of this admission.  Reubin Milan [facility] Pager [phone].

## 2015-10-06 NOTE — Op Note (Signed)
10/05/2015 - 10/06/2015  1:09 PM  PATIENT:  Shawn Benson, 34 y.o., male, MRN: [id number]  PREOP DIAGNOSIS:  perineal abcess  POSTOP DIAGNOSIS:   2 right posterior perianal abscess  PROCEDURE:   Procedure(s): IRRIGATION AND DEBRIDEMENT PERIANAL  ABSCESS  SURGEON:   Ovidio Kinavid Amenda Duclos, M.D.  ASSISTANT:   None  ANESTHESIA:   general  Anesthesiologist: Heather RobertsJames Singer, MD CRNA: Elisabeth CaraLacey A Armistead, CRNA  General  EBL:  75  ml   SPECIMEN:   Aerobe and anaerobic cultures obtained  COUNTS CORRECT:  YES  INDICATIONS FOR PROCEDURE:  Shawn Benson is a 34 y.o. (DOB: 05/23/1981) AA  male whose primary care physician is No primary care provider on file. and comes for I&D of 2 right posterior perianal abscesses.   He was recently diagnosed with new onset DM.  He still has yet to control his diabetes.  He has developed right perianal pain.  A CT scan of the pelvis (10/05/2015) shows 2 areas of subcutaneous inflammation in the right buttocks near the anus.   The indications and risks of the surgery were explained to the patient.  The risks include, but are not limited to, infection, bleeding, and nerve injury.  PROCEDURE NOTE:   The patient was placed in lithotomy position.  The perineum, buttocks, and perianal area were prepped with betadine.   A time out was held and the surgical checklist run.   The patient had a draining abscess in the right posterior perianal area.  I put a hemostat in this, promoted some more drainage, and obtained cultures.  I then extended this incision medially and laterally.  Medially there was a deeper and larger abscess - probably about 3 cm in diameter.   I ended up opening an area 6 x 3 cm, connecting both abscesses.  I irrigated the wounds with 500 cc of saline.  Then I packed the wound with 2 saline gauze.     The wound was sterilely dressed.  We will start dressing changes tomorrow.  Ovidio Kinavid Ezequias Lard, MD, [facility] Pager:  [phone] Office phone:  [phone]

## 2015-10-06 NOTE — Consult Note (Signed)
Shawn Benson 02/07/1981  [id number].   Requesting MD: Dr. Weston Anna Chief Complaint/Reason for Consult: right buttock abscess HPI: This is a 34 yo obese black male who was just admitted last week in DKA and was found to have new onset DM.  He complained of some buttock pain at the time of discharge.  He apparently just had some induration noted.  He was discharged on Bactrim DS.  Since home, this has worsened and he feels as if he is sitting on a golf ball.  It is painful to have a BM.  He denies any fevers at home.  He returned to the [facility] last night due to worsening buttock pain.  He had a CT scan that revealed several small buttock abscesses.  He was admitted to the medicine service for continued management of his uncontrolled DM.  We have been consulted to management of his abscesses.  ROS : Please see HPI, otherwise all other systems are negative  History reviewed. No pertinent family history.  Past Medical History  Diagnosis Date  . Asthma   . Diabetes mellitus without complication [facility])     Past Surgical History  Procedure Laterality Date  . Knee surgery      Right and Left Knee    Social History:  reports that he quit smoking about 4 weeks ago. His smoking use included Cigarettes. He smoked 3.00 packs per day. He has never used smokeless tobacco. He reports that he does not drink alcohol or use illicit drugs.  Allergies: No Known Allergies  Medications Prior to Admission  Medication Sig Dispense Refill  . insulin aspart protamine- aspart (NOVOLOG MIX 70/30) (70-30) 100 UNIT/ML injection Inject 0.35 mLs (35 Units total) into the skin 2 (two) times daily with a meal. 10 mL 0  . metFORMIN (GLUCOPHAGE) 500 MG tablet Take 1 tablet (500 mg total) by mouth 2 (two) times daily with a meal. 60 tablet 0  . sulfamethoxazole-trimethoprim (BACTRIM DS,SEPTRA DS) 800-160 MG tablet Take 1 tablet by mouth every 12 (twelve) hours. 6 tablet 0  . glucose blood test strip Use as  instructed 100 each 0  . glucose monitoring kit (FREESTYLE) monitoring kit 1 each by Does not apply route as needed for other. 1 each 0  . Insulin Syringe-Needle U-100 (INSULIN SYRINGE .5CC/31GX5/16") 31G X 5/16" 0.5 ML MISC 1 Device by Does not apply route 2 (two) times daily with a meal. 100 each 0  . Lancets (FREESTYLE) lancets Use as instructed 100 each 0    Blood pressure 109/55, pulse 67, temperature 98.9 F (37.2 C), temperature source Oral, resp. rate 20, height '6\' 7"'  (2.007 m), weight 187.336 kg (413 lb), SpO2 99 %. Physical Exam: General: pleasant, morbidly obese black male who is laying in bed in NAD HEENT: head is normocephalic, atraumatic.  Sclera are noninjected.  PERRL.  Ears and nose without any masses or lesions.  Mouth is pink and moist Heart: regular, rate, and rhythm.  Normal s1,s2. No obvious murmurs, gallops, or rubs noted.  Palpable radial and pedal pulses bilaterally Lungs: CTAB, no wheezes, rhonchi, or rales noted.  Respiratory effort nonlabored Abd: soft, NT, ND, +BS, no masses, hernias, or organomegaly Buttock: right side has a spontaneous opening near his rectum with thin purulent drainage.  He has multiple other small pinpoint "white heads" that are easily scratched off.  He does have some other small areas of induration that do not have fluctuance currently. Skin: warm and dry with no masses, lesions, or  rashes Psych: A&Ox3 with an appropriate affect.    Results for orders placed or performed during the hospital encounter of 10/05/15 (from the past 48 hour(s))  CBG monitoring, ED     Status: Abnormal   Collection Time: 10/05/15  1:37 PM  Result Value Ref Range   Glucose-Capillary 258 (H) 65 - 99 mg/dL  CBC with Differential/Platelet     Status: Abnormal   Collection Time: 10/05/15  5:02 PM  Result Value Ref Range   WBC 13.5 (H) 4.0 - 10.5 K/uL   RBC 4.80 4.22 - 5.81 MIL/uL   Hemoglobin 12.7 (L) 13.0 - 17.0 g/dL   HCT 38.3 (L) 39.0 - 52.0 %   MCV 79.8 78.0  - 100.0 fL   MCH 26.5 26.0 - 34.0 pg   MCHC 33.2 30.0 - 36.0 g/dL   RDW 14.4 11.5 - 15.5 %   Platelets 282 150 - 400 K/uL   Neutrophils Relative % 77 %   Neutro Abs 10.3 (H) 1.7 - 7.7 K/uL   Lymphocytes Relative 15 %   Lymphs Abs 2.1 0.7 - 4.0 K/uL   Monocytes Relative 6 %   Monocytes Absolute 0.8 0.1 - 1.0 K/uL   Eosinophils Relative 2 %   Eosinophils Absolute 0.3 0.0 - 0.7 K/uL   Basophils Relative 0 %   Basophils Absolute 0.0 0.0 - 0.1 K/uL  Basic metabolic panel     Status: Abnormal   Collection Time: 10/05/15  5:02 PM  Result Value Ref Range   Sodium 134 (L) 135 - 145 mmol/L   Potassium 3.7 3.5 - 5.1 mmol/L   Chloride 101 101 - 111 mmol/L   CO2 24 22 - 32 mmol/L   Glucose, Bld 328 (H) 65 - 99 mg/dL   BUN 8 6 - 20 mg/dL   Creatinine, Ser 0.80 0.61 - 1.24 mg/dL   Calcium 8.7 (L) 8.9 - 10.3 mg/dL   GFR calc non Af Amer >60 >60 mL/min   GFR calc Af Amer >60 >60 mL/min    Comment: (NOTE) The eGFR has been calculated using the CKD EPI equation. This calculation has not been validated in all clinical situations. eGFR's persistently <60 mL/min signify possible Chronic Kidney Disease.    Anion gap 9 5 - 15  Magnesium     Status: Abnormal   Collection Time: 10/05/15  5:02 PM  Result Value Ref Range   Magnesium 1.6 (L) 1.7 - 2.4 mg/dL  CBG monitoring, ED     Status: Abnormal   Collection Time: 10/05/15  8:24 PM  Result Value Ref Range   Glucose-Capillary 295 (H) 65 - 99 mg/dL   Comment 1 Call MD NNP PA CNM   Glucose, capillary     Status: Abnormal   Collection Time: 10/05/15 10:57 PM  Result Value Ref Range   Glucose-Capillary 295 (H) 65 - 99 mg/dL  Comprehensive metabolic panel     Status: Abnormal   Collection Time: 10/06/15  5:38 AM  Result Value Ref Range   Sodium 138 135 - 145 mmol/L   Potassium 3.7 3.5 - 5.1 mmol/L   Chloride 102 101 - 111 mmol/L   CO2 27 22 - 32 mmol/L   Glucose, Bld 300 (H) 65 - 99 mg/dL   BUN 9 6 - 20 mg/dL   Creatinine, Ser 0.92 0.61 -  1.24 mg/dL   Calcium 8.4 (L) 8.9 - 10.3 mg/dL   Total Protein 6.3 (L) 6.5 - 8.1 g/dL   Albumin 2.9 (L) 3.5 -  5.0 g/dL   AST 16 15 - 41 U/L   ALT 35 17 - 63 U/L   Alkaline Phosphatase 67 38 - 126 U/L   Total Bilirubin 0.9 0.3 - 1.2 mg/dL   GFR calc non Af Amer >60 >60 mL/min   GFR calc Af Amer >60 >60 mL/min    Comment: (NOTE) The eGFR has been calculated using the CKD EPI equation. This calculation has not been validated in all clinical situations. eGFR's persistently <60 mL/min signify possible Chronic Kidney Disease.    Anion gap 9 5 - 15  CBC WITH DIFFERENTIAL     Status: Abnormal   Collection Time: 10/06/15  5:38 AM  Result Value Ref Range   WBC 13.6 (H) 4.0 - 10.5 K/uL   RBC 4.23 4.22 - 5.81 MIL/uL   Hemoglobin 10.8 (L) 13.0 - 17.0 g/dL   HCT 33.7 (L) 39.0 - 52.0 %   MCV 79.7 78.0 - 100.0 fL   MCH 25.5 (L) 26.0 - 34.0 pg   MCHC 32.0 30.0 - 36.0 g/dL   RDW 14.6 11.5 - 15.5 %   Platelets 261 150 - 400 K/uL   Neutrophils Relative % 72 %   Neutro Abs 9.7 (H) 1.7 - 7.7 K/uL   Lymphocytes Relative 17 %   Lymphs Abs 2.4 0.7 - 4.0 K/uL   Monocytes Relative 8 %   Monocytes Absolute 1.0 0.1 - 1.0 K/uL   Eosinophils Relative 3 %   Eosinophils Absolute 0.4 0.0 - 0.7 K/uL   Basophils Relative 0 %   Basophils Absolute 0.0 0.0 - 0.1 K/uL  Magnesium     Status: None   Collection Time: 10/06/15  5:38 AM  Result Value Ref Range   Magnesium 1.7 1.7 - 2.4 mg/dL  Phosphorus     Status: None   Collection Time: 10/06/15  5:38 AM  Result Value Ref Range   Phosphorus 3.8 2.5 - 4.6 mg/dL  Protime-INR     Status: Abnormal   Collection Time: 10/06/15  5:38 AM  Result Value Ref Range   Prothrombin Time 15.4 (H) 11.6 - 15.2 seconds   INR 1.20 0.00 - 1.49  APTT     Status: None   Collection Time: 10/06/15  5:38 AM  Result Value Ref Range   aPTT 28 24 - 37 seconds   Ct Abdomen Pelvis W Contrast  10/05/2015  CLINICAL DATA:  34 year old male with right gluteal abscess. EXAM: CT ABDOMEN  AND PELVIS WITH CONTRAST TECHNIQUE: Multidetector CT imaging of the abdomen and pelvis was performed using the standard protocol following bolus administration of intravenous contrast. CONTRAST:  135m OMNIPAQUE IOHEXOL 300 MG/ML  SOLN COMPARISON:  None. FINDINGS: Visualized lung bases are clear. No intra-abdominal free air or free fluid. Diffuse fatty infiltration of the liver. The gallbladder, pancreas, spleen, adrenal glands, kidneys, visualized ureters, and urinary bladder appear unremarkable. The prostate and seminal vesicles are grossly unremarkable. There is moderate stool throughout the colon. No evidence of bowel obstruction or inflammation. Normal appendix. The abdominal aorta and IVC appear unremarkable on this noncontrast study. No portal venous gas identified. There is no adenopathy. There is diffuse inflammatory changes and stranding of the subcutaneous soft tissues of the right gluteal region. There are two more localized fluid attenuating foci measuring 2.3 x 2.7 cm and 3.0 x 2.3 cm approximately 1.5 cm deep into the skin of the fissure of the buttocks concerning for phlegmon or developing abscess. The more superficial collection appears to extend to the posterior  skin surface of the right buttock. Ultrasound may provide better evaluation. The osseous structures are unremarkable. IMPRESSION: Inflammatory changes of the subcutaneous soft tissues of the right buttock with multiple small fluid collections in the superficial subcutaneous soft tissues approximately 1.0-1.5 cm deep to the skin. There is apparent extension of the superficial collection to the skin surface. Correlation with clinical exam is recommended. Ultrasound may provide additional evaluation. No acute intra-abdominal pelvic pathology. Electronically Signed   By: Anner Crete M.D.   On: 10/05/2015 18:37       Assessment/Plan 1. Right buttock abscesses -NPO -plan for OR today for I&D of abscesses -hold lovenox for today for  procedure 2. DM -per primary service.  OSBORNE,KELLY E 10/06/2015, 8:03 AM Pager: [phone]  Agree with above. To OR later today. Discussed with patient that weight and diabetes are contributors to these infections.  Alphonsa Overall, MD, [facility] Pager: [phone] Office phone:  [phone]

## 2015-10-06 NOTE — Progress Notes (Signed)
Dr Rito EhrlichKrishnan texted  CCs is  Taking patient to OR around noon. Questioned for any changes to am insulin. Instructed to hold 70/30 this am .Informed him am cbg=266. Instructed to give 5 units regular insulin.  Metformin held with his permission due to CT scan last night  With contrast. Pharmacy consulted

## 2015-10-06 NOTE — Anesthesia Postprocedure Evaluation (Signed)
Anesthesia Post Note  Patient: Shawn Benson  Procedure(s) Performed: Procedure(s) (LRB): IRRIGATION AND DEBRIDEMENT PERIANAL  ABSCESS (N/A)  Patient location during evaluation: PACU Anesthesia Type: General Level of consciousness: sedated Pain management: pain level controlled Vital Signs Assessment: post-procedure vital signs reviewed and stable Respiratory status: spontaneous breathing and respiratory function stable Cardiovascular status: stable Anesthetic complications: no    Last Vitals:  Filed Vitals:   10/06/15 1415 10/06/15 1430  BP: 118/79 124/80  Pulse: 79 74  Temp:  36.7 C  Resp: 21 19    Last Pain:  Filed Vitals:   10/06/15 1430  PainSc: 7                  Sohan Potvin DANIEL

## 2015-10-06 NOTE — Anesthesia Preprocedure Evaluation (Addendum)
Anesthesia Evaluation  Patient identified by MRN, date of birth, ID band Patient awake    Reviewed: Allergy & Precautions, NPO status , Patient's Chart, lab work & pertinent test results  History of Anesthesia Complications (+) PONVNegative for: history of anesthetic complications  Airway Mallampati: II  TM Distance: >3 FB Neck ROM: Full    Dental  (+) Poor Dentition, Dental Advisory Given   Pulmonary asthma , former smoker,    Pulmonary exam normal        Cardiovascular negative cardio ROS Normal cardiovascular exam     Neuro/Psych negative neurological ROS  negative psych ROS   GI/Hepatic negative GI ROS, Neg liver ROS,   Endo/Other  diabetes  Renal/GU negative Renal ROS     Musculoskeletal   Abdominal   Peds  Hematology   Anesthesia Other Findings   Reproductive/Obstetrics                          Anesthesia Physical Anesthesia Plan  ASA: III and emergent  Anesthesia Plan: General   Post-op Pain Management:    Induction: Intravenous  Airway Management Planned: Oral ETT  Additional Equipment:   Intra-op Plan:   Post-operative Plan: Extubation in OR  Informed Consent: I have reviewed the patients History and Physical, chart, labs and discussed the procedure including the risks, benefits and alternatives for the proposed anesthesia with the patient or authorized representative who has indicated his/her understanding and acceptance.   Dental advisory given  Plan Discussed with: CRNA, Anesthesiologist and Surgeon  Anesthesia Plan Comments:       Anesthesia Quick Evaluation

## 2015-10-06 NOTE — Progress Notes (Signed)
Insulin for drip arrived from pharmacy while or transporter here to collect patient . Spoke to or and was instructed to send bag with patient they would hang done there

## 2015-10-06 NOTE — Progress Notes (Signed)
Dr Ezzard StandingNewman aware of cbg=[phone]

## 2015-10-07 ENCOUNTER — Encounter (HOSPITAL_COMMUNITY): Payer: Self-pay | Admitting: Internal Medicine

## 2015-10-07 DIAGNOSIS — D72829 Elevated white blood cell count, unspecified: Secondary | ICD-10-CM | POA: Diagnosis present

## 2015-10-07 DIAGNOSIS — Z794 Long term (current) use of insulin: Secondary | ICD-10-CM

## 2015-10-07 DIAGNOSIS — E119 Type 2 diabetes mellitus without complications: Secondary | ICD-10-CM

## 2015-10-07 LAB — GLUCOSE, CAPILLARY
GLUCOSE-CAPILLARY: 144 mg/dL — AB (ref 65–99)
GLUCOSE-CAPILLARY: 194 mg/dL — AB (ref 65–99)
GLUCOSE-CAPILLARY: 253 mg/dL — AB (ref 65–99)
Glucose-Capillary: 144 mg/dL — ABNORMAL HIGH (ref 65–99)
Glucose-Capillary: 132 mg/dL — ABNORMAL HIGH (ref 65–99)
Glucose-Capillary: 122 mg/dL — ABNORMAL HIGH (ref 65–99)
Glucose-Capillary: 264 mg/dL — ABNORMAL HIGH (ref 65–99)

## 2015-10-07 MED ORDER — INSULIN ASPART 100 UNIT/ML ~~LOC~~ SOLN: 0.0000 [IU] | Freq: Every day | SUBCUTANEOUS | Status: DC

## 2015-10-07 MED ORDER — SULFAMETHOXAZOLE-TRIMETHOPRIM 800-160 MG PO TABS: 1.0000 | ORAL_TABLET | Freq: Two times a day (BID) | ORAL | Status: DC

## 2015-10-07 MED ORDER — OXYCODONE HCL 5 MG PO TABS: 5.0000 mg | ORAL_TABLET | ORAL | Status: DC | PRN

## 2015-10-07 MED ORDER — INSULIN ASPART 100 UNIT/ML ~~LOC~~ SOLN
0.0000 [IU] | Freq: Three times a day (TID) | SUBCUTANEOUS | Status: DC
  Administered 2015-10-07 (×2): 11 [IU] via SUBCUTANEOUS

## 2015-10-07 MED ORDER — INSULIN ASPART PROT & ASPART (70-30 MIX) 100 UNIT/ML ~~LOC~~ SUSP
35.0000 [IU] | Freq: Two times a day (BID) | SUBCUTANEOUS | Status: DC
  Administered 2015-10-07: 35 [IU] via SUBCUTANEOUS
  Filled 2015-10-07: qty 10

## 2015-10-07 NOTE — Discharge Summary (Signed)
Physician Discharge Summary  Shawn Benson OTL:[id number] DOB: 1980/10/17 DOA: 10/05/2015  [facility]: [name]   Admit date: 10/05/2015 Discharge date: 10/07/2015  Recommendations for Outpatient Follow-up:  1. Continue bactrim for 5 days on discharge   Discharge Diagnoses:  Principal Problem:   Abscess of right buttock Active Problems:   Asthma   Morbid obesity due to excess calories (HCC)   Controlled diabetes mellitus without complication, without long-term current use of insulin (HCC)   Leukocytosis   Discharge Condition: stable   Diet recommendation: as tolerated   History of present illness:  34 year old male with past history of morbid obesity, diabetes mellitus type 2, recent admission for DKA who presented to [facility] ED 12/28 with drainage on right buttock. He underwent I&D by surgery with no subsequent complications.   Hospital Course:   Assessment/Plan:  Principal Problem:  Abscess of right buttock / Leukocytosis - S/P I&D by surgery - Will go home with bactrim for 5 days per surgery - Pt was on vanco and zosyn in hospital   Active Problems: Morbid obesity secondary to excess calories - Body mass index is 46.51 kg/(m^2). - Counseled on nutrition     Controlled diabetes mellitus without complication, without long-term current use of insulin (HCC) - Continue insulin regimen as per prior to this admission.   Code Status: Full code Family Communication: No family present     Consultants:  General surgery  Procedures:  Planned I&D of right buttock abscesses on 12/29  Antibiotics:  IV Vancomycin 12/29->12/30  IV Zosyn 12/29->12/30   Signed:  Leisa Lenz, MD  [facility] 10/07/2015, 9:42 AM  Pager #: [phone]  Time spent in minutes: more than 30 minutes    Discharge Exam: Filed Vitals:   10/07/15 0127 10/07/15 0444  BP: 117/55 117/61  Pulse: 73 93  Temp: 98.5 F (36.9 C) 98.6 F (37 C)  Resp: 17 18   Filed  Vitals:   10/06/15 1755 10/06/15 2142 10/07/15 0127 10/07/15 0444  BP: 120/64 131/71 117/55 117/61  Pulse: 74 78 73 93  Temp: 98.2 F (36.8 C) 98.6 F (37 C) 98.5 F (36.9 C) 98.6 F (37 C)  TempSrc: Oral Oral Oral Oral  Resp: '18 20 17 18  ' Height:      Weight:      SpO2: 100% 100% 100% 100%    General: Pt is alert, follows commands appropriately, not in acute distress Cardiovascular: Regular rate and rhythm, S1/S2 +, no murmurs Respiratory: Clear to auscultation bilaterally, no wheezing, no crackles, no rhonchi Abdominal: Soft, non tender, non distended, bowel sounds +, no guarding Extremities: no edema, no cyanosis, pulses palpable bilaterally DP and PT Neuro: Grossly nonfocal  Discharge Instructions  Discharge Instructions    Call MD for:  difficulty breathing, headache or visual disturbances    Complete by:  As directed      Call MD for:  persistant dizziness or light-headedness    Complete by:  As directed      Call MD for:  persistant nausea and vomiting    Complete by:  As directed      Call MD for:  severe uncontrolled pain    Complete by:  As directed      Diet - low sodium heart healthy    Complete by:  As directed      Increase activity slowly    Complete by:  As directed             Medication List  TAKE these medications        freestyle lancets  Use as instructed     glucose blood test strip  Use as instructed     glucose monitoring kit monitoring kit  1 each by Does not apply route as needed for other.     insulin aspart protamine- aspart (70-30) 100 UNIT/ML injection  Commonly known as:  NOVOLOG MIX 70/30  Inject 0.35 mLs (35 Units total) into the skin 2 (two) times daily with a meal.     INSULIN SYRINGE .5CC/31GX5/16" 31G X 5/16" 0.5 ML Misc  1 Device by Does not apply route 2 (two) times daily with a meal.     metFORMIN 500 MG tablet  Commonly known as:  GLUCOPHAGE  Take 1 tablet (500 mg total) by mouth 2 (two) times daily with a  meal.     sulfamethoxazole-trimethoprim 800-160 MG tablet  Commonly known as:  BACTRIM DS,SEPTRA DS  Take 1 tablet by mouth every 12 (twelve) hours.           Follow-up Information    [facility]. Go on 10/19/2015.   Why:  Appointment time 10am   Contact information:   [address] [place] [address]  Appointment time 10am      Follow up with Raylene Miyamoto, PA-C On 10/19/2015.   Specialty:  Surgery   Why:  [facility] Surgery, 9:30am, arrive no later than 9:00am for paperwork and check in   Contact information:   [place] [address] [phone]        The results of significant diagnostics from this hospitalization (including imaging, microbiology, ancillary and laboratory) are listed below for reference.    Significant Diagnostic Studies: Ct Abdomen Pelvis W Contrast  10/05/2015  CLINICAL DATA:  34 year old male with right gluteal abscess. EXAM: CT ABDOMEN AND PELVIS WITH CONTRAST TECHNIQUE: Multidetector CT imaging of the abdomen and pelvis was performed using the standard protocol following bolus administration of intravenous contrast. CONTRAST:  148m OMNIPAQUE IOHEXOL 300 MG/ML  SOLN COMPARISON:  None. FINDINGS: Visualized lung bases are clear. No intra-abdominal free air or free fluid. Diffuse fatty infiltration of the liver. The gallbladder, pancreas, spleen, adrenal glands, kidneys, visualized ureters, and urinary bladder appear unremarkable. The prostate and seminal vesicles are grossly unremarkable. There is moderate stool throughout the colon. No evidence of bowel obstruction or inflammation. Normal appendix. The abdominal aorta and IVC appear unremarkable on this noncontrast study. No portal venous gas identified. There is no adenopathy. There is diffuse inflammatory changes and stranding of the subcutaneous soft tissues of the right gluteal region. There are two more localized fluid attenuating foci measuring  2.3 x 2.7 cm and 3.0 x 2.3 cm approximately 1.5 cm deep into the skin of the fissure of the buttocks concerning for phlegmon or developing abscess. The more superficial collection appears to extend to the posterior skin surface of the right buttock. Ultrasound may provide better evaluation. The osseous structures are unremarkable. IMPRESSION: Inflammatory changes of the subcutaneous soft tissues of the right buttock with multiple small fluid collections in the superficial subcutaneous soft tissues approximately 1.0-1.5 cm deep to the skin. There is apparent extension of the superficial collection to the skin surface. Correlation with clinical exam is recommended. Ultrasound may provide additional evaluation. No acute intra-abdominal pelvic pathology. Electronically Signed   By: AAnner CreteM.D.   On: 10/05/2015 18:37    Microbiology: Recent Results (from the past 240  hour(s))  Surgical pcr screen     Status: Abnormal   Collection Time: 10/06/15  9:35 AM  Result Value Ref Range Status   MRSA, PCR NEGATIVE NEGATIVE Final   Staphylococcus aureus POSITIVE (A) NEGATIVE Final    Comment:        The Xpert SA Assay (FDA approved for NASAL specimens in patients over 22 years of age), is one component of a comprehensive surveillance program.  Test performance has been validated by [facility] for patients greater than or equal to 90 year old. It is not intended to diagnose infection nor to guide or monitor treatment.   Anaerobic culture     Status: None (Preliminary result)   Collection Time: 10/06/15 12:59 PM  Result Value Ref Range Status   Specimen Description ABSCESS RIGHT BUTTOCKS  Final   Special Requests NONE  Final   Gram Stain   Final    MODERATE WBC PRESENT, PREDOMINANTLY PMN NO SQUAMOUS EPITHELIAL CELLS SEEN MODERATE GRAM POSITIVE COCCI IN PAIRS Performed at [facility]    Culture PENDING  Incomplete   Report Status PENDING  Incomplete  Culture, routine-abscess      Status: None (Preliminary result)   Collection Time: 10/06/15 12:59 PM  Result Value Ref Range Status   Specimen Description ABSCESS RIGHT BUTTOCKS  Final   Special Requests NONE  Final   Gram Stain   Final    MODERATE WBC PRESENT, PREDOMINANTLY PMN NO SQUAMOUS EPITHELIAL CELLS SEEN FEW GRAM POSITIVE COCCI IN PAIRS Performed at [facility]    Culture PENDING  Incomplete   Report Status PENDING  Incomplete     Labs: Basic Metabolic Panel:  Recent Labs Lab 10/01/15 0424 10/02/15 0415 10/03/15 0420 10/05/15 1702 10/06/15 0538  NA 139 138 137 134* 138  K 3.3* 4.2 4.2 3.7 3.7  CL 105 103 103 101 102  CO2 '25 26 27 24 27  ' GLUCOSE 205* 333* 328* 328* 300*  BUN '12 10 11 8 9  ' CREATININE 0.86 0.90 0.99 0.80 0.92  CALCIUM 9.0 8.7* 8.9 8.7* 8.4*  MG  --   --   --  1.6* 1.7  PHOS  --   --   --   --  3.8   Liver Function Tests:  Recent Labs Lab 09/30/15 1258 10/01/15 0424 10/06/15 0538  AST '16 16 16  ' ALT 23 20 35  ALKPHOS 114 90 67  BILITOT 1.2 1.0 0.9  PROT 8.0 7.3 6.3*  ALBUMIN 4.1 3.6 2.9*   No results for input(s): LIPASE, AMYLASE in the last 168 hours. No results for input(s): AMMONIA in the last 168 hours. CBC:  Recent Labs Lab 09/30/15 1255 10/01/15 0424 10/05/15 1702 10/06/15 0538  WBC 12.8* 12.8* 13.5* 13.6*  NEUTROABS  --   --  10.3* 9.7*  HGB 13.3 12.8* 12.7* 10.8*  HCT 41.1 39.3 38.3* 33.7*  MCV 79.3 78.6 79.8 79.7  PLT 290 271 282 261   Cardiac Enzymes: No results for input(s): CKTOTAL, CKMB, CKMBINDEX, TROPONINI in the last 168 hours. BNP: BNP (last 3 results) No results for input(s): BNP in the last 8760 hours.  ProBNP (last 3 results) No results for input(s): PROBNP in the last 8760 hours.  CBG:  Recent Labs Lab 10/07/15 0124 10/07/15 0227 10/07/15 0349 10/07/15 0440 10/07/15 0805  GLUCAP 144* 144* 132* 122* 253*

## 2015-10-07 NOTE — Progress Notes (Signed)
Patient ID: Shawn Benson, male   DOB: [birth year], 34 y.o.   MRN: [id number] 1 Day Post-Op  Subjective: Pt doing well.  Still with some pain, but improving  Objective: Vital signs in last 24 hours: Temp:  [97.4 F (36.3 C)-98.6 F (37 C)] 98.6 F (37 C) (12/30 0444) Pulse Rate:  [69-97] 93 (12/30 0444) Resp:  [15-28] 18 (12/30 0444) BP: (108-145)/(55-83) 117/61 mmHg (12/30 0444) SpO2:  [95 %-100 %] 100 % (12/30 0444) Last BM Date: 10/04/15  Intake/Output from previous day: 12/29 0701 - 12/30 0700 In: 1951.7 [I.V.:1451.7; IV Piggyback:500] Out: 2595 [Urine:2575; Blood:20] Intake/Output this shift:    PE: Skin: buttock wound is clean.  Packing removed and no purulent drainage was seen.  No induration or erythema noted  Lab Results:   Recent Labs  10/05/15 1702 10/06/15 0538  WBC 13.5* 13.6*  HGB 12.7* 10.8*  HCT 38.3* 33.7*  PLT 282 261   BMET  Recent Labs  10/05/15 1702 10/06/15 0538  NA 134* 138  K 3.7 3.7  CL 101 102  CO2 24 27  GLUCOSE 328* 300*  BUN 8 9  CREATININE 0.80 0.92  CALCIUM 8.7* 8.4*   PT/INR  Recent Labs  10/06/15 0538  LABPROT 15.4*  INR 1.20   CMP     Component Value Date/Time   NA 138 10/06/2015 0538   K 3.7 10/06/2015 0538   CL 102 10/06/2015 0538   CO2 27 10/06/2015 0538   GLUCOSE 300* 10/06/2015 0538   BUN 9 10/06/2015 0538   CREATININE 0.92 10/06/2015 0538   CALCIUM 8.4* 10/06/2015 0538   PROT 6.3* 10/06/2015 0538   ALBUMIN 2.9* 10/06/2015 0538   AST 16 10/06/2015 0538   ALT 35 10/06/2015 0538   ALKPHOS 67 10/06/2015 0538   BILITOT 0.9 10/06/2015 0538   GFRNONAA >60 10/06/2015 0538   GFRAA >60 10/06/2015 0538   Lipase  No results found for: LIPASE     Studies/Results: Ct Abdomen Pelvis W Contrast  10/05/2015  CLINICAL DATA:  34 year old male with right gluteal abscess. EXAM: CT ABDOMEN AND PELVIS WITH CONTRAST TECHNIQUE: Multidetector CT imaging of the abdomen and pelvis was performed using the standard  protocol following bolus administration of intravenous contrast. CONTRAST:  OMNIPAQUE IOHEXOL 300 MG/ML  SOLN COMPARISON:  None. FINDINGS: Visualized lung bases are clear. No intra-abdominal free air or free fluid. Diffuse fatty infiltration of the liver. The gallbladder, pancreas, spleen, adrenal glands, kidneys, visualized ureters, and urinary bladder appear unremarkable. The prostate and seminal vesicles are grossly unremarkable. There is moderate stool throughout the colon. No evidence of bowel obstruction or inflammation. Normal appendix. The abdominal aorta and IVC appear unremarkable on this noncontrast study. No portal venous gas identified. There is no adenopathy. There is diffuse inflammatory changes and stranding of the subcutaneous soft tissues of the right gluteal region. There are two more localized fluid attenuating foci measuring 2.3 x 2.7 cm and 3.0 x 2.3 cm approximately 1.5 cm deep into the skin of the fissure of the buttocks concerning for phlegmon or developing abscess. The more superficial collection appears to extend to the posterior skin surface of the right buttock. Ultrasound may provide better evaluation. The osseous structures are unremarkable. IMPRESSION: Inflammatory changes of the subcutaneous soft tissues of the right buttock with multiple small fluid collections in the superficial subcutaneous soft tissues approximately 1.0-1.5 cm deep to the skin. There is apparent extension of the superficial collection to the skin surface. Correlation with clinical exam is  recommended. Ultrasound may provide additional evaluation. No acute intra-abdominal pelvic pathology. Electronically Signed   By: Elgie CollardArash  Radparvar M.D.   On: 10/05/2015 18:37    Anti-infectives: Anti-infectives    Start     Dose/Rate Route Frequency Ordered Stop   10/06/15 1600  piperacillin-tazobactam (ZOSYN) IVPB 3.375 g     3.375 g 12.5 mL/hr over 240 Minutes Intravenous 3 times per day 10/06/15 1555      10/06/15 1400  vancomycin (VANCOCIN) 1,500 mg in sodium chloride 0.9 % 500 mL IVPB     1,500 mg 250 mL/hr over 120 Minutes Intravenous Every 8 hours 10/06/15 0254     10/06/15 0600  piperacillin-tazobactam (ZOSYN) IVPB 3.375 g  Status:  Discontinued     3.375 g 12.5 mL/hr over 240 Minutes Intravenous Every 8 hours 10/06/15 0137 10/06/15 1555   10/06/15 0145  vancomycin (VANCOCIN) 2,500 mg in sodium chloride 0.9 % 500 mL IVPB     2,500 mg 250 mL/hr over 120 Minutes Intravenous  Once 10/06/15 0130 10/06/15 0404   10/05/15 2130  piperacillin-tazobactam (ZOSYN) IVPB 3.375 g     3.375 g 12.5 mL/hr over 240 Minutes Intravenous  Once 10/05/15 2119 10/06/15 0210       Assessment/Plan  1. POD 1, s/p I&D of right buttock abscess -packing removed.  No further packing needs to be replaced.  Will just do dry dressings over wound and do sitz bathes QID -5 more days of abx therapy, CX prelim gram + cocci, so Bactrim or doxy at home -patient is surgically stable for DC home today from our standpoint, when medically stable -follow up has been arranged in our office in 2 weeks for a wound check.   LOS: 1 day    OSBORNE,KELLY E 10/07/2015, 8:20 AM Pager: [phone]  Agree with above.  Ovidio Kinavid Zuhayr Deeney, MD, [facility] Pager: [phone] Office phone:  [phone]

## 2015-10-07 NOTE — Progress Notes (Signed)
Patient tolerating carb mod diet well, showered this am and cleaned wound as instructed by PA.  Patient's pain controlled with oral pain medications.  Diabetic education provided to patient.  Discharge instructions given to patient.  Patient verbalizes understanding.  Patient to be discharged home to self care with spouse.  Prescriptions given to patient.

## 2015-10-07 NOTE — Discharge Instructions (Signed)
Disposable Sitz Bath A disposable sitz bath is a plastic basin that fits over the toilet. A bag is hung above the toilet and is connected to a tube that opens into the disposable sitz bath. The bag is filled with warm water that can flow into the basin through the tube.  HOW TO USE A DISPOSABLE SITZ BATH 1. Close the clamp on the tubing before filling the bag with water. This is to prevent leakage. 2. Fill the sitz bath basin and the plastic bag with warm water. 3. Place the filled basin on the toilet with the seat raised. Make sure the overflow opening is facing toward the back of the toilet. 4. Hang the filled plastic bag overhead on a hook or towel rack close to the toilet. When the bag is unclamped, a steady stream of water will flow from the bag, through the tubing, and into the basin. 5. Attach the tubing to the opening on the basin. 6. Sit on the basin positioned on the toilet seat and release the clamp. This will allow warm water to flush the area around your genitals and anus (perineum). 7. Remain sitting on the basin for approximately 15 to 20 minutes. 8. Stand up and pat the perineum area dry. If needed, apply clean bandages (dressings) to the affected area. 9. Tip the basin into the toilet to remove any remaining water and flush the toilet. 10. Wash the basin with warm water and soap. Let it dry in the sink. 11. Store the basin and tubing in a clean, dry area. 12. Wash your hands with soap and water. SEEK MEDICAL CARE IF: You get worse instead of better. Stop the sitz baths if you get worse. MAKE SURE YOU:  Understand these instructions.  Will watch your condition.  Will get help right away if you are not doing well or get worse.   This information is not intended to replace advice given to you by your health care provider. Make sure you discuss any questions you have with your health care provider.   Document Released: 03/25/2012 Document Revised: 06/18/2012 Document Reviewed:  03/25/2012 Elsevier Interactive Patient Education [employer]2016 [employer] Inc.  If you can't get in the tub, shower 2-3 times a day and especially after a BM to clean this wound.  Dry gauze or pad in your underwear to help with drainage

## 2015-10-07 NOTE — Care Management Note (Signed)
Case Management Note  Patient Details  Name: Shawn Benson MRN: [id number] Date of Birth: 02/14/1981  Subjective/Objective:   Admitted with buttock abscess r/t DM                 Action/Plan: Discharge planning, seen by ED CM, has f/u arranged, able to afford meds/supplies  Expected Discharge Date:   (unknown)               Expected Discharge Plan:  Home/Self Care  In-House Referral:  Nutrition, PCP / [facility]  Discharge planning Services  CM Consult  Post Acute Care Choice:  NA Choice offered to:  NA  DME Arranged:  N/A DME Agency:  NA  HH Arranged:  NA HH Agency:  NA  Status of Service:  Completed, signed off  Medicare Important Message Given:    Date Medicare IM Given:    Medicare IM give by:    Date Additional Medicare IM Given:    Additional Medicare Important Message give by:     If discussed at Long Length of Stay Meetings, dates discussed:    Additional Comments:  Alexis Goodelleele, Shakeem Stern K, RN 10/07/2015, 1:32 PM

## 2015-10-09 LAB — CULTURE, ROUTINE-ABSCESS

## 2015-10-11 LAB — ANAEROBIC CULTURE

## 2015-10-19 ENCOUNTER — Ambulatory Visit (INDEPENDENT_AMBULATORY_CARE_PROVIDER_SITE_OTHER): Payer: Self-pay | Admitting: Internal Medicine

## 2015-10-19 ENCOUNTER — Ambulatory Visit: Payer: Self-pay | Admitting: Internal Medicine

## 2015-10-19 ENCOUNTER — Encounter: Payer: Self-pay | Admitting: Internal Medicine

## 2015-10-19 VITALS — BP 118/82 | HR 76 | Ht 77.0 in | Wt >= 6400 oz

## 2015-10-19 DIAGNOSIS — IMO0001 Reserved for inherently not codable concepts without codable children: Secondary | ICD-10-CM

## 2015-10-19 DIAGNOSIS — E1065 Type 1 diabetes mellitus with hyperglycemia: Principal | ICD-10-CM

## 2015-10-19 DIAGNOSIS — E109 Type 1 diabetes mellitus without complications: Secondary | ICD-10-CM

## 2015-10-19 MED ORDER — INSULIN ASPART PROT & ASPART (70-30 MIX) 100 UNIT/ML ~~LOC~~ SUSP: 35.0000 [IU] | Freq: Two times a day (BID) | SUBCUTANEOUS | Status: DC

## 2015-10-19 MED ORDER — METFORMIN HCL 500 MG PO TABS: 500.0000 mg | ORAL_TABLET | Freq: Two times a day (BID) | ORAL | Status: DC

## 2015-10-19 NOTE — Patient Instructions (Signed)
Drink a glass of water before every meal Drink 6-8 glasses of water daily Eat three meals daily Eat a protein and healthy fat with every meal (eggs,fish, chicken, turkey and limit red meats) Eat 5 servings of vegetables daily, mix the colors Eat 2 servings of fruit daily with skin, if skin is edible Use smaller plates Put food/utensils down as you chew and swallow each bite Eat at a table with friends/family at least once daily, no TV Do not eat in front of the TV 

## 2015-10-19 NOTE — Progress Notes (Signed)
   Subjective:    Patient ID: Shawn Benson, male    DOB: 05/07/1981, 35 y.o.   MRN: [id number]  HPI  1. Newly diagnosed DM, felt to be Type II, but was 800 + on ED visit 12/23.  I do not see a venous bicarbonate less than 21. A1C was measured at 10.5% Was given option of oral meds alone or oral plus insulin at that ED visit and elected oral/insulin combination as was concerned about follow up at the time. Abscess of his buttock requiring OR I and D and hospitalization 10/05/2015.   Appears sugars running in low 300s during that stay.  I do not see a lipid panel from either visit. Only eats twice daily.  Often does not awaken until noon.  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   No history of hypertension.  Meds:   Novolog Mix 25/75   35 units injected SQ twice daily  Metformin 500 mg twice daily by mouth Finished Bactrim DS for buttock abscess  Allergies: NKDA       Review of Systems     Objective:   Physical Exam Morbidly obese HEENT: PERRL, EOMI, discs sharp without exudate or hemorrhage.  Throat without injection, TMs pearly gray Neck:  Supple, No adenopathy, no thyromegaly Chest;  CTA CV:  RRR with normal S1 and S2, No S3, S4, or murmur.  Radial and DP pulses normal and equal. Abd:  S, NT, No HSM or masses, +BS throughout. Extrems: obese.  No edema.        Assessment & Plan:  1.  DM II:  Currently Insulin requiring.  LOng discussion regarding diet and physical activity for weight loss and better control.   Recommend influenza vaccine, but not available here today. Also recommend Pneumovax at PHD Tdap up to date Daily foot check Will need orange card for eye referral.

## 2015-11-04 ENCOUNTER — Telehealth: Payer: Self-pay | Admitting: Internal Medicine

## 2015-11-04 DIAGNOSIS — E1065 Type 1 diabetes mellitus with hyperglycemia: Principal | ICD-10-CM

## 2015-11-04 DIAGNOSIS — IMO0001 Reserved for inherently not codable concepts without codable children: Secondary | ICD-10-CM

## 2015-11-04 DIAGNOSIS — E1165 Type 2 diabetes mellitus with hyperglycemia: Secondary | ICD-10-CM

## 2015-11-04 NOTE — Telephone Encounter (Signed)
Patient called requesting refill for Metformin 500 mg tablet. Patient states he has 8 pills left. Please send to [facility] pharmacy on [address]. ([address]

## 2015-11-07 MED ORDER — METFORMIN HCL 500 MG PO TABS: 500.0000 mg | ORAL_TABLET | Freq: Two times a day (BID) | ORAL | Status: DC

## 2015-11-07 NOTE — Telephone Encounter (Signed)
Patient aware.

## 2015-11-22 ENCOUNTER — Ambulatory Visit: Payer: Self-pay | Admitting: Internal Medicine

## 2015-12-02 ENCOUNTER — Ambulatory Visit: Payer: Self-pay | Admitting: Internal Medicine

## 2015-12-06 ENCOUNTER — Encounter: Payer: Self-pay | Admitting: Internal Medicine

## 2015-12-06 ENCOUNTER — Ambulatory Visit (INDEPENDENT_AMBULATORY_CARE_PROVIDER_SITE_OTHER): Payer: Self-pay | Admitting: Internal Medicine

## 2015-12-06 VITALS — BP 130/80 | HR 72 | Ht 77.0 in | Wt >= 6400 oz

## 2015-12-06 DIAGNOSIS — E119 Type 2 diabetes mellitus without complications: Secondary | ICD-10-CM

## 2015-12-06 DIAGNOSIS — F17219 Nicotine dependence, cigarettes, with unspecified nicotine-induced disorders: Secondary | ICD-10-CM

## 2015-12-06 DIAGNOSIS — R609 Edema, unspecified: Secondary | ICD-10-CM

## 2015-12-06 DIAGNOSIS — B353 Tinea pedis: Secondary | ICD-10-CM

## 2015-12-06 DIAGNOSIS — B351 Tinea unguium: Secondary | ICD-10-CM | POA: Insufficient documentation

## 2015-12-06 DIAGNOSIS — Z23 Encounter for immunization: Secondary | ICD-10-CM

## 2015-12-06 LAB — GLUCOSE, POCT (MANUAL RESULT ENTRY): POC GLUCOSE: 108 mg/dL — AB (ref 70–99)

## 2015-12-06 MED ORDER — TERBINAFINE HCL 250 MG PO TABS: 250.0000 mg | ORAL_TABLET | Freq: Every day | ORAL | Status: DC

## 2015-12-06 MED ORDER — FUROSEMIDE 20 MG PO TABS: 20.0000 mg | ORAL_TABLET | Freq: Every day | ORAL | Status: DC

## 2015-12-06 NOTE — Progress Notes (Signed)
   Subjective:    Patient ID: Shawn Benson, male    DOB: 08/16/1981, 35 y.o.   MRN: [id number]  HPI  1.  DM II, Insulin Requiring:  Sometimes missing both insulin and Metformin dosing. Sometimes, doesn't feel like he needs it.  May miss 4-5 times during week. Sugars he checked are 177 or below.   Is not eating snacks or lunch still. Weight at Surgeon's office in January was 408 lbs.   Walks daily 1 mile. States in 2011, when released from prison weighed 265 lbs.  Went into prison at 423 lbs.  This was over a year's time.   Does not have orange card. Does have a Y near him at [place].  2.  Tobacco Abuse:  Pt. Back to smoking--chain smokes when stressed during gambling.  Meds: Novolog 70/30 mix:  35 units twice daily Metformin 500 mg twice daily  Allergies:  NKDA  Tobacco:  Back to 1ppd  Alcohol:  Occasional  Drugs:  MJ     Review of Systems     Objective:   Physical Exam  Morbidly obese HEENT:  PERRL, EOMI, TMs pearly gray, throat without injection.  +dental decay with some missing teeth Neck: Supple, No adenopathy or thyromegaly Chest:  CTA CV:  RRR with normal S1 and S2, No S3, S4, or murmur.  No carotid bruits, Carotid, radial and DP pulses normal and equal Abd:  S, NT, No HSM or mass, +BS  LE:  Bilateral mild to moderate pitting edema to pretibial area with flaking of skin in same area.  Feet  With thickened flaking of plantar area feet diffusely.  Toenails with thickened, crumbling discoloration, especially great toenails     Assessment & Plan:  1.  DM II, insulin requiring:  No change to therapy for now.  Pt. Not consistent with current treatment, though his recorded sugars are fairly good. Discussed complications with time with poorly controlled DM Encouraged him to get his orange card so we can send him for eye exam, dental exam, obtain lower cost insulin and test strips. Nightly foot exams and care CMP and FLP, Influenza vaccine today  2.  Morbid  obesity:  Long discussion regarding lifestyle changes for diet and physical activity.  3.  Tobacco Abuse and other addictions (gambling)  Referral to Nilda Simmer, LCSW for behavioral modification with this.  4.  LE edema:  Furosemide 20 mg in the morning daily.  Eat a banana daily  5.  Tinea pedis and toenail onychomycosis:  Terbinafine 250 mg for 90 day treatment.  Follow up in 6 weeks.

## 2015-12-06 NOTE — Patient Instructions (Addendum)
Drink a glass of water before every meal Drink 6-8 glasses of water daily Eat three meals daily Eat a protein and healthy fat with every meal (eggs,fish, chicken, [place] and limit red meats) Eat 5 servings of vegetables daily, mix the colors Eat 2 servings of fruit daily with skin, if skin is edible Use smaller plates Put food/utensils down as you chew and swallow each bite Eat at a table with friends/family at least once daily, no TV Do not eat in front of the TV  Goal should be to lose 1 lb per week.  Pick 1 thing to start each week and try to stick with all of it over time.  Get a scholarship application for the [facility] and get started on physical activity  For foot and toenail fungus:  Spray your shoes with Lysol or Lotrimin Antifungal spray when you start the medication for your feet.   Re-spray your the shoes you wear with each wearing and allow to dry before wearing again Clean your shower floor once to twice daily with bleach containing cleaner.  Wash, apply tea tree oil cream to feet (not between toes) at night, inspect feet nightly as well--use a mirror if you cannot see.

## 2015-12-07 LAB — LIPID PANEL W/O CHOL/HDL RATIO
Cholesterol, Total: 136 mg/dL (ref 100–199)
HDL: 35 mg/dL — ABNORMAL LOW (ref >39)
LDL CALC: 89 mg/dL (ref 0–99)
TRIGLYCERIDES: 60 mg/dL (ref 0–149)
VLDL Cholesterol Cal: 12 mg/dL (ref 5–40)

## 2015-12-07 LAB — COMPREHENSIVE METABOLIC PANEL
ALBUMIN: 4.2 g/dL (ref 3.5–5.5)
ALT: 12 IU/L (ref 0–44)
AST: 14 IU/L (ref 0–40)
Albumin/Globulin Ratio: 1.3 (ref 1.1–2.5)
Alkaline Phosphatase: 83 IU/L (ref 39–117)
BUN / CREAT RATIO: 17 (ref 8–19)
BUN: 14 mg/dL (ref 6–20)
Bilirubin Total: 0.2 mg/dL (ref 0.0–1.2)
CALCIUM: 9.4 mg/dL (ref 8.7–10.2)
CO2: 21 mmol/L (ref 18–29)
CREATININE: 0.84 mg/dL (ref 0.76–1.27)
Chloride: 102 mmol/L (ref 96–106)
GFR, EST AFRICAN AMERICAN: 130 mL/min/{1.73_m2} (ref >59)
GFR, EST NON AFRICAN AMERICAN: 113 mL/min/{1.73_m2} (ref >59)
GLOBULIN, TOTAL: 3.2 g/dL (ref 1.5–4.5)
Glucose: 92 mg/dL (ref 65–99)
Potassium: 4.3 mmol/L (ref 3.5–5.2)
SODIUM: 143 mmol/L (ref 134–144)
TOTAL PROTEIN: 7.4 g/dL (ref 6.0–8.5)

## 2015-12-27 ENCOUNTER — Other Ambulatory Visit: Payer: Self-pay | Admitting: Licensed Clinical Social Worker

## 2016-01-17 ENCOUNTER — Ambulatory Visit: Payer: Self-pay | Admitting: Internal Medicine

## 2016-03-09 ENCOUNTER — Telehealth: Payer: Self-pay

## 2016-03-09 NOTE — Telephone Encounter (Signed)
Please call the patient back to schedule an appointment for follow-up. Thank you.

## 2016-03-09 NOTE — Telephone Encounter (Signed)
Follow up appointment scheduled for 03/30/16 at 10:30 am

## 2016-03-19 ENCOUNTER — Telehealth: Payer: Self-pay | Admitting: Internal Medicine

## 2016-03-19 DIAGNOSIS — E119 Type 2 diabetes mellitus without complications: Secondary | ICD-10-CM

## 2016-03-19 DIAGNOSIS — Z794 Long term (current) use of insulin: Principal | ICD-10-CM

## 2016-03-19 MED ORDER — INSULIN NPH ISOPHANE & REGULAR (70-30) 100 UNIT/ML ~~LOC~~ SUSP: 35.0000 [IU] | Freq: Two times a day (BID) | SUBCUTANEOUS | Status: DC

## 2016-03-19 MED ORDER — AGAMATRIX PRESTO W/DEVICE KIT: 1.0000 | PACK | Freq: Two times a day (BID) | Status: DC

## 2016-03-19 MED ORDER — GLUCOSE BLOOD VI STRP: ORAL_STRIP | Status: DC

## 2016-03-20 ENCOUNTER — Other Ambulatory Visit: Payer: Self-pay

## 2016-03-20 DIAGNOSIS — E119 Type 2 diabetes mellitus without complications: Secondary | ICD-10-CM

## 2016-03-20 MED ORDER — INSULIN NPH ISOPHANE & REGULAR (70-30) 100 UNIT/ML ~~LOC~~ SUSP: 35.0000 [IU] | Freq: Two times a day (BID) | SUBCUTANEOUS | Status: DC

## 2016-03-20 NOTE — Telephone Encounter (Signed)
Medication switched to Humulin 70/30 on health dept formulary. rx sent

## 2016-03-20 NOTE — Telephone Encounter (Signed)
Handled in another in another telephone encounter.

## 2016-03-22 NOTE — Telephone Encounter (Signed)
Patient informed. 

## 2016-03-30 ENCOUNTER — Encounter: Payer: Self-pay | Admitting: Internal Medicine

## 2016-03-30 ENCOUNTER — Ambulatory Visit (INDEPENDENT_AMBULATORY_CARE_PROVIDER_SITE_OTHER): Payer: Self-pay | Admitting: Internal Medicine

## 2016-03-30 VITALS — BP 140/78 | HR 90 | Resp 18 | Ht 77.0 in | Wt >= 6400 oz

## 2016-03-30 DIAGNOSIS — E119 Type 2 diabetes mellitus without complications: Secondary | ICD-10-CM

## 2016-03-30 DIAGNOSIS — Z23 Encounter for immunization: Secondary | ICD-10-CM

## 2016-03-30 DIAGNOSIS — Z91199 Patient's noncompliance with other medical treatment and regimen due to unspecified reason: Secondary | ICD-10-CM

## 2016-03-30 DIAGNOSIS — E1165 Type 2 diabetes mellitus with hyperglycemia: Secondary | ICD-10-CM

## 2016-03-30 DIAGNOSIS — L0292 Furuncle, unspecified: Secondary | ICD-10-CM

## 2016-03-30 DIAGNOSIS — R609 Edema, unspecified: Secondary | ICD-10-CM

## 2016-03-30 DIAGNOSIS — Z9119 Patient's noncompliance with other medical treatment and regimen: Secondary | ICD-10-CM

## 2016-03-30 DIAGNOSIS — Z794 Long term (current) use of insulin: Secondary | ICD-10-CM

## 2016-03-30 DIAGNOSIS — E11628 Type 2 diabetes mellitus with other skin complications: Secondary | ICD-10-CM

## 2016-03-30 DIAGNOSIS — IMO0002 Reserved for concepts with insufficient information to code with codable children: Secondary | ICD-10-CM

## 2016-03-30 LAB — GLUCOSE, POCT (MANUAL RESULT ENTRY): POC Glucose: 329 mg/dl — AB (ref 70–99)

## 2016-03-30 MED ORDER — INSULIN NPH ISOPHANE & REGULAR (70-30) 100 UNIT/ML ~~LOC~~ SUSP: 35.0000 [IU] | Freq: Two times a day (BID) | SUBCUTANEOUS | Status: DC

## 2016-03-30 MED ORDER — AGAMATRIX PRESTO W/DEVICE KIT: 1.0000 | PACK | Freq: Two times a day (BID) | Status: DC

## 2016-03-30 MED ORDER — AMOXICILLIN-POT CLAVULANATE 875-125 MG PO TABS: 1.0000 | ORAL_TABLET | Freq: Two times a day (BID) | ORAL | Status: DC

## 2016-03-30 MED ORDER — GLUCOSE BLOOD VI STRP
ORAL_STRIP | Status: DC
Start: 2016-03-30 — End: 2020-09-16

## 2016-03-30 NOTE — Progress Notes (Signed)
Subjective:    Patient ID: Shawn Benson, male    DOB: 01-17-1981, 35 y.o.   MRN: [id number]  HPI   1.  DM, insulin requiring:  Was in denial regarding need to treat DM as he was feeling better with more normal sugar levels, so stopped taking meds, including insulin.  Sugars have been ranging 400 +.  Called in one evening 2 weeks ago and realized he needed to be back on meds.  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   Fasting this morning and has not taken any morning meds.  Glucose is 329. Metformin 500 mg twice daily Novolin 70/30 35 units twice daily is written dose, but only utilizing 17.5 units.  Sugars have been running 300-400  2.  Boils scattered on body:  One on butt with blood today.  No fevers.  Coming and going since December.   Current outpatient prescriptions:  .  Blood Glucose Monitoring Suppl (AGAMATRIX PRESTO) w/Device KIT, 1 Device by Does not apply route 2 (two) times daily., Disp: 1 kit, Rfl: 0 .  glucose blood (AGAMATRIX PRESTO TEST) test strip, Use as instructed, Disp: 100 each, Rfl: 11 .  glucose blood test strip, Use as instructed, Disp: 100 each, Rfl: 0 .  insulin NPH-regular Human (NOVOLIN 70/30) (70-30) 100 UNIT/ML injection, Inject 35 Units into the skin 2 (two) times daily with a meal. Humulin 70/30, Disp: 10 mL, Rfl: 11 .  Insulin Syringe-Needle U-100 (INSULIN SYRINGE .5CC/31GX5/16") 31G X 5/16" 0.5 ML MISC, 1 Device by Does not apply route 2 (two) times daily with a meal., Disp: 100 each, Rfl: 0 .  Lancets (FREESTYLE) lancets, Use as instructed, Disp: 100 each, Rfl: 0 .  metFORMIN (GLUCOPHAGE) 500 MG tablet, Take 1 tablet (500 mg total) by mouth 2 (two) times daily with a meal., Disp: 60 tablet, Rfl: 11 .  furosemide (LASIX) 20 MG tablet, Take 1 tablet (20 mg total) by mouth daily. (Patient not taking: Reported on 03/30/2016), Disp: 30 tablet, Rfl: 11 .  glucose monitoring kit (FREESTYLE) monitoring kit, 1 each by Does not  apply route as needed for other. (Patient not taking: Reported on 03/30/2016), Disp: 1 each, Rfl: 0 .  terbinafine (LAMISIL) 250 MG tablet, Take 1 tablet (250 mg total) by mouth daily. (Patient not taking: Reported on 03/30/2016), Disp: 90 tablet, Rfl: 0   No Known Allergies         Review of Systems     Objective:   Physical Exam Morbidly obese, falls asleep intermittently Lungs:  CTA CV:  RRR without murmur or rub, radial pulses normal and equal Abd:  S, NT, No HSM or mass, + BS.  Furuncles with some pointing scattered in skin fold of abdominal pannus.  Mild erythema. Many old healed furuncles on buttocks.  One that appears recently drained in buttock cleft to left       Assessment & Plan:  1.   DM, Type 2, insulin requiring:  Long discussion first about having a daily schedule for sleep and wake, followed by schedule for taking meds and eating. Will ask Macie Burows, LCSW to get involved with supporting him to work on this and to see if barriers to his caring adequately for himself Restart Novolin at 35 units twice daily as well as Metformin. Pneumovax today  2.  Peripheral edema:  Restart Furosemide in the morning.  BMP  3.  Furunculosis:  Augmentin for 7days. Follow  up in 2 weeks.  Address Toenail onychomycosis once Shawn gets some stability/schedule in his life and gets DM under better control.

## 2016-03-30 NOTE — Patient Instructions (Signed)
Bedtime:  11:00 p.m. Wake Time:  7:00 a.m. Read in another room after brushing teeth and getting ready for bed at 10:30 p.m.--when clock hits 11:00--get to bed. If unable to fall asleep after 20 minutes in bed, go back to other room and read.  Nexts steps:  Start scheduling your at least 3 meals a day at certain times and taking meds at scheduled times.

## 2016-03-31 LAB — BASIC METABOLIC PANEL WITH GFR
BUN/Creatinine Ratio: 15 (ref 9–20)
BUN: 12 mg/dL (ref 6–20)
CO2: 21 mmol/L (ref 18–29)
Calcium: 9.7 mg/dL (ref 8.7–10.2)
Chloride: 97 mmol/L (ref 96–106)
Creatinine, Ser: 0.82 mg/dL (ref 0.76–1.27)
GFR calc Af Amer: 132 mL/min/{1.73_m2}
GFR calc non Af Amer: 114 mL/min/{1.73_m2}
Glucose: 355 mg/dL — ABNORMAL HIGH (ref 65–99)
Potassium: 4.5 mmol/L (ref 3.5–5.2)
Sodium: 137 mmol/L (ref 134–144)

## 2016-04-12 ENCOUNTER — Telehealth: Payer: Self-pay | Admitting: Licensed Clinical Social Worker

## 2016-04-12 NOTE — Telephone Encounter (Signed)
LCSW called pt in order to reschedule an appointment. This was the third attempt at contact, with no response.

## 2016-04-13 ENCOUNTER — Other Ambulatory Visit: Payer: Self-pay | Admitting: Licensed Clinical Social Worker

## 2016-04-25 ENCOUNTER — Ambulatory Visit: Payer: Self-pay | Admitting: Internal Medicine

## 2016-05-05 ENCOUNTER — Encounter (HOSPITAL_COMMUNITY): Payer: Self-pay | Admitting: *Deleted

## 2016-05-05 ENCOUNTER — Emergency Department (HOSPITAL_COMMUNITY)
Admission: EM | Admit: 2016-05-05 | Discharge: 2016-05-06 | Disposition: A | Discharge status: Home / Self Care | Payer: Self-pay | Attending: Emergency Medicine | Admitting: Emergency Medicine

## 2016-05-05 DIAGNOSIS — Z7984 Long term (current) use of oral hypoglycemic drugs: Secondary | ICD-10-CM | POA: Insufficient documentation

## 2016-05-05 DIAGNOSIS — F17210 Nicotine dependence, cigarettes, uncomplicated: Secondary | ICD-10-CM | POA: Insufficient documentation

## 2016-05-05 DIAGNOSIS — J45909 Unspecified asthma, uncomplicated: Secondary | ICD-10-CM | POA: Insufficient documentation

## 2016-05-05 DIAGNOSIS — L0291 Cutaneous abscess, unspecified: Secondary | ICD-10-CM

## 2016-05-05 DIAGNOSIS — L02412 Cutaneous abscess of left axilla: Secondary | ICD-10-CM | POA: Insufficient documentation

## 2016-05-05 DIAGNOSIS — Z794 Long term (current) use of insulin: Secondary | ICD-10-CM | POA: Insufficient documentation

## 2016-05-05 DIAGNOSIS — E119 Type 2 diabetes mellitus without complications: Secondary | ICD-10-CM | POA: Insufficient documentation

## 2016-05-05 MED ORDER — CHLORHEXIDINE GLUCONATE 4 % EX LIQD: Freq: Every day | CUTANEOUS | 0 refills | Status: DC | PRN

## 2016-05-05 MED ORDER — HYDROCODONE-ACETAMINOPHEN 5-325 MG PO TABS
2.0000 | ORAL_TABLET | Freq: Once | ORAL | Status: AC
  Administered 2016-05-05: 2 via ORAL
  Filled 2016-05-05: qty 2

## 2016-05-05 MED ORDER — HYDROCODONE-ACETAMINOPHEN 5-325 MG PO TABS: 2.0000 | ORAL_TABLET | Freq: Four times a day (QID) | ORAL | 0 refills | Status: DC | PRN

## 2016-05-05 MED ORDER — DOXYCYCLINE HYCLATE 100 MG PO CAPS: 100.0000 mg | ORAL_CAPSULE | Freq: Two times a day (BID) | ORAL | 0 refills | Status: DC

## 2016-05-05 MED ORDER — LIDOCAINE HCL 1 % IJ SOLN
INTRAMUSCULAR | Status: AC
  Administered 2016-05-05: 22:00:00
  Filled 2016-05-05: qty 20

## 2016-05-05 NOTE — ED Provider Notes (Signed)
[facility] DEPT Provider Note   CSN: [id number] Arrival date & time: 05/05/16  3888  First Provider Contact:  First MD Initiated Contact with Patient 05/05/16 2216        History   Chief Complaint Chief Complaint  Patient presents with  . Abscess    HPI Shawn Benson is a 35 y.o. male.  Patient presents to the ED with a chief complaint of abscesses.  He states that he has been having recurrent abscess on his axillas.  He states that several have drained spontaneously, but one is not draining and is worsening.  He denies any fevers, chills, or any other symptoms.  He is followed by Dr. Amil Amen and states he is seen monthly for his DM.   The history is provided by the patient. No language interpreter was used.    Past Medical History:  Diagnosis Date  . Allergy    Seasonal--spring through fall.  . Asthma childhood and adult   triggers are allergies and URI.  Spring, summer and fall.  . Morbid obesity [facility])     Patient Active Problem List   Diagnosis Date Noted  . Onychomycosis of toenail 12/06/2015  . DM (diabetes mellitus), type 2, uncontrolled ([place]) 10/07/2015  . Leukocytosis 10/07/2015  . Abscess of right buttock 10/06/2015  . Asthma 10/06/2015  . Morbid obesity due to excess calories [facility])     Past Surgical History:  Procedure Laterality Date  . INCISION AND DRAINAGE PERIRECTAL ABSCESS N/A 10/06/2015   Procedure: IRRIGATION AND DEBRIDEMENT PERIANAL  ABSCESS;  Surgeon: Alphonsa Overall, MD;  Location: [facility] ORS;  Service: General;  Laterality: N/A;  . KNEE SURGERY     Right and Left Knee       Home Medications    Prior to Admission medications   Medication Sig Start Date End Date Taking? Authorizing Provider  amoxicillin-clavulanate (AUGMENTIN) 875-125 MG tablet Take 1 tablet by mouth 2 (two) times daily. 03/30/16   Mack Hook, MD  Blood Glucose Monitoring Suppl (AGAMATRIX PRESTO) w/Device KIT 1 Device by Does not apply route 2 (two) times daily.  03/30/16   Mack Hook, MD  furosemide (LASIX) 20 MG tablet Take 1 tablet (20 mg total) by mouth daily. Patient not taking: Reported on 03/30/2016 12/06/15   Mack Hook, MD  glucose blood (AGAMATRIX PRESTO TEST) test strip Use as instructed 03/30/16   Mack Hook, MD  glucose blood test strip Use as instructed 10/03/15   Donne Hazel, MD  insulin NPH-regular Human (NOVOLIN 70/30) (70-30) 100 UNIT/ML injection Inject 35 Units into the skin 2 (two) times daily with a meal. Humulin 70/30 03/30/16   Mack Hook, MD  Insulin Syringe-Needle U-100 (INSULIN SYRINGE .5CC/31GX5/16") 31G X 5/16" 0.5 ML MISC 1 Device by Does not apply route 2 (two) times daily with a meal. 10/03/15   Donne Hazel, MD  Lancets (FREESTYLE) lancets Use as instructed 10/03/15   Donne Hazel, MD  metFORMIN (GLUCOPHAGE) 500 MG tablet Take 1 tablet (500 mg total) by mouth 2 (two) times daily with a meal. 11/07/15   Mack Hook, MD  terbinafine (LAMISIL) 250 MG tablet Take 1 tablet (250 mg total) by mouth daily. Patient not taking: Reported on 03/30/2016 12/06/15   Mack Hook, MD    Family History Family History  Problem Relation Age of Onset  . Diabetes Mother   . Diabetes Father   . Diabetes Brother     Social History Social History  Substance Use Topics  . Smoking  status: Current Every Day Smoker    Packs/day: 1.00    Types: Cigarettes    Last attempt to quit: 09/05/2015  . Smokeless tobacco: Never Used  . Alcohol use 0.0 oz/week     Comment: occasional beer     Allergies   Review of patient's allergies indicates no known allergies.   Review of Systems Review of Systems  All other systems reviewed and are negative.    Physical Exam Updated Vital Signs BP 114/74 (BP Location: Left Arm)   Temp 98.8 F (37.1 C) (Oral)   Resp 18   SpO2 98%   Physical Exam  Constitutional: He is oriented to person, place, and time. He appears well-developed and well-nourished.    HENT:  Head: Normocephalic and atraumatic.  Eyes: Conjunctivae and EOM are normal. Pupils are equal, round, and reactive to light. Right eye exhibits no discharge. Left eye exhibits no discharge. No scleral icterus.  Neck: Normal range of motion. Neck supple. No JVD present.  Cardiovascular: Normal rate, regular rhythm and normal heart sounds.  Exam reveals no gallop and no friction rub.   No murmur heard. Pulmonary/Chest: Effort normal and breath sounds normal. No respiratory distress. He has no wheezes. He has no rales. He exhibits no tenderness.  Abdominal: Soft. He exhibits no distension and no mass. There is no tenderness. There is no rebound and no guarding.  Musculoskeletal: Normal range of motion. He exhibits no edema or tenderness.  Neurological: He is alert and oriented to person, place, and time.  Skin: Skin is warm and dry.  3x4 cm left axillary abscess Several small draining pustules in bilateral axillas  Psychiatric: He has a normal mood and affect. His behavior is normal. Judgment and thought content normal.  Nursing note and vitals reviewed.    ED Treatments / Results  Labs (all labs ordered are listed, but only abnormal results are displayed) Labs Reviewed - No data to display  EKG  EKG Interpretation None       Radiology No results found.  Procedures Procedures (including critical care time)  Medications Ordered in ED Medications  HYDROcodone-acetaminophen (NORCO/VICODIN) 5-325 MG per tablet 2 tablet (not administered)  lidocaine (XYLOCAINE) 1 % (with pres) injection (  Given by Other 05/05/16 2218)     Initial Impression / Assessment and Plan / ED Course  I have reviewed the triage vital signs and the nursing notes.  Pertinent labs & imaging results that were available during my care of the patient were reviewed by me and considered in my medical decision making (see chart for details).  Clinical Course    INCISION AND DRAINAGE Performed by:  Montine Circle Consent: Verbal consent obtained. Risks and benefits: risks, benefits and alternatives were discussed Type: abscess  Body area: left axilla  Anesthesia: local infiltration  Incision was made with a scalpel.  Local anesthetic: lidocaine 1% without epinephrine  Anesthetic total: 5 ml  Complexity: complex Blunt dissection to break up loculations  Drainage: purulent  Drainage amount: copious  Packing material: not packed  Patient tolerance: Patient tolerated the procedure well with no immediate complications.     Final Clinical Impressions(s) / ED Diagnoses   Final diagnoses:  Abscess   Patient with recurrent abscess.  All are draining except one, which was drained in the ED.  Will give pain meds and doxycycline.  Recommend gen surg follow-up.  Will try chlorhexadine rinses.  New Prescriptions New Prescriptions   CHLORHEXIDINE (HIBICLENS) 4 % EXTERNAL LIQUID    Apply  topically daily as needed.   DOXYCYCLINE (VIBRAMYCIN) 100 MG CAPSULE    Take 1 capsule (100 mg total) by mouth 2 (two) times daily.   HYDROCODONE-ACETAMINOPHEN (NORCO/VICODIN) 5-325 MG TABLET    Take 2 tablets by mouth every 6 (six) hours as needed for severe pain.     Montine Circle, PA-C 05/05/16 2246    Carmin Muskrat, MD 05/06/16 [phone]

## 2016-05-05 NOTE — ED Triage Notes (Signed)
Pt complains of abscesses to left and right underarms for the past week. Pt denies fever.

## 2016-05-07 ENCOUNTER — Encounter: Payer: Self-pay | Admitting: Internal Medicine

## 2016-05-07 ENCOUNTER — Ambulatory Visit (INDEPENDENT_AMBULATORY_CARE_PROVIDER_SITE_OTHER): Payer: Self-pay | Admitting: Internal Medicine

## 2016-05-07 VITALS — BP 122/66 | HR 68 | Temp 98.1°F | Resp 16 | Ht 77.0 in | Wt 387.0 lb

## 2016-05-07 DIAGNOSIS — L02412 Cutaneous abscess of left axilla: Secondary | ICD-10-CM

## 2016-05-07 DIAGNOSIS — Z794 Long term (current) use of insulin: Secondary | ICD-10-CM

## 2016-05-07 DIAGNOSIS — E119 Type 2 diabetes mellitus without complications: Secondary | ICD-10-CM

## 2016-05-07 LAB — GLUCOSE, POCT (MANUAL RESULT ENTRY): POC Glucose: 302 mg/dl — AB (ref 70–99)

## 2016-05-07 MED ORDER — HYDROCODONE-ACETAMINOPHEN 5-325 MG PO TABS: ORAL_TABLET | ORAL | 0 refills | Status: DC

## 2016-05-07 NOTE — Progress Notes (Signed)
Subjective:    Patient ID: Shawn Benson, male    DOB: 05-Jun-1981, 35 y.o.   MRN: [id number]  HPI   1.  Left axillary abscess I and Ded in ED Saturday evening.  Was not able to get the antibiotic to subsequently treat until this morning due to cost.  Has the Doxycycline, but has not started yet. Has been washing his axilla with Dial soap and hot water.  Has not been able to soak his axillae.  Does not fit in tub.   No fevers He did get treatment with a 7 day course of Augmentin end of June, which resolved his furuncles for a couple of weeks, but then they recurred.   [facility] would not fill his hydrocodone Rx as he was filling his attached abx at another facility due to expense.  2.  DM 2:  Giving himself 35 units of Novolin 70/30 twice daily after meals instead of before meals.   Using 8 mm length needles and is concerned he may need a longer needle Taking Metformin twice daily Checking sugars 3-4 times daily.  Running in the 400-540.     Current Outpatient Prescriptions:  .  Blood Glucose Monitoring Suppl (AGAMATRIX PRESTO) w/Device KIT, 1 Device by Does not apply route 2 (two) times daily., Disp: 1 kit, Rfl: 0 .  doxycycline (VIBRAMYCIN) 100 MG capsule, Take 1 capsule (100 mg total) by mouth 2 (two) times daily., Disp: 20 capsule, Rfl: 0 .  glucose blood (AGAMATRIX PRESTO TEST) test strip, Use as instructed, Disp: 100 each, Rfl: 11 .  glucose blood test strip, Use as instructed, Disp: 100 each, Rfl: 0 .  insulin NPH-regular Human (NOVOLIN 70/30) (70-30) 100 UNIT/ML injection, Inject 35 Units into the skin 2 (two) times daily with a meal. Humulin 70/30, Disp: 10 mL, Rfl: 11 .  Insulin Syringe-Needle U-100 (INSULIN SYRINGE .5CC/31GX5/16") 31G X 5/16" 0.5 ML MISC, 1 Device by Does not apply route 2 (two) times daily with a meal., Disp: 100 each, Rfl: 0 .  Lancets (FREESTYLE) lancets, Use as instructed, Disp: 100 each, Rfl: 0 .  metFORMIN (GLUCOPHAGE) 500 MG tablet, Take 1 tablet (500 mg  total) by mouth 2 (two) times daily with a meal., Disp: 60 tablet, Rfl: 11 .  amoxicillin-clavulanate (AUGMENTIN) 875-125 MG tablet, Take 1 tablet by mouth 2 (two) times daily. (Patient not taking: Reported on 05/07/2016), Disp: 14 tablet, Rfl: 0 .  chlorhexidine (HIBICLENS) 4 % external liquid, Apply topically daily as needed. (Patient not taking: Reported on 05/07/2016), Disp: 120 mL, Rfl: 0 .  furosemide (LASIX) 20 MG tablet, Take 1 tablet (20 mg total) by mouth daily. (Patient not taking: Reported on 03/30/2016), Disp: 30 tablet, Rfl: 11 .  HYDROcodone-acetaminophen (NORCO/VICODIN) 5-325 MG tablet, Take 2 tablets by mouth every 6 (six) hours as needed for severe pain. (Patient not taking: Reported on 05/07/2016), Disp: 10 tablet, Rfl: 0 .  terbinafine (LAMISIL) 250 MG tablet, Take 1 tablet (250 mg total) by mouth daily. (Patient not taking: Reported on 03/30/2016), Disp: 90 tablet, Rfl: 0   No Known Allergies    Review of Systems     Objective:   Physical Exam Moderate discomfort Left axilla with 5 cm by 3 cm swelling with 1/2 cm opening draining serosanguinous and intermittently purulent discharge.   Has smaller pustules on lateral chest wall and opening that currently is without drainage on lateral chest wall.  Not clear if this is contiguous with the abscess in axilla.  Small  pustules in left axilla with an opening similar on lateral chest wall currently without drainage.  Right axilla abscess cleaned with povidine x 3.  6 cc 1% lidocaine utilized at either end of previously I and D opening and incision extended.  A large amount of clots and thick yellow pustular material was expelled with pressure.  Sterile curved hemostats used to break up any walls, 1/2 inch sterile nu gauze utilized to pack the abscess space and left with small wick. Pt. Tolerated well.  ABD pad dressing placed to absorb discharge.        Assessment & Plan:  1.  Abscess, left axilla:  Patient given one of his  doxycycline tabs in office. To call if fever, increased pain, redness, or go to ED if particularly not feeling well,  but will see him back otherwise to reassess and change dresssing tomorrow morning.   Rewrote Hydrocodone Rx as [facility] would not fill since he was taking the attached antibiotic Rx to another pharmacy.   Original Rx shredded.  2.  DM:  Suspect poor control partly due to illness as well as recent noncompliance.  Increase Novolin 70/30 to 40 units SQ twice daily.  Pt. Given box of 1/2 inch needles for insulin injection.   Push water intake

## 2016-05-07 NOTE — Patient Instructions (Signed)
Call if you have increased pain, fever, significant bleeding Increase your insulin to 40 units twice daily before meals. Push fluids

## 2016-05-08 ENCOUNTER — Ambulatory Visit (INDEPENDENT_AMBULATORY_CARE_PROVIDER_SITE_OTHER): Payer: Self-pay | Admitting: Internal Medicine

## 2016-05-08 ENCOUNTER — Encounter: Payer: Self-pay | Admitting: Internal Medicine

## 2016-05-08 VITALS — BP 120/70 | HR 62 | Resp 18 | Ht 77.0 in | Wt 387.0 lb

## 2016-05-08 DIAGNOSIS — E119 Type 2 diabetes mellitus without complications: Secondary | ICD-10-CM

## 2016-05-08 DIAGNOSIS — IMO0002 Reserved for concepts with insufficient information to code with codable children: Secondary | ICD-10-CM

## 2016-05-08 DIAGNOSIS — L02412 Cutaneous abscess of left axilla: Secondary | ICD-10-CM

## 2016-05-08 DIAGNOSIS — E11628 Type 2 diabetes mellitus with other skin complications: Secondary | ICD-10-CM

## 2016-05-08 DIAGNOSIS — Z794 Long term (current) use of insulin: Secondary | ICD-10-CM

## 2016-05-08 DIAGNOSIS — E1165 Type 2 diabetes mellitus with hyperglycemia: Secondary | ICD-10-CM

## 2016-05-08 LAB — GLUCOSE, POCT (MANUAL RESULT ENTRY): POC GLUCOSE: 369 mg/dL — AB (ref 70–99)

## 2016-05-08 MED ORDER — INSULIN NPH ISOPHANE & REGULAR (70-30) 100 UNIT/ML ~~LOC~~ SUSP: SUBCUTANEOUS | 11 refills | Status: DC

## 2016-05-08 NOTE — Progress Notes (Signed)
   Subjective:    Patient ID: Shawn Benson, male    DOB: May 21, 1981, 35 y.o.   MRN: [id number]  HPI   1.  Abscess, Left Axilla:  Here for wound check.  Feeling better.  Has taken the Doxycycline now for 3 doses.  Not warm packing the smaller furuncles as discussed. Actually did some physical labor after seen yesterday.  Discussed that would not recommend that with his current illness.  2.  DM:  Sugars still ranging from mid 300s to low 400s.  Did give 40 units of insulin mix twice yesterday and this morning.  421 this morning before coming in.  Current Meds  Medication Sig  . Blood Glucose Monitoring Suppl (AGAMATRIX PRESTO) w/Device KIT 1 Device by Does not apply route 2 (two) times daily.  Marland Kitchen doxycycline (VIBRAMYCIN) 100 MG capsule Take 1 capsule (100 mg total) by mouth 2 (two) times daily.  Marland Kitchen glucose blood (AGAMATRIX PRESTO TEST) test strip Use as instructed  . glucose blood test strip Use as instructed  . HYDROcodone-acetaminophen (NORCO/VICODIN) 5-325 MG tablet 1-2 tabs every 4-6 hours as needed for pain  . insulin NPH-regular Human (NOVOLIN 70/30) (70-30) 100 UNIT/ML injection 45 units subcutaneously twice daily before meals  . Insulin Syringe-Needle U-100 (INSULIN SYRINGE .5CC/31GX5/16") 31G X 5/16" 0.5 ML MISC 1 Device by Does not apply route 2 (two) times daily with a meal.  . Lancets (FREESTYLE) lancets Use as instructed  . metFORMIN (GLUCOPHAGE) 500 MG tablet Take 1 tablet (500 mg total) by mouth 2 (two) times daily with a meal.  . [DISCONTINUED] insulin NPH-regular Human (NOVOLIN 70/30) (70-30) 100 UNIT/ML injection Inject 35 Units into the skin 2 (two) times daily with a meal. Humulin 70/30 (Patient taking differently: Inject 40 Units into the skin 2 (two) times daily with a meal. Humulin 70/30)     No Known Allergies    Review of Systems     Objective:   Physical Exam  Smells strongly of cigarette smoke Left axilla:  Abscessed area significantly smaller.   Wick/packing removed.  Much less tender.   Packing replaced to lesser degree and redressed.   Still with scattered smaller furuncles on lateral chest/axilla, one 2 cm area of induration, but no fluctuance.       Assessment & Plan:  1.  Left Axillary Abscess:  Repacked.  Continue Doxycycline.  Return tomorrow  2.  DM:  Increase to 70/30 mix to 40 units twice daily  3.  Encouraged stopping his smoking again.  Will address more signficantly once through this illness

## 2016-05-09 ENCOUNTER — Ambulatory Visit (INDEPENDENT_AMBULATORY_CARE_PROVIDER_SITE_OTHER): Payer: Self-pay | Admitting: Internal Medicine

## 2016-05-09 ENCOUNTER — Encounter: Payer: Self-pay | Admitting: Internal Medicine

## 2016-05-09 VITALS — BP 118/70 | HR 68 | Resp 18 | Ht 77.0 in | Wt 387.0 lb

## 2016-05-09 DIAGNOSIS — L02411 Cutaneous abscess of right axilla: Secondary | ICD-10-CM

## 2016-05-09 DIAGNOSIS — E11628 Type 2 diabetes mellitus with other skin complications: Secondary | ICD-10-CM

## 2016-05-09 DIAGNOSIS — IMO0002 Reserved for concepts with insufficient information to code with codable children: Secondary | ICD-10-CM

## 2016-05-09 DIAGNOSIS — E1165 Type 2 diabetes mellitus with hyperglycemia: Secondary | ICD-10-CM

## 2016-05-09 LAB — GLUCOSE, POCT (MANUAL RESULT ENTRY): POC Glucose: 322 mg/dl — AB (ref 70–99)

## 2016-05-10 ENCOUNTER — Ambulatory Visit (INDEPENDENT_AMBULATORY_CARE_PROVIDER_SITE_OTHER): Payer: Self-pay | Admitting: Internal Medicine

## 2016-05-10 DIAGNOSIS — L02412 Cutaneous abscess of left axilla: Secondary | ICD-10-CM

## 2016-05-10 MED ORDER — DOXYCYCLINE HYCLATE 100 MG PO CAPS: ORAL_CAPSULE | ORAL | 0 refills | Status: DC

## 2016-05-11 ENCOUNTER — Encounter: Payer: Self-pay | Admitting: Internal Medicine

## 2016-05-11 ENCOUNTER — Ambulatory Visit (INDEPENDENT_AMBULATORY_CARE_PROVIDER_SITE_OTHER): Payer: Self-pay | Admitting: Internal Medicine

## 2016-05-11 VITALS — BP 112/60 | HR 68 | Resp 18 | Ht 77.0 in | Wt 387.0 lb

## 2016-05-11 DIAGNOSIS — L02412 Cutaneous abscess of left axilla: Secondary | ICD-10-CM

## 2016-05-11 DIAGNOSIS — E11628 Type 2 diabetes mellitus with other skin complications: Secondary | ICD-10-CM

## 2016-05-11 DIAGNOSIS — IMO0002 Reserved for concepts with insufficient information to code with codable children: Secondary | ICD-10-CM

## 2016-05-11 DIAGNOSIS — E1165 Type 2 diabetes mellitus with hyperglycemia: Secondary | ICD-10-CM

## 2016-05-11 LAB — GLUCOSE, POCT (MANUAL RESULT ENTRY): POC Glucose: 297 mg/dl — AB (ref 70–99)

## 2016-05-11 MED ORDER — DOXYCYCLINE HYCLATE 100 MG PO CAPS: ORAL_CAPSULE | ORAL | 0 refills | Status: DC

## 2016-05-11 NOTE — Progress Notes (Signed)
   Subjective:    Patient ID: Shawn Benson, male    DOB: Sep 13, 1981, 35 y.o.   MRN: [id number]  HPI   Left axillary abscess:  Improving.  STill with fair amount discharge  DM:  Pt. Actually increased to 50 units twice daily of insulin yesterday as was at 45 units twice daily the day before.  States he is eating healthy.  Sugars still in 300s  Current Meds  Medication Sig  . Blood Glucose Monitoring Suppl (AGAMATRIX PRESTO) w/Device KIT 1 Device by Does not apply route 2 (two) times daily.  Marland Kitchen doxycycline (VIBRAMYCIN) 100 MG capsule 1 tab by mouth twice daily for 5 more days (total of 15 day course)  . furosemide (LASIX) 20 MG tablet Take 1 tablet (20 mg total) by mouth daily.  Marland Kitchen glucose blood (AGAMATRIX PRESTO TEST) test strip Use as instructed  . glucose blood test strip Use as instructed  . insulin NPH-regular Human (NOVOLIN 70/30) (70-30) 100 UNIT/ML injection 45 units subcutaneously twice daily before meals (Patient taking differently: Inject 50 Units into the skin. 45 units subcutaneously twice daily before meals)  . Insulin Syringe-Needle U-100 (INSULIN SYRINGE .5CC/31GX5/16") 31G X 5/16" 0.5 ML MISC 1 Device by Does not apply route 2 (two) times daily with a meal.  . Lancets (FREESTYLE) lancets Use as instructed  . metFORMIN (GLUCOPHAGE) 500 MG tablet Take 1 tablet (500 mg total) by mouth 2 (two) times daily with a meal.       Review of Systems     Objective:   Physical Exam  Left axilla:  Still with induration and scant purulent bloody discharge after removal of packing.  No fluctuance.  Abscess continues to shrink and much less tender.  Scattered furuncles on lateral chest walls bilaterally smaller and less induration as well.      Assessment & Plan:  1.  Left Axillary abscess:  No packing today.  Pt. Would like to follow up tomorrow again.  Extend treatment of Doxycycline to 15 days total--Rx to [facility] pharmacy  2.  DM:  Continue with 50 units twice daily.  Discussed  as heals and becomes more active, will need to wean insulin.

## 2016-05-11 NOTE — Progress Notes (Signed)
   Subjective:    Patient ID: [place] P Romo, male    DOB: Mar 16, 1981, 35 y.o.   MRN: [id number]  HPI   1.  Left axillary abscess:  Much less pain.  Did drain, but not soak dressing.  All of the furuncles seem to be getting smaller. Continues Doxycycline Did not wash the wound in the shower as recommended. Not clear he is warm packing other areas as recommended.  2.  DM:  Sugars now dropping into 200s, but also still in 300s.  Using 50 units twice daily.  Current Meds  Medication Sig  . Blood Glucose Monitoring Suppl (AGAMATRIX PRESTO) w/Device KIT 1 Device by Does not apply route 2 (two) times daily.  Marland Kitchen doxycycline (VIBRAMYCIN) 100 MG capsule 1 tab by mouth twice daily for 5 more days (total of 15 day course)  . glucose blood (AGAMATRIX PRESTO TEST) test strip Use as instructed  . glucose blood test strip Use as instructed  . HYDROcodone-acetaminophen (NORCO/VICODIN) 5-325 MG tablet 1-2 tabs every 4-6 hours as needed for pain (Patient not taking: Reported on 05/11/2016)  . insulin NPH-regular Human (NOVOLIN 70/30) (70-30) 100 UNIT/ML injection 45 units subcutaneously twice daily before meals (Patient taking differently: Inject 50 Units into the skin. 45 units subcutaneously twice daily before meals)  . Insulin Syringe-Needle U-100 (INSULIN SYRINGE .5CC/31GX5/16") 31G X 5/16" 0.5 ML MISC 1 Device by Does not apply route 2 (two) times daily with a meal.  . Lancets (FREESTYLE) lancets Use as instructed  . metFORMIN (GLUCOPHAGE) 500 MG tablet Take 1 tablet (500 mg total) by mouth 2 (two) times daily with a meal.    Review of Systems     Objective:   Physical Exam Left axillary abscess:  Again smaller, with scant discharge.  Still with some induration.  Other furuncles continue to decrease in size as well.          Assessment & Plan:  Left axillary abscess:  To wash in shower twice daily with dial soap and make sure all water expelled. Continue Doxycycline.  DM:  Continue with 50  units twice daily for now with plans to wean as sugars hopefully drop.

## 2016-05-11 NOTE — Progress Notes (Signed)
   Subjective:    Patient ID: Shawn Benson, male    DOB: 01-10-1981, 35 y.o.   MRN: [id number]  HPI   1.  Abscess, Left axilla:  Improving. Soaking through dressing still.  2.  DM:  Sugars still generally in upper 300s.    Review of Systems     Objective:   Physical Exam  Left Axillary abscess:  Packing removed.  Soaked with yellow discharge.  Wound continues to decrease in size.  Small amounts of pus and blood expressed. Scattered areas of small pustules on bilateral chest wall areas beneath axilla. Wound repacked with wick      Assessment & Plan:  Left Axillary abscess:  Continue Doxycycline.  Return tomorrow for dressing change and wound check again.  DM:  Increase to 45 units of 70/30 twice daily  Tobacco Abuse:  Not interested in quitting currently.

## 2016-05-15 ENCOUNTER — Encounter: Payer: Self-pay | Admitting: Internal Medicine

## 2016-05-15 ENCOUNTER — Ambulatory Visit (INDEPENDENT_AMBULATORY_CARE_PROVIDER_SITE_OTHER): Payer: Self-pay | Admitting: Internal Medicine

## 2016-05-15 VITALS — BP 112/62 | HR 67 | Temp 98.1°F | Resp 18 | Ht 77.0 in | Wt 387.0 lb

## 2016-05-15 DIAGNOSIS — E1165 Type 2 diabetes mellitus with hyperglycemia: Secondary | ICD-10-CM

## 2016-05-15 DIAGNOSIS — IMO0002 Reserved for concepts with insufficient information to code with codable children: Secondary | ICD-10-CM

## 2016-05-15 DIAGNOSIS — E11628 Type 2 diabetes mellitus with other skin complications: Secondary | ICD-10-CM

## 2016-05-15 LAB — GLUCOSE, POCT (MANUAL RESULT ENTRY): POC GLUCOSE: 434 mg/dL — AB (ref 70–99)

## 2016-05-15 NOTE — Progress Notes (Signed)
   Subjective:    Patient ID: Shawn Benson, male    DOB: 1981-08-28, 35 y.o.   MRN: [id number]  HPI 1.  Left axillary abscess:  Much improved.  No drainage any longer and no longer painful.  States he has 8 days of Doxycycline left or perhaps 8 pills, which should not be the case as he should be on day 8 of 10.  He has not yet picked up the additional 5 days I sent in last week.  Strongly urged him to be sure he is taking pills appropriately and to get the extra 5 days picked up so there is not a gap in treatment.  2.  DM:  Sugars still 300-400 +.  Is not eating well.  States he is giving himself 50 units of Novolin 70/30 twice daily.  Not clear if he is missing from time to time.. Current Meds  Medication Sig  . Blood Glucose Monitoring Suppl (AGAMATRIX PRESTO) w/Device KIT 1 Device by Does not apply route 2 (two) times daily.  Marland Kitchen doxycycline (VIBRAMYCIN) 100 MG capsule 1 tab by mouth twice daily for 5 more days (total of 15 day course)  . glucose blood (AGAMATRIX PRESTO TEST) test strip Use as instructed  . glucose blood test strip Use as instructed  . insulin NPH-regular Human (NOVOLIN 70/30) (70-30) 100 UNIT/ML injection 45 units subcutaneously twice daily before meals (Patient taking differently: Inject 50 Units into the skin. 45 units subcutaneously twice daily before meals)  . Insulin Syringe-Needle U-100 (INSULIN SYRINGE .5CC/31GX5/16") 31G X 5/16" 0.5 ML MISC 1 Device by Does not apply route 2 (two) times daily with a meal.  . Lancets (FREESTYLE) lancets Use as instructed  . metFORMIN (GLUCOPHAGE) 500 MG tablet Take 1 tablet (500 mg total) by mouth 2 (two) times daily with a meal.   No Known Allergies  Review of Systems     Objective:   Physical Exam Scattered furuncles on bilateral axillary trunk are all much smaller and drying up.  No tenderness over these area. Left axillary abscess currently with closed opening with I & D and now induration now only about 2 cm.  No  tenderness with palpation over this and no discharge.         Assessment & Plan:  1.  Left Axillary Abscess:  AGain, discussion about compliance with treatment so diabetes is better controlled to avoid recurrent furunculosis and abscess.  This is much better however.  2.  DM:  As above, needs to work on diet and taking medication appropriately.

## 2016-05-15 NOTE — Patient Instructions (Signed)
GEt the rest of your antibiotic filled today Keep your upcoming appt.

## 2016-05-25 ENCOUNTER — Encounter: Payer: Self-pay | Admitting: Internal Medicine

## 2016-05-25 ENCOUNTER — Ambulatory Visit (INDEPENDENT_AMBULATORY_CARE_PROVIDER_SITE_OTHER): Payer: Self-pay | Admitting: Internal Medicine

## 2016-05-25 VITALS — BP 118/66 | HR 64 | Temp 98.6°F | Resp 16 | Ht 77.75 in | Wt 387.0 lb

## 2016-05-25 DIAGNOSIS — IMO0002 Reserved for concepts with insufficient information to code with codable children: Secondary | ICD-10-CM

## 2016-05-25 DIAGNOSIS — E1165 Type 2 diabetes mellitus with hyperglycemia: Secondary | ICD-10-CM

## 2016-05-25 DIAGNOSIS — L02412 Cutaneous abscess of left axilla: Secondary | ICD-10-CM

## 2016-05-25 DIAGNOSIS — E11628 Type 2 diabetes mellitus with other skin complications: Secondary | ICD-10-CM

## 2016-05-25 LAB — GLUCOSE, POCT (MANUAL RESULT ENTRY): POC Glucose: 255 mg/dl — AB (ref 70–99)

## 2016-05-25 NOTE — Patient Instructions (Signed)
Warm soaks or packs to left axillary and scrotal boils.  20 minutes 3 times daily. Write down your sugars and bring them in --check them twice daily, before breakfast every day and mainly before dinner for the second check, but may check before lunch or at bedtime as well Write down the times you are eating breakfast lunch and dinner.

## 2016-05-25 NOTE — Progress Notes (Signed)
   Subjective:    Patient ID: Shawn Benson, male    DOB: [birth year], 35 y.o.   MRN: [id number]  HPI   1.  Left axillary abscess with generalized bilateral axillary and lateral chest wall furunculosis.  Pt. Still has Doxycycline left.  Should have finished 4 days ago.  States he only missed one day of medication. Just saw another pustule under his left arm today.  Discussed resistance if he does not take med continuously. Has a pustule under his scrotum today as well.  Unwilling to have me take a look.   2.  DM type 2 Insulin Requiring:  Sugars generally running in the 200s.  States he is using 50 units of Novolin 70/30 twice daily.  Using before meals.  Also did not use insulin Monday as well.   Current Meds  Medication Sig  . Blood Glucose Monitoring Suppl (AGAMATRIX PRESTO) w/Device KIT 1 Device by Does not apply route 2 (two) times daily.  Marland Kitchen doxycycline (VIBRAMYCIN) 100 MG capsule 1 tab by mouth twice daily for 5 more days (total of 15 day course)  . furosemide (LASIX) 20 MG tablet Take 1 tablet (20 mg total) by mouth daily.  Marland Kitchen glucose blood (AGAMATRIX PRESTO TEST) test strip Use as instructed  . glucose blood test strip Use as instructed  . insulin NPH-regular Human (NOVOLIN 70/30) (70-30) 100 UNIT/ML injection 45 units subcutaneously twice daily before meals (Patient taking differently: Inject 50 Units into the skin. 45 units subcutaneously twice daily before meals)  . Insulin Syringe-Needle U-100 (INSULIN SYRINGE .5CC/31GX5/16") 31G X 5/16" 0.5 ML MISC 1 Device by Does not apply route 2 (two) times daily with a meal.  . Lancets (FREESTYLE) lancets Use as instructed  . metFORMIN (GLUCOPHAGE) 500 MG tablet Take 1 tablet (500 mg total) by mouth 2 (two) times daily with a meal.   No Known Allergies   Review of Systems     Objective:   Physical Exam Falling asleep  During the exam--denies Lungs:  CTA CV:  RRR without murmur or rub, radial pulses normal and equal. Skin:  Left  axillary area with posterior axillary line nickel sized induration with mild erythema overlying, no pustular head. Behind scrotum, has dime sized furuncle with head and mild erythema.  Previous areas of abscess and drainage have closed and are healing nicely without erythema or induration.         Assessment & Plan:  1.  Axillary furunculosis and abscess formation:  A couple of new areas starting, likely as patient has not taken Doxycycline continuously.  Should have finished abx 3 days ago at least and has a pill left.  Warm compresses twice daily or warm water soaks.  To call if worsens.  2.  DM:  Noncompliance.  Long discussion again about lifestyle changes to avoid further DM complications.  Until he works on getting sugars under control, do not believe he will control other aspects of his health.  Needs to check sugars twice daily and document.  Follow up in 6 weeks.  3. Daytime somnolence:  Cannot get a good history from patient with sleep.  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   States he slept well last night.  Reportedly does snore. He will apply for Financial Assistance from [facility] and I will refer for sleep study

## 2016-05-27 ENCOUNTER — Encounter: Payer: Self-pay | Admitting: Internal Medicine

## 2016-07-10 ENCOUNTER — Ambulatory Visit (INDEPENDENT_AMBULATORY_CARE_PROVIDER_SITE_OTHER): Payer: Self-pay | Admitting: Internal Medicine

## 2016-07-10 ENCOUNTER — Encounter: Payer: Self-pay | Admitting: Internal Medicine

## 2016-07-10 VITALS — BP 116/78 | HR 66 | Ht 78.5 in

## 2016-07-10 DIAGNOSIS — G4730 Sleep apnea, unspecified: Secondary | ICD-10-CM

## 2016-07-10 DIAGNOSIS — Z794 Long term (current) use of insulin: Secondary | ICD-10-CM

## 2016-07-10 DIAGNOSIS — IMO0002 Reserved for concepts with insufficient information to code with codable children: Secondary | ICD-10-CM

## 2016-07-10 DIAGNOSIS — E11628 Type 2 diabetes mellitus with other skin complications: Secondary | ICD-10-CM

## 2016-07-10 DIAGNOSIS — Z79899 Other long term (current) drug therapy: Secondary | ICD-10-CM

## 2016-07-10 DIAGNOSIS — B354 Tinea corporis: Secondary | ICD-10-CM

## 2016-07-10 DIAGNOSIS — B353 Tinea pedis: Secondary | ICD-10-CM

## 2016-07-10 DIAGNOSIS — B351 Tinea unguium: Secondary | ICD-10-CM

## 2016-07-10 DIAGNOSIS — E1165 Type 2 diabetes mellitus with hyperglycemia: Secondary | ICD-10-CM

## 2016-07-10 LAB — GLUCOSE, POCT (MANUAL RESULT ENTRY): POC GLUCOSE: 103 mg/dL — AB (ref 70–99)

## 2016-07-10 MED ORDER — INSULIN NPH ISOPHANE & REGULAR (70-30) 100 UNIT/ML ~~LOC~~ SUSP: 52.0000 [IU] | Freq: Two times a day (BID) | SUBCUTANEOUS | 11 refills | Status: DC

## 2016-07-10 MED ORDER — TERBINAFINE HCL 250 MG PO TABS: 250.0000 mg | ORAL_TABLET | Freq: Every day | ORAL | 0 refills | Status: DC

## 2016-07-10 NOTE — Progress Notes (Signed)
Subjective:    Patient ID: Shawn Benson, male    DOB: Jan 15, 1981, 35 y.o.   MRN: [id number]  HPI   1.  DM Type 2, insulin requiring:  Using Humulin 70/30, 52 units twice daily. Patient is now keeping track of glucose levels and giving himself insulin regularly Sugars are running mainly below 150 twice daily for past month.  Only 3 checks over 33 checks have been over 150 and all below 160.   Does feel he has more energy.  Is working out in the yard more and trying to do more outdoor activity.   His family has noted he has more energy.  His son, who is overweight as well is going out with him. States his family is eating healthier now.   Stopped sodas and a lot of pastas/starches. Does not feel he has lost weight--we are unable to get his weight here as above 350 lbs   Has not had flu vaccine. Would like to wait until the next free flu shot clinic Nov 16th with orange card sign up. Is checking feet every night. Does have glucose tabs should his sugars fall too low.  2.  Question of sleep apnea:  Long time girlfriend states he does snore and then stops breathing.  Can fall asleep anywhere during the day.  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 Needs a new financial assistance application for [facility] as he misplaced previous one given.  Current Meds  Medication Sig  . furosemide (LASIX) 20 MG tablet Take 1 tablet (20 mg total) by mouth daily.  Marland Kitchen glucose blood (AGAMATRIX PRESTO TEST) test strip Use as instructed  . glucose blood test strip Use as instructed  . insulin NPH-regular Human (NOVOLIN 70/30) (70-30) 100 UNIT/ML injection 45 units subcutaneously twice daily before meals (Patient taking differently: 52 Units 2 (two) times daily. 52 units subcutaneously twice daily before meals)  . metFORMIN (GLUCOPHAGE) 500 MG tablet Take 1 tablet (500 mg total) by mouth 2 (two) times daily with a meal.    No Known Allergies       Review of Systems       Objective:   Physical Exam  Much more interactive Appears a bit slimmer Lungs:  CTA CV:  RRR without murmur or rub.  Radial pulses normal and equal Skin:  1 cm furuncle left lateral thorax and right lateral thigh. Flaking in skin fold above pubis across entire abdominal expanse.   Feet with all nails involved with discoloration, thickening and nail separation.  Flaking at heel edges and in between all toes. Lab Results  Component Value Date   POCGLU 103 (A) 07/10/2016         Assessment & Plan:  1.  DM Type 2 with long term insulin.  Now well controlled for past 30+ days.  To continue 52 units SQ twice daily.  May wean if he sees his sugars dropping below 100 regularly. He wants to wait for influenza vaccine at orange card sign up in November. Encouraged continued work on diet and physical activity. Very impressed with changes. FLP, A1C and urine microalbumin/crea in 2 months prior to his follow up with me.  2.  Probably Sleep Apnea:  He has not done the application for Financial Assistance at [facility].  Sending him that application as did not pick up one on way out.  Will hold on order for sleep study until he finds out whether he can get  significant assistance.  3.  Toenail onychomycosis/Tinea corporis and pedis:  Discussed foot care nightly.  Terbinafine 250 mg once daily for 90 days.  Check CMP for baseline.   Shoe and shower floor care discussed.

## 2016-07-10 NOTE — Patient Instructions (Addendum)
For foot and toenail fungus:  Spray your shoes with Lysol or Lotrimin Antifungal spray when you start the medication for your feet.   Re-spray your the shoes you wear with each wearing and allow to dry before wearing again You will need to continue to do this until your toenails and feet are normal and until the shoes you have during the infection of your feet and toes are thrown out. Clean your shower floor once to twice daily with bleach containing cleaner.  Use a mixture of Gold Bond foot cream and Tea Tree oil on your feet daily at bedtime, especially after bathing as the skin will absorb the mixture better then.

## 2016-07-11 LAB — COMPREHENSIVE METABOLIC PANEL
ALBUMIN: 4.1 g/dL (ref 3.5–5.5)
ALT: 11 IU/L (ref 0–44)
AST: 12 IU/L (ref 0–40)
Albumin/Globulin Ratio: 1.4 (ref 1.2–2.2)
Alkaline Phosphatase: 86 IU/L (ref 39–117)
BUN / CREAT RATIO: 15 (ref 9–20)
BUN: 12 mg/dL (ref 6–20)
Bilirubin Total: 0.3 mg/dL (ref 0.0–1.2)
CALCIUM: 9 mg/dL (ref 8.7–10.2)
CO2: 25 mmol/L (ref 18–29)
CREATININE: 0.81 mg/dL (ref 0.76–1.27)
Chloride: 100 mmol/L (ref 96–106)
GFR, EST AFRICAN AMERICAN: 131 mL/min/{1.73_m2} (ref >59)
GFR, EST NON AFRICAN AMERICAN: 114 mL/min/{1.73_m2} (ref >59)
GLUCOSE: 83 mg/dL (ref 65–99)
Globulin, Total: 2.9 g/dL (ref 1.5–4.5)
Potassium: 3.8 mmol/L (ref 3.5–5.2)
Sodium: 139 mmol/L (ref 134–144)
TOTAL PROTEIN: 7 g/dL (ref 6.0–8.5)

## 2016-08-13 ENCOUNTER — Telehealth: Payer: Self-pay | Admitting: Internal Medicine

## 2016-08-13 NOTE — Telephone Encounter (Signed)
Patient wants to know if he can use a heating pad on his back.

## 2016-08-14 NOTE — Telephone Encounter (Signed)
Yes as long as stays at medium or lower heat and does not fall asleep while using.

## 2016-08-15 NOTE — Telephone Encounter (Signed)
Patient notified

## 2016-09-11 ENCOUNTER — Other Ambulatory Visit (INDEPENDENT_AMBULATORY_CARE_PROVIDER_SITE_OTHER): Payer: Self-pay

## 2016-09-11 DIAGNOSIS — Z79899 Other long term (current) drug therapy: Secondary | ICD-10-CM

## 2016-09-11 DIAGNOSIS — Z794 Long term (current) use of insulin: Principal | ICD-10-CM

## 2016-09-11 DIAGNOSIS — IMO0002 Reserved for concepts with insufficient information to code with codable children: Secondary | ICD-10-CM

## 2016-09-11 DIAGNOSIS — E1165 Type 2 diabetes mellitus with hyperglycemia: Principal | ICD-10-CM

## 2016-09-11 DIAGNOSIS — Z13220 Encounter for screening for lipoid disorders: Secondary | ICD-10-CM

## 2016-09-11 DIAGNOSIS — E118 Type 2 diabetes mellitus with unspecified complications: Secondary | ICD-10-CM

## 2016-09-12 LAB — CBC
HEMATOCRIT: 39.6 % (ref 37.5–51.0)
HEMOGLOBIN: 12.6 g/dL — AB (ref 13.0–17.7)
MCH: 25.1 pg — ABNORMAL LOW (ref 26.6–33.0)
MCHC: 31.8 g/dL (ref 31.5–35.7)
MCV: 79 fL (ref 79–97)
Platelets: 298 10*3/uL (ref 150–379)
RBC: 5.01 x10E6/uL (ref 4.14–5.80)
RDW: 16 % — ABNORMAL HIGH (ref 12.3–15.4)
WBC: 8.5 10*3/uL (ref 3.4–10.8)

## 2016-09-12 LAB — COMPREHENSIVE METABOLIC PANEL
A/G RATIO: 1.2 (ref 1.2–2.2)
ALBUMIN: 4.1 g/dL (ref 3.5–5.5)
ALT: 14 IU/L (ref 0–44)
AST: 12 IU/L (ref 0–40)
Alkaline Phosphatase: 87 IU/L (ref 39–117)
BILIRUBIN TOTAL: 0.2 mg/dL (ref 0.0–1.2)
BUN / CREAT RATIO: 15 (ref 9–20)
BUN: 13 mg/dL (ref 6–20)
CHLORIDE: 103 mmol/L (ref 96–106)
CO2: 26 mmol/L (ref 18–29)
Calcium: 9.2 mg/dL (ref 8.7–10.2)
Creatinine, Ser: 0.88 mg/dL (ref 0.76–1.27)
GFR calc non Af Amer: 110 mL/min/{1.73_m2} (ref >59)
GFR, EST AFRICAN AMERICAN: 127 mL/min/{1.73_m2} (ref >59)
Globulin, Total: 3.3 g/dL (ref 1.5–4.5)
Glucose: 135 mg/dL — ABNORMAL HIGH (ref 65–99)
POTASSIUM: 4.5 mmol/L (ref 3.5–5.2)
SODIUM: 144 mmol/L (ref 134–144)
TOTAL PROTEIN: 7.4 g/dL (ref 6.0–8.5)

## 2016-09-12 LAB — LIPID PANEL
Chol/HDL Ratio: 3.9 ratio units (ref 0.0–5.0)
Cholesterol, Total: 141 mg/dL (ref 100–199)
HDL: 36 mg/dL — ABNORMAL LOW (ref >39)
LDL Calculated: 87 mg/dL (ref 0–99)
Triglycerides: 91 mg/dL (ref 0–149)
VLDL Cholesterol Cal: 18 mg/dL (ref 5–40)

## 2016-09-12 LAB — MICROALBUMIN / CREATININE URINE RATIO
CREATININE, UR: 118.8 mg/dL
MICROALBUM., U, RANDOM: 12.2 ug/mL
Microalb/Creat Ratio: 10.3 mg/g creat (ref 0.0–30.0)

## 2016-09-12 LAB — HEMOGLOBIN A1C
Est. average glucose Bld gHb Est-mCnc: 171 mg/dL
Hgb A1c MFr Bld: 7.6 % — ABNORMAL HIGH (ref 4.8–5.6)

## 2016-09-13 ENCOUNTER — Ambulatory Visit: Payer: Self-pay | Admitting: Internal Medicine

## 2016-09-14 ENCOUNTER — Encounter: Payer: Self-pay | Admitting: Internal Medicine

## 2016-09-14 ENCOUNTER — Ambulatory Visit (INDEPENDENT_AMBULATORY_CARE_PROVIDER_SITE_OTHER): Payer: Self-pay | Admitting: Internal Medicine

## 2016-09-14 VITALS — BP 110/72 | HR 74 | Resp 12 | Ht 76.0 in

## 2016-09-14 DIAGNOSIS — E119 Type 2 diabetes mellitus without complications: Secondary | ICD-10-CM

## 2016-09-14 DIAGNOSIS — B351 Tinea unguium: Secondary | ICD-10-CM

## 2016-09-14 DIAGNOSIS — E785 Hyperlipidemia, unspecified: Secondary | ICD-10-CM | POA: Insufficient documentation

## 2016-09-14 DIAGNOSIS — R609 Edema, unspecified: Secondary | ICD-10-CM

## 2016-09-14 DIAGNOSIS — B354 Tinea corporis: Secondary | ICD-10-CM

## 2016-09-14 DIAGNOSIS — Z23 Encounter for immunization: Secondary | ICD-10-CM

## 2016-09-14 DIAGNOSIS — E1165 Type 2 diabetes mellitus with hyperglycemia: Secondary | ICD-10-CM

## 2016-09-14 DIAGNOSIS — Z794 Long term (current) use of insulin: Secondary | ICD-10-CM

## 2016-09-14 LAB — GLUCOSE, POCT (MANUAL RESULT ENTRY): POC Glucose: 147 mg/dl — AB (ref 70–99)

## 2016-09-14 NOTE — Patient Instructions (Signed)
No flip flops or bare feet. Get some good cushioned slippers that do not slap against your foot when walking. Get started on foot care and Terbinafine. May pick up Terbinafine or Lotrimin Cream to apply to abdominal rash twice daily for 14 days, but would really just recommend getting started on Terbinafine pills. Eucerin Cream with Eczema relief after shower each day and apply one other time daily--All over skin

## 2016-09-14 NOTE — Progress Notes (Signed)
   Subjective:    Patient ID: Shawn Benson, male    DOB: 10/08/1980, 35 y.o.   MRN: [id number]  HPI   Here for follow up of much improved labs for DM and cholesterol.   Unfortunately, he once again stopped taking meds 3 weeks ago. Not clear why.  Right foot:  Pressure point on lateral plantar aspect of foot.  Does not tie shoes so rubs.  Walks on hard floors with thin flip flops regularly or goes bare foot.  Has not had flu vaccine.  Has intertriginous infection below abdominal pannus.  Has not started Terbinafine for toenail fungal involvment, but did find the pills.  Smoking MJ again and feels this actually is a treatment for his health issues, stating he smoked for 22 years and then stopped, then developed DM.  Discussed that's likely what led up to him developing DM.  No outpatient prescriptions have been marked as taking for the 09/14/16 encounter (Office Visit) with Julieanne MansonElizabeth Tajai Ihde, MD.    No Known Allergies    Review of Systems     Objective:   Physical Exam  Morbidly obese, NAD HEENT:  PERRL, EOMI Neck:  Supple, no adenopathy, no thyromegaly Chest:  CTA CV:  RRR with normal S1 and S2, No S3, S4, or murmur radial pulses normal and equal. LE:  Pitting edema to knees-mild.  Feet scaly all over with thickened, discolored nails-yellow. Tender over plantar heels bilaterally Skin: diffusely dry, erythematous and scaling under abdominal pannus        Assessment & Plan:  1.  DM:  Needs to get orange card renewed and back on meds.  So disappointing as was doing so well.  This is a second time this year he has improved and then completely stopped treatment. Flu vaccine today  2. LE Edema:  Needs to fill Furosemide.  Unable to measure appropriately his ankles and calves today.  He does have a tape measure at home.  Gave him instruction on measuring legs and order booklet for less expensive compression stockings.  3.  Dyslipidemia:  Went over labs.  Again, not  controlling DM currently, so don't expect this to improve until he does so long term.  Lipids, however, were almost at goal.  Discussed Diet again.  4.  Yeast intertriginous infection of abdominal pannus and toenail onychomycosis:  Needs to get Terbinafine started for 90 days.  May use topical Terbinafine or Lotrimin for skin involvement as well twice daily  5.  Dry skin:  Discussed concerns for developing life threatening infection if does not do better skin care.     6.  Foot concerns:  Development of plantar fasciitis.  Discussed heel cups or cushion.  Good slippers when home and not flip flops.  Plantar/achilles stretches

## 2016-10-20 MED ORDER — FREESTYLE LANCETS MISC: 11 refills | Status: DC

## 2016-10-20 MED ORDER — "INSULIN SYRINGE 31G X 5/16"" 0.5 ML MISC": 1.0000 | Freq: Two times a day (BID) | 11 refills | Status: DC

## 2016-10-20 MED ORDER — METFORMIN HCL 500 MG PO TABS: 500.0000 mg | ORAL_TABLET | Freq: Two times a day (BID) | ORAL | 11 refills | Status: DC

## 2016-10-20 MED ORDER — FUROSEMIDE 20 MG PO TABS: 20.0000 mg | ORAL_TABLET | Freq: Every day | ORAL | 11 refills | Status: DC

## 2016-10-26 ENCOUNTER — Ambulatory Visit: Payer: Self-pay | Admitting: Internal Medicine

## 2016-12-28 ENCOUNTER — Telehealth: Payer: Self-pay

## 2016-12-28 NOTE — Telephone Encounter (Signed)
He needs to decrease his dose gradually.  Looked in his chart and the last dose was 52 units twice daily. He also needs to quit stopping his medication or he will not survive much longer. He also needs to remember he is ultimately responsible for keeping his appointments.

## 2016-12-28 NOTE — Telephone Encounter (Signed)
Patient called stating he missed his last appointment because he never received a appointment reminder call. States he never misses his appointments. Patient stopped his insulin from 10/25/16-12/24/16. States he checked his sugar on Monday and it was 427. He is taking 56 units of insulin twice a day. Tuesday his sugar was 262, wed 208, thurs 189 and this morning 139. Patient feels his sugar level is dropping to fast. Patient would like to know what he needs to do.

## 2016-12-28 NOTE — Telephone Encounter (Signed)
Spoke with patient. Patient scheduled to come in on 01/01/17 @ 8:30 am

## 2017-01-01 ENCOUNTER — Ambulatory Visit (INDEPENDENT_AMBULATORY_CARE_PROVIDER_SITE_OTHER): Payer: Self-pay | Admitting: Internal Medicine

## 2017-01-01 ENCOUNTER — Encounter: Payer: Self-pay | Admitting: Internal Medicine

## 2017-01-01 VITALS — BP 124/80 | HR 64 | Resp 12 | Ht 76.0 in

## 2017-01-01 DIAGNOSIS — B351 Tinea unguium: Secondary | ICD-10-CM

## 2017-01-01 DIAGNOSIS — Z794 Long term (current) use of insulin: Secondary | ICD-10-CM

## 2017-01-01 DIAGNOSIS — R609 Edema, unspecified: Secondary | ICD-10-CM

## 2017-01-01 DIAGNOSIS — E119 Type 2 diabetes mellitus without complications: Secondary | ICD-10-CM

## 2017-01-01 DIAGNOSIS — B353 Tinea pedis: Secondary | ICD-10-CM

## 2017-01-01 LAB — GLUCOSE, POCT (MANUAL RESULT ENTRY): POC Glucose: 156 mg/dl — AB (ref 70–99)

## 2017-01-01 MED ORDER — FUROSEMIDE 20 MG PO TABS: 20.0000 mg | ORAL_TABLET | Freq: Every day | ORAL | 11 refills | Status: DC

## 2017-01-01 NOTE — Progress Notes (Signed)
Subjective:    Patient ID: Shawn Benson, male    DOB: [birth year], 36 y.o.   MRN: [id number]  HPI   1.  DM, Type 2, insulin requiring:  States he finally realizes he has been in denial and needs to start taking better care of himself.  He has started working out more.   Has been lost to care again for some time.  Was not using insulin again from January 18th until March 19th when he restarted.  Currently taking 50 units twice daily of Novolin 70/30.  Back to Metformin as well. Sugars were in upper 400s when started back up  In last 2 days sugars ranging from 144 to 219.    Has cut out sugary drinks, though using Splenda in coffee.    Weighs still more than 350 lb., so unable to get BMI.  He is not sure if he has lost weight or not.  Clothes are not loose.  Peripheral edema:  Needs Furosemide filled.  Swelling of ankles comes and goes.  Has been sleeping in a chair sitting up.   He still does not have a regular sleep/wake cycle.  Snores when really tired.    Lost his puppy beginning of March.    Current Meds  Medication Sig  . Blood Glucose Monitoring Suppl (AGAMATRIX PRESTO) w/Device KIT 1 Device by Does not apply route 2 (two) times daily.  . furosemide (LASIX) 20 MG tablet Take 1 tablet (20 mg total) by mouth daily.  Marland Kitchen glucose blood (AGAMATRIX PRESTO TEST) test strip Use as instructed  . insulin NPH-regular Human (NOVOLIN 70/30) (70-30) 100 UNIT/ML injection Inject 52 Units into the skin 2 (two) times daily with a meal. 52 units subcutaneously twice daily before meals  . Insulin Syringe-Needle U-100 (INSULIN SYRINGE .5CC/31GX5/16") 31G X 5/16" 0.5 ML MISC 1 Device by Does not apply route 2 (two) times daily with a meal.  . Lancets (FREESTYLE) lancets Use as instructed  . metFORMIN (GLUCOPHAGE) 500 MG tablet Take 1 tablet (500 mg total) by mouth 2 (two) times daily with a meal.  . terbinafine (LAMISIL) 250 MG tablet Take 1 tablet (250 mg total) by mouth daily.  . [DISCONTINUED]  furosemide (LASIX) 20 MG tablet Take 1 tablet (20 mg total) by mouth daily.    No Known Allergies    Review of Systems     Objective:   Physical Exam  Smells strongly of cigarette smoke Lungs:  CTA CV:  RRR without murmur or rub, radial pulses normal and equal LE:  Moderated edema.  Legs with flaky dryness throughout.  No erythema.  Thick callusing at edge of heels.  Left plantar surface NT, including heel      Assessment & Plan:  1.  DM, insulin requiring:  Increase Novolin 70/30 to 52 units twice daily before meals.  Keep track of sugars before breakfast and dinner and document.  Bring documentation in to each visit.  Continue Metformin 500 mg twice daily with meals as well. Gradually increase physical activity to 1 hour daily.  Continue improving diet. Check microalbumin/crea ratio, urine.  2.  Peripheral Edema:  Furosemide 20 mg daily in morning.  To avoid sleeping or sitting for prolonged periods with legs dependent.  3.  Onychomycosis of toenails and Tinea pedis, bilateral:  Hepatic transaminases fine in December 2017.  Start Terbinafine 250 mg daily with regular care of shoes and shower floor.  Follow up in 6 and 12 weeks with hepatic profile and visit.  A1C and CMP at the 12 week follow up with me.  4.  Morbid Obesity:

## 2017-01-01 NOTE — Patient Instructions (Signed)
Drink a glass of water before every meal Drink 6-8 glasses of water daily Eat three meals daily Eat a protein and healthy fat with every meal (eggs,fish, chicken, turkey and limit red meats) Eat 5 servings of vegetables daily, mix the colors Eat 2 servings of fruit daily with skin, if skin is edible Use smaller plates Put food/utensils down as you chew and swallow each bite Eat at a table with friends/family at least once daily, no TV Do not eat in front of the TV 

## 2017-01-02 LAB — MICROALBUMIN / CREATININE URINE RATIO
CREATININE, UR: 145.2 mg/dL
Microalb/Creat Ratio: 5.5 mg/g creat (ref 0.0–30.0)
Microalbumin, Urine: 8 ug/mL

## 2017-02-05 ENCOUNTER — Other Ambulatory Visit (INDEPENDENT_AMBULATORY_CARE_PROVIDER_SITE_OTHER): Payer: Self-pay

## 2017-02-05 DIAGNOSIS — E785 Hyperlipidemia, unspecified: Secondary | ICD-10-CM

## 2017-02-05 DIAGNOSIS — Z79899 Other long term (current) drug therapy: Secondary | ICD-10-CM

## 2017-02-06 LAB — LIPID PANEL W/O CHOL/HDL RATIO
CHOLESTEROL TOTAL: 144 mg/dL (ref 100–199)
HDL: 30 mg/dL — ABNORMAL LOW (ref >39)
LDL CALC: 93 mg/dL (ref 0–99)
TRIGLYCERIDES: 103 mg/dL (ref 0–149)
VLDL CHOLESTEROL CAL: 21 mg/dL (ref 5–40)

## 2017-02-06 LAB — HEPATIC FUNCTION PANEL
ALT: 16 IU/L (ref 0–44)
AST: 12 IU/L (ref 0–40)
Albumin: 3.9 g/dL (ref 3.5–5.5)
Alkaline Phosphatase: 87 IU/L (ref 39–117)
Bilirubin Total: <0.2 mg/dL (ref 0.0–1.2)
Bilirubin, Direct: 0.08 mg/dL (ref 0.00–0.40)
TOTAL PROTEIN: 6.8 g/dL (ref 6.0–8.5)

## 2017-03-26 ENCOUNTER — Other Ambulatory Visit (INDEPENDENT_AMBULATORY_CARE_PROVIDER_SITE_OTHER): Payer: Self-pay

## 2017-03-26 DIAGNOSIS — E1165 Type 2 diabetes mellitus with hyperglycemia: Secondary | ICD-10-CM

## 2017-03-26 DIAGNOSIS — Z794 Long term (current) use of insulin: Secondary | ICD-10-CM

## 2017-03-26 DIAGNOSIS — E11628 Type 2 diabetes mellitus with other skin complications: Secondary | ICD-10-CM

## 2017-03-26 DIAGNOSIS — IMO0002 Reserved for concepts with insufficient information to code with codable children: Secondary | ICD-10-CM

## 2017-03-26 DIAGNOSIS — Z79899 Other long term (current) drug therapy: Secondary | ICD-10-CM

## 2017-03-27 LAB — COMPREHENSIVE METABOLIC PANEL
ALBUMIN: 4 g/dL (ref 3.5–5.5)
ALT: 15 IU/L (ref 0–44)
AST: 14 IU/L (ref 0–40)
Albumin/Globulin Ratio: 1.4 (ref 1.2–2.2)
Alkaline Phosphatase: 94 IU/L (ref 39–117)
BUN / CREAT RATIO: 15 (ref 9–20)
BUN: 13 mg/dL (ref 6–20)
Bilirubin Total: 0.3 mg/dL (ref 0.0–1.2)
CALCIUM: 9 mg/dL (ref 8.7–10.2)
CO2: 20 mmol/L (ref 20–29)
CREATININE: 0.85 mg/dL (ref 0.76–1.27)
Chloride: 102 mmol/L (ref 96–106)
GFR, EST AFRICAN AMERICAN: 129 mL/min/{1.73_m2} (ref >59)
GFR, EST NON AFRICAN AMERICAN: 111 mL/min/{1.73_m2} (ref >59)
Globulin, Total: 2.9 g/dL (ref 1.5–4.5)
Glucose: 155 mg/dL — ABNORMAL HIGH (ref 65–99)
Potassium: 4.2 mmol/L (ref 3.5–5.2)
SODIUM: 140 mmol/L (ref 134–144)
TOTAL PROTEIN: 6.9 g/dL (ref 6.0–8.5)

## 2017-03-27 LAB — HGB A1C W/O EAG: Hgb A1c MFr Bld: 8.4 % — ABNORMAL HIGH (ref 4.8–5.6)

## 2017-04-02 ENCOUNTER — Ambulatory Visit: Payer: Self-pay | Admitting: Internal Medicine

## 2017-04-09 ENCOUNTER — Encounter: Payer: Self-pay | Admitting: Internal Medicine

## 2017-04-09 ENCOUNTER — Ambulatory Visit (INDEPENDENT_AMBULATORY_CARE_PROVIDER_SITE_OTHER): Payer: Self-pay | Admitting: Internal Medicine

## 2017-04-09 VITALS — BP 130/70 | HR 80 | Resp 12 | Ht 76.0 in

## 2017-04-09 DIAGNOSIS — R609 Edema, unspecified: Secondary | ICD-10-CM

## 2017-04-09 DIAGNOSIS — R6339 Other feeding difficulties: Secondary | ICD-10-CM

## 2017-04-09 DIAGNOSIS — E785 Hyperlipidemia, unspecified: Secondary | ICD-10-CM

## 2017-04-09 DIAGNOSIS — B351 Tinea unguium: Secondary | ICD-10-CM

## 2017-04-09 DIAGNOSIS — R600 Localized edema: Secondary | ICD-10-CM

## 2017-04-09 DIAGNOSIS — R633 Feeding difficulties: Secondary | ICD-10-CM

## 2017-04-09 DIAGNOSIS — B353 Tinea pedis: Secondary | ICD-10-CM

## 2017-04-09 DIAGNOSIS — Z794 Long term (current) use of insulin: Secondary | ICD-10-CM

## 2017-04-09 DIAGNOSIS — E119 Type 2 diabetes mellitus without complications: Secondary | ICD-10-CM

## 2017-04-09 HISTORY — DX: Localized edema: R60.0

## 2017-04-09 HISTORY — DX: Edema, unspecified: R60.9

## 2017-04-09 LAB — GLUCOSE, POCT (MANUAL RESULT ENTRY): POC Glucose: 181 mg/dl — AB (ref 70–99)

## 2017-04-09 MED ORDER — HYDROCHLOROTHIAZIDE 25 MG PO TABS: 25.0000 mg | ORAL_TABLET | Freq: Every day | ORAL | 11 refills | Status: DC
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
## 2017-04-09 NOTE — Patient Instructions (Addendum)
Stop Furosemide when you start Hydorchlorothiazide. Don't miss meals or medications.  Tea tree oil on its own or mixed with Gold Bond Foot cream at bedtime to feet each day--work into callused areas in particular.

## 2017-04-15 ENCOUNTER — Telehealth: Payer: Self-pay | Admitting: Licensed Clinical Social Worker

## 2017-04-15 NOTE — Telephone Encounter (Signed)
LCSWA lm with [place] in regards to scheduling a counseling appointment. Received referral from Dr. Delrae AlfredMulberry 04/11/17.

## 2017-04-18 ENCOUNTER — Other Ambulatory Visit (INDEPENDENT_AMBULATORY_CARE_PROVIDER_SITE_OTHER): Payer: Self-pay

## 2017-04-18 DIAGNOSIS — Z79899 Other long term (current) drug therapy: Secondary | ICD-10-CM

## 2017-04-19 ENCOUNTER — Other Ambulatory Visit: Payer: Self-pay | Admitting: Licensed Clinical Social Worker

## 2017-04-19 ENCOUNTER — Telehealth: Payer: Self-pay | Admitting: Licensed Clinical Social Worker

## 2017-04-19 LAB — BASIC METABOLIC PANEL
BUN/Creatinine Ratio: 18 (ref 9–20)
BUN: 16 mg/dL (ref 6–20)
CALCIUM: 8.8 mg/dL (ref 8.7–10.2)
CO2: 23 mmol/L (ref 20–29)
Chloride: 103 mmol/L (ref 96–106)
Creatinine, Ser: 0.88 mg/dL (ref 0.76–1.27)
GFR, EST AFRICAN AMERICAN: 127 mL/min/{1.73_m2} (ref >59)
GFR, EST NON AFRICAN AMERICAN: 110 mL/min/{1.73_m2} (ref >59)
Glucose: 218 mg/dL — ABNORMAL HIGH (ref 65–99)
POTASSIUM: 3.7 mmol/L (ref 3.5–5.2)
SODIUM: 141 mmol/L (ref 134–144)

## 2017-04-19 NOTE — Telephone Encounter (Signed)
LCSWA called and lm with Marik's mom to call LCSWA back regarding missed appointment.

## 2017-07-18 ENCOUNTER — Other Ambulatory Visit: Payer: Self-pay

## 2017-07-22 ENCOUNTER — Other Ambulatory Visit (INDEPENDENT_AMBULATORY_CARE_PROVIDER_SITE_OTHER): Payer: Self-pay | Admitting: Internal Medicine

## 2017-07-22 VITALS — BP 124/80 | HR 70 | Resp 12 | Ht 76.0 in | Wt 340.0 lb

## 2017-07-22 DIAGNOSIS — Z79899 Other long term (current) drug therapy: Secondary | ICD-10-CM

## 2017-07-22 DIAGNOSIS — B370 Candidal stomatitis: Secondary | ICD-10-CM

## 2017-07-22 DIAGNOSIS — Z794 Long term (current) use of insulin: Secondary | ICD-10-CM

## 2017-07-22 DIAGNOSIS — E119 Type 2 diabetes mellitus without complications: Secondary | ICD-10-CM

## 2017-07-22 DIAGNOSIS — E785 Hyperlipidemia, unspecified: Secondary | ICD-10-CM

## 2017-07-22 DIAGNOSIS — E1110 Type 2 diabetes mellitus with ketoacidosis without coma: Secondary | ICD-10-CM

## 2017-07-22 LAB — POCT URINALYSIS DIPSTICK
BILIRUBIN UA: NEGATIVE
Blood, UA: NEGATIVE
GLUCOSE UA: 2000
Leukocytes, UA: NEGATIVE
Nitrite, UA: NEGATIVE
Protein, UA: 15
SPEC GRAV UA: 1.02 (ref 1.010–1.025)
UROBILINOGEN UA: 0.2 U/dL
pH, UA: 5 (ref 5.0–8.0)

## 2017-07-22 LAB — GLUCOSE, POCT (MANUAL RESULT ENTRY): POC Glucose: 569 mg/dl — AB (ref 70–99)

## 2017-07-22 MED ORDER — FLUCONAZOLE 150 MG PO TABS: ORAL_TABLET | ORAL | 0 refills | Status: DC

## 2017-07-22 NOTE — Patient Instructions (Addendum)
Go to the ED if you are unable to eat/vomiting or diarrhea starts back up Drink a lot of water all day long. Give yourself your insulin twice daily. Stop Metformin for now. Stop hydrochlorothiazide for now I will see you tomorrow
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
## 2017-07-23 ENCOUNTER — Ambulatory Visit (INDEPENDENT_AMBULATORY_CARE_PROVIDER_SITE_OTHER): Payer: Self-pay | Admitting: Internal Medicine

## 2017-07-23 ENCOUNTER — Encounter: Payer: Self-pay | Admitting: Internal Medicine

## 2017-07-23 VITALS — BP 112/72 | HR 70 | Resp 14 | Ht 76.0 in

## 2017-07-23 DIAGNOSIS — E785 Hyperlipidemia, unspecified: Secondary | ICD-10-CM

## 2017-07-23 DIAGNOSIS — R609 Edema, unspecified: Secondary | ICD-10-CM

## 2017-07-23 DIAGNOSIS — B370 Candidal stomatitis: Secondary | ICD-10-CM

## 2017-07-23 DIAGNOSIS — E119 Type 2 diabetes mellitus without complications: Secondary | ICD-10-CM

## 2017-07-23 DIAGNOSIS — Z794 Long term (current) use of insulin: Secondary | ICD-10-CM

## 2017-07-23 LAB — COMPREHENSIVE METABOLIC PANEL
ALT: 12 IU/L (ref 0–44)
AST: 6 IU/L (ref 0–40)
Albumin/Globulin Ratio: 1.4 (ref 1.2–2.2)
Albumin: 4.2 g/dL (ref 3.5–5.5)
Alkaline Phosphatase: 134 IU/L — ABNORMAL HIGH (ref 39–117)
BUN/Creatinine Ratio: 11 (ref 9–20)
BUN: 12 mg/dL (ref 6–20)
Bilirubin Total: 0.4 mg/dL (ref 0.0–1.2)
CALCIUM: 9.4 mg/dL (ref 8.7–10.2)
CHLORIDE: 95 mmol/L — AB (ref 96–106)
CO2: 18 mmol/L — ABNORMAL LOW (ref 20–29)
CREATININE: 1.06 mg/dL (ref 0.76–1.27)
GFR, EST AFRICAN AMERICAN: 102 mL/min/{1.73_m2} (ref >59)
GFR, EST NON AFRICAN AMERICAN: 89 mL/min/{1.73_m2} (ref >59)
GLUCOSE: 611 mg/dL — AB (ref 65–99)
Globulin, Total: 2.9 g/dL (ref 1.5–4.5)
POTASSIUM: 4.6 mmol/L (ref 3.5–5.2)
Sodium: 139 mmol/L (ref 134–144)
TOTAL PROTEIN: 7.1 g/dL (ref 6.0–8.5)

## 2017-07-23 LAB — CBC WITH DIFFERENTIAL/PLATELET
BASOS ABS: 0 10*3/uL (ref 0.0–0.2)
Basos: 0 %
EOS (ABSOLUTE): 0.4 10*3/uL (ref 0.0–0.4)
Eos: 4 %
HEMATOCRIT: 46.3 % (ref 37.5–51.0)
Hemoglobin: 14.9 g/dL (ref 13.0–17.7)
Immature Grans (Abs): 0 10*3/uL (ref 0.0–0.1)
Immature Granulocytes: 0 %
Lymphocytes Absolute: 2 10*3/uL (ref 0.7–3.1)
Lymphs: 20 %
MCH: 26.4 pg — AB (ref 26.6–33.0)
MCHC: 32.2 g/dL (ref 31.5–35.7)
MCV: 82 fL (ref 79–97)
MONOS ABS: 0.8 10*3/uL (ref 0.1–0.9)
Monocytes: 7 %
NEUTROS ABS: 7.2 10*3/uL — AB (ref 1.4–7.0)
Neutrophils: 69 %
Platelets: 259 10*3/uL (ref 150–379)
RBC: 5.64 x10E6/uL (ref 4.14–5.80)
RDW: 15.6 % — AB (ref 12.3–15.4)
WBC: 10.3 10*3/uL (ref 3.4–10.8)

## 2017-07-23 LAB — LIPID PANEL W/O CHOL/HDL RATIO
Cholesterol, Total: 177 mg/dL (ref 100–199)
HDL: 26 mg/dL — ABNORMAL LOW (ref >39)
LDL CALC: 111 mg/dL — AB (ref 0–99)
Triglycerides: 200 mg/dL — ABNORMAL HIGH (ref 0–149)
VLDL CHOLESTEROL CAL: 40 mg/dL (ref 5–40)

## 2017-07-23 LAB — HGB A1C W/O EAG

## 2017-07-23 LAB — GLUCOSE, POCT (MANUAL RESULT ENTRY): POC Glucose: 297 mg/dl — AB (ref 70–99)

## 2017-07-23 MED ORDER — ATORVASTATIN CALCIUM 20 MG PO TABS: ORAL_TABLET | ORAL | 11 refills | Status: DC

## 2017-07-23 NOTE — Patient Instructions (Addendum)
Restart Metformin tonight. Restart HCTZ on Thursday morning Once you get your orange card, go to [facility] for Lipitor 20 mg daily. Return for flu vaccine on Thursday.

## 2017-07-23 NOTE — Progress Notes (Signed)
Subjective:    Patient ID: Shawn Benson, male    DOB: 01-Mar-1981, 36 y.o.   MRN: [id number]  HPI   1.  DM:  Gave himself both injections of Novolin mix yesterday.  Has been drinking water.  Feeling better today.  Still urinating a lot.  Sugars down into high 200s, to high 300s now.  Was in 500-600 range.  Metformin was on hold until able to see his renal status, which is fine.  CBC fine.  Bicarb was surprisingly close to normal at 18.  K+ was fine as well.  2.  Hyperlipidemia:  Not at goal.   Not taking anything for cholesterol currently. Lipid Panel     Component Value Date/Time   CHOL 177 07/22/2017 1047   TRIG 200 (H) 07/22/2017 1047   HDL 26 (L) 07/22/2017 1047   CHOLHDL 3.9 09/11/2016 1000   LDLCALC 111 (H) 07/22/2017 1047    3.  LE edema:  Minimal swelling of ankles after holding HCTZ for 24 hours.  Current Meds  Medication Sig  . Blood Glucose Monitoring Suppl (AGAMATRIX PRESTO) w/Device KIT 1 Device by Does not apply route 2 (two) times daily.  Marland Kitchen glucose blood (AGAMATRIX PRESTO TEST) test strip Use as instructed  . insulin NPH-regular Human (NOVOLIN 70/30) (70-30) 100 UNIT/ML injection Inject 52 Units into the skin 2 (two) times daily with a meal. 52 units subcutaneously twice daily before meals  . Insulin Syringe-Needle U-100 (INSULIN SYRINGE .5CC/31GX5/16") 31G X 5/16" 0.5 ML MISC 1 Device by Does not apply route 2 (two) times daily with a meal.  . Lancets (FREESTYLE) lancets Use as instructed   No Known Allergies    Review of Systems     Objective:   Physical Exam Looks much better. HEENT: tongue now with adherent white exudate and underlying inflammation. Lungs:  CTA CV: RRR without murmur or rub,  Radial pulses normal and equal LE:  Mild edema.       Assessment & Plan:  1.  DM:  Much improved.  No odor of ketones today and color looks normal.  Sugars down substantially. A1C pending. He would like to wait to get influenza vaccine in 2 days when  here for orange card.   To continue with insulin and restart Metformin.    2. Oral Thrush:  Needs to go pick up Fluconazole at [facility].  3.  Dyslipidemia:  Start Lipitor 20 mg daily at [facility].  FLP/hepatic profile in 6 weeks.  4.  Peripheral Edema:  To restart HCTZ on Thursday morning.

## 2017-07-23 NOTE — Progress Notes (Signed)
LCSW completed warm hand-off with Dr Delrae Alfred. LCSW used Motivation Interviewing to assess barriers to change and motivation to change. LCSW and [place] discussed his recent health issues, particularly around not taking his medications for diabetes. He shared that he feels fine physically but is under a lot of pressure from his job. He shared that his only motivation to take care of his health is wanting to stay out of the hospital and fear of death. He reported that he is at an 8 out of 10 for motivation but was unable to identify any specific behavior changes that he will make to take care of his health. [place] shared that he didn't want to do counseling in the past but is willing to give it a try now. Scheduled for next week.

## 2017-07-24 LAB — HGB A1C W/O EAG

## 2017-07-24 LAB — SPECIMEN STATUS REPORT

## 2017-07-25 ENCOUNTER — Other Ambulatory Visit: Payer: Self-pay

## 2017-07-26 ENCOUNTER — Telehealth: Payer: Self-pay | Admitting: Internal Medicine

## 2017-07-26 NOTE — Telephone Encounter (Signed)
To Dr. Mulberry FYI 

## 2017-07-26 NOTE — Telephone Encounter (Signed)
Patient called just to let Dr. Delrae AlfredMulberry knows his blood sugar is running today at 259 and that he is feeling good and is  doing everything that is supposed to do.  To Cherice; FYI

## 2017-07-27 NOTE — Telephone Encounter (Signed)
Good news.

## 2017-07-30 ENCOUNTER — Other Ambulatory Visit: Payer: Self-pay | Admitting: Licensed Clinical Social Worker

## 2017-09-03 ENCOUNTER — Other Ambulatory Visit: Payer: Self-pay

## 2018-03-01 ENCOUNTER — Other Ambulatory Visit: Payer: Self-pay | Admitting: Internal Medicine

## 2018-03-01 DIAGNOSIS — E1165 Type 2 diabetes mellitus with hyperglycemia: Secondary | ICD-10-CM

## 2018-03-01 DIAGNOSIS — R609 Edema, unspecified: Secondary | ICD-10-CM

## 2018-03-06 ENCOUNTER — Other Ambulatory Visit: Payer: Self-pay

## 2018-03-06 ENCOUNTER — Encounter (HOSPITAL_COMMUNITY): Payer: Self-pay

## 2018-03-06 ENCOUNTER — Emergency Department (HOSPITAL_COMMUNITY)
Admission: EM | Admit: 2018-03-06 | Discharge: 2018-03-06 | Disposition: A | Discharge status: Home / Self Care | Payer: Self-pay | Attending: Emergency Medicine | Admitting: Emergency Medicine

## 2018-03-06 DIAGNOSIS — E1165 Type 2 diabetes mellitus with hyperglycemia: Secondary | ICD-10-CM | POA: Insufficient documentation

## 2018-03-06 DIAGNOSIS — J45909 Unspecified asthma, uncomplicated: Secondary | ICD-10-CM | POA: Insufficient documentation

## 2018-03-06 DIAGNOSIS — L02415 Cutaneous abscess of right lower limb: Secondary | ICD-10-CM | POA: Insufficient documentation

## 2018-03-06 DIAGNOSIS — Z794 Long term (current) use of insulin: Secondary | ICD-10-CM | POA: Insufficient documentation

## 2018-03-06 DIAGNOSIS — F17210 Nicotine dependence, cigarettes, uncomplicated: Secondary | ICD-10-CM | POA: Insufficient documentation

## 2018-03-06 DIAGNOSIS — Z79899 Other long term (current) drug therapy: Secondary | ICD-10-CM | POA: Insufficient documentation

## 2018-03-06 DIAGNOSIS — R739 Hyperglycemia, unspecified: Secondary | ICD-10-CM

## 2018-03-06 LAB — COMPREHENSIVE METABOLIC PANEL
ALBUMIN: 3.8 g/dL (ref 3.5–5.0)
ALT: 20 U/L (ref 17–63)
AST: 18 U/L (ref 15–41)
Alkaline Phosphatase: 89 U/L (ref 38–126)
Anion gap: 10 (ref 5–15)
BUN: 9 mg/dL (ref 6–20)
CHLORIDE: 99 mmol/L — AB (ref 101–111)
CO2: 27 mmol/L (ref 22–32)
CREATININE: 0.84 mg/dL (ref 0.61–1.24)
Calcium: 8.9 mg/dL (ref 8.9–10.3)
GFR calc Af Amer: >60 mL/min (ref >60)
GFR calc non Af Amer: >60 mL/min (ref >60)
Glucose, Bld: 552 mg/dL (ref 65–99)
Potassium: 3.6 mmol/L (ref 3.5–5.1)
SODIUM: 136 mmol/L (ref 135–145)
Total Bilirubin: 0.4 mg/dL (ref 0.3–1.2)
Total Protein: 6.9 g/dL (ref 6.5–8.1)

## 2018-03-06 LAB — CBC WITH DIFFERENTIAL/PLATELET
Basophils Absolute: 0 10*3/uL (ref 0.0–0.1)
Basophils Relative: 0 %
EOS ABS: 0.3 10*3/uL (ref 0.0–0.7)
EOS PCT: 3 %
HCT: 40.9 % (ref 39.0–52.0)
Hemoglobin: 13.5 g/dL (ref 13.0–17.0)
LYMPHS PCT: 28 %
Lymphs Abs: 2.4 10*3/uL (ref 0.7–4.0)
MCH: 26.1 pg (ref 26.0–34.0)
MCHC: 33 g/dL (ref 30.0–36.0)
MCV: 79 fL (ref 78.0–100.0)
Monocytes Absolute: 0.7 10*3/uL (ref 0.1–1.0)
Monocytes Relative: 8 %
Neutro Abs: 5.3 10*3/uL (ref 1.7–7.7)
Neutrophils Relative %: 61 %
PLATELETS: 207 10*3/uL (ref 150–400)
RBC: 5.18 MIL/uL (ref 4.22–5.81)
RDW: 14.9 % (ref 11.5–15.5)
WBC: 8.7 10*3/uL (ref 4.0–10.5)

## 2018-03-06 LAB — CBG MONITORING, ED
GLUCOSE-CAPILLARY: 519 mg/dL — AB (ref 65–99)
Glucose-Capillary: 376 mg/dL — ABNORMAL HIGH (ref 65–99)

## 2018-03-06 MED ORDER — INSULIN ASPART PROT & ASPART (70-30 MIX) 100 UNIT/ML ~~LOC~~ SUSP
52.0000 [IU] | Freq: Once | SUBCUTANEOUS | Status: AC
  Administered 2018-03-06: 52 [IU] via SUBCUTANEOUS
  Filled 2018-03-06: qty 10

## 2018-03-06 MED ORDER — SODIUM CHLORIDE 0.9 % IV BOLUS
1000.0000 mL | Freq: Once | INTRAVENOUS | Status: AC
  Administered 2018-03-06: 1000 mL via INTRAVENOUS

## 2018-03-06 MED ORDER — LIDOCAINE-EPINEPHRINE (PF) 2 %-1:200000 IJ SOLN
10.0000 mL | Freq: Once | INTRAMUSCULAR | Status: AC
  Administered 2018-03-06: 10 mL
  Filled 2018-03-06: qty 20

## 2018-03-06 MED ORDER — IBUPROFEN 600 MG PO TABS: 600.0000 mg | ORAL_TABLET | Freq: Four times a day (QID) | ORAL | 0 refills | Status: DC | PRN

## 2018-03-06 MED ORDER — CEPHALEXIN 500 MG PO CAPS: 500.0000 mg | ORAL_CAPSULE | Freq: Four times a day (QID) | ORAL | 0 refills | Status: DC

## 2018-03-06 NOTE — ED Provider Notes (Signed)
[facility] DEPT Provider Note   CSN: [id number] Arrival date & time: 03/06/18  0057     History   Chief Complaint No chief complaint on file.   HPI Shawn Benson is a 37 y.o. male.  Patient presents with complaint of abscess to right proximal posterior thigh that started several days ago and has drained on and off since. No fever. He reports his blood sugar has been elevated as well. He is noncompliant with his insulin. He states he started "feeling bad" last week, urinating frequently, and started using his insulin at that time. Prior to that he had not used insulin in several months. He was taking his usual 52 units in am and in pm until yesterday when he took the morning dose but not the evening dose. CBG on arrival was 519. No nausea or vomiting.   The history is provided by the patient. No language interpreter was used.    Past Medical History:  Diagnosis Date  . Allergy    Seasonal--spring through fall.  . Asthma childhood and adult   triggers are allergies and URI.  Spring, summer and fall.  . Diabetes mellitus without complication ([place])   . Morbid obesity ([place])   . Peripheral edema 04/09/2017    Patient Active Problem List   Diagnosis Date Noted  . Peripheral edema 04/09/2017  . Dyslipidemia 09/14/2016  . Sleep apnea 07/10/2016  . Tinea corporis 07/10/2016  . Tinea pedis 07/10/2016  . Abscess of axilla, left 05/11/2016  . Onychomycosis of toenail 12/06/2015  . Type 2 diabetes mellitus without complication, with long-term current use of insulin ([place]) 10/07/2015  . Asthma 10/06/2015  . Morbid obesity due to excess calories [facility])     Past Surgical History:  Procedure Laterality Date  . INCISION AND DRAINAGE PERIRECTAL ABSCESS N/A 10/06/2015   Procedure: IRRIGATION AND DEBRIDEMENT PERIANAL  ABSCESS;  Surgeon: Alphonsa Overall, MD;  Location: [facility] ORS;  Service: General;  Laterality: N/A;  . KNEE SURGERY     Right and Left Knee         Home Medications    Prior to Admission medications   Medication Sig Start Date End Date Taking? Authorizing Provider  Blood Glucose Monitoring Suppl (AGAMATRIX PRESTO) w/Device KIT 1 Device by Does not apply route 2 (two) times daily. 03/30/16  Yes Mack Hook, MD  cetirizine (ZYRTEC) 10 MG tablet Take 10 mg by mouth daily.   Yes [provider]  fluconazole (DIFLUCAN) 150 MG tablet 1 tab by mouth daily for 10 days Patient taking differently: Take 150 mg by mouth once.  07/22/17  Yes Mack Hook, MD  furosemide (LASIX) 20 MG tablet TAKE 1 TABLET BY MOUTH ONCE DAILY 03/03/18  Yes Mack Hook, MD  glucose blood (AGAMATRIX PRESTO TEST) test strip Use as instructed 03/30/16  Yes Mack Hook, MD  hydrochlorothiazide (HYDRODIURIL) 25 MG tablet Take 1 tablet (25 mg total) by mouth daily. 04/09/17  Yes Mack Hook, MD  insulin NPH-regular Human (NOVOLIN 70/30) (70-30) 100 UNIT/ML injection Inject 52 Units into the skin 2 (two) times daily with a meal. 52 units subcutaneously twice daily before meals 07/10/16  Yes Mack Hook, MD  Insulin Syringe-Needle U-100 (INSULIN SYRINGE .5CC/31GX5/16") 31G X 5/16" 0.5 ML MISC 1 Device by Does not apply route 2 (two) times daily with a meal. 10/20/16  Yes Mack Hook, MD  Lancets (FREESTYLE) lancets Use as instructed 10/20/16  Yes Mack Hook, MD  metFORMIN (GLUCOPHAGE) 500 MG tablet TAKE ONE  TABLET BY MOUTH TWICE DAILY WITH A MEAL 03/03/18  Yes Mack Hook, MD  atorvastatin (LIPITOR) 20 MG tablet 1 tab by mouth daily with dinner Patient not taking: Reported on 03/06/2018 07/23/17   Mack Hook, MD    Family History Family History  Problem Relation Age of Onset  . Diabetes Mother   . Diabetes Father   . Diabetes Brother   . Diabetes Brother     Social History Social History   Tobacco Use  . Smoking status: Current Every Day Smoker    Packs/day: 1.00    Types:  Cigarettes    Last attempt to quit: 09/05/2015    Years since quitting: 2.5  . Smokeless tobacco: Never Used  . Tobacco comment: 5 a day  Substance Use Topics  . Alcohol use: Yes    Alcohol/week: 0.0 oz    Comment: occasional beer  . Drug use: Yes    Types: Marijuana    Comment: occasonally     Allergies   Patient has no known allergies.   Review of Systems Review of Systems  Constitutional: Negative for chills and fever.  Respiratory: Negative.   Cardiovascular: Negative.   Gastrointestinal: Negative.   Endocrine: Positive for polydipsia and polyuria.  Musculoskeletal: Negative.   Skin: Positive for wound.       See HPI.  Neurological: Negative.      Physical Exam Updated Vital Signs BP 124/69 (BP Location: Left Arm)   Pulse 76   Temp 98.7 F (37.1 C) (Oral)   Resp 18   SpO2 98%   Physical Exam  Constitutional: He is oriented to person, place, and time. He appears well-developed and well-nourished.  HENT:  Head: Normocephalic.  Mouth/Throat: Oropharynx is clear and moist.  Neck: Normal range of motion. Neck supple.  Cardiovascular: Normal rate and regular rhythm.  No murmur heard. Pulmonary/Chest: Effort normal and breath sounds normal. He has no wheezes. He has no rales.  Abdominal: Soft. Bowel sounds are normal. There is no tenderness. There is no rebound and no guarding.  Genitourinary:  Genitourinary Comments: External hemorrhoid present, non-thrombosed.  Musculoskeletal: Normal range of motion.  Neurological: He is alert and oriented to person, place, and time.  Skin: Skin is warm and dry. No rash noted.  Indurated area of redness to proximal posterior right thigh c/w abscess. No active drainage.   Psychiatric: He has a normal mood and affect.     ED Treatments / Results  Labs (all labs ordered are listed, but only abnormal results are displayed) Labs Reviewed  COMPREHENSIVE METABOLIC PANEL - Abnormal; Notable for the following components:       Result Value   Chloride 99 (*)    Glucose, Bld 552 (*)    All other components within normal limits  CBG MONITORING, ED - Abnormal; Notable for the following components:   Glucose-Capillary 519 (*)    All other components within normal limits  CBC WITH DIFFERENTIAL/PLATELET    EKG None  Radiology No results found.  Procedures .Marland KitchenIncision and Drainage Date/Time: 03/06/2018 4:57 AM Performed by: Charlann Lange, PA-C Authorized by: Charlann Lange, PA-C   Consent:    Consent obtained:  Verbal   Consent given by:  Patient Location:    Type:  Abscess   Location:  Lower extremity   Lower extremity location:  Leg   Leg location:  R upper leg Pre-procedure details:    Skin preparation:  Betadine Anesthesia (see MAR for exact dosages):    Anesthesia method:  Local infiltration   Local anesthetic:  Lidocaine 2% WITH epi Procedure type:    Complexity:  Simple Procedure details:    Needle aspiration: no     Incision types:  Single straight   Incision depth:  Subcutaneous   Scalpel blade:  11   Wound management:  Probed and deloculated and irrigated with saline   Drainage:  Bloody and purulent   Drainage amount:  Moderate   Packing materials:  1/4 in iodoform gauze Post-procedure details:    Patient tolerance of procedure:  Tolerated well, no immediate complications   (including critical care time)  Medications Ordered in ED Medications  lidocaine-EPINEPHrine (XYLOCAINE W/EPI) 2 %-1:200000 (PF) injection 10 mL (has no administration in time range)  insulin aspart protamine- aspart (NOVOLOG MIX 70/30) injection 52 Units (has no administration in time range)  sodium chloride 0.9 % bolus 1,000 mL (1,000 mLs Intravenous New Bag/Given 03/06/18 9471)     Initial Impression / Assessment and Plan / ED Course  I have reviewed the triage vital signs and the nursing notes.  Pertinent labs & imaging results that were available during my care of the patient were reviewed by me and  considered in my medical decision making (see chart for details).     Patient presents for evaluation of abscess to right thigh. This area has been I&D'd.  Blood sugar was high without evidence of DKA. The patient admits to being noncompliant with his medications. He was given his evening dose of 52 units 70/30 insulin and blood sugar is trending downward.   The patient is felt stable for discharge home and close PCP follow up.   Final Clinical Impressions(s) / ED Diagnoses   Final diagnoses:  None   1. Cutaneous abscess 2. Hyperglycemia   ED Discharge Orders    None       Charlann Lange, PA-C 03/06/18 2527    Orpah Greek, MD 03/06/18 [id number]

## 2018-03-06 NOTE — ED Notes (Signed)
Bed: WTR6 Expected date:  Expected time:  Means of arrival:  Comments: 

## 2018-03-06 NOTE — Discharge Instructions (Signed)
It is important for you to take your medications as prescribed. Follow up with your doctor in 2 days to have the packing removed and abscess area rechecked.

## 2018-03-07 ENCOUNTER — Ambulatory Visit: Payer: Self-pay | Admitting: Internal Medicine

## 2018-03-07 ENCOUNTER — Encounter: Payer: Self-pay | Admitting: Internal Medicine

## 2018-03-07 VITALS — BP 118/72 | HR 80 | Resp 14 | Ht 76.0 in

## 2018-03-07 DIAGNOSIS — Z794 Long term (current) use of insulin: Secondary | ICD-10-CM

## 2018-03-07 DIAGNOSIS — L02415 Cutaneous abscess of right lower limb: Secondary | ICD-10-CM

## 2018-03-07 DIAGNOSIS — R609 Edema, unspecified: Secondary | ICD-10-CM

## 2018-03-07 DIAGNOSIS — E119 Type 2 diabetes mellitus without complications: Secondary | ICD-10-CM

## 2018-03-07 LAB — GLUCOSE, POCT (MANUAL RESULT ENTRY): POC Glucose: 425 mg/dl — AB (ref 70–99)

## 2018-03-07 MED ORDER — CEPHALEXIN 500 MG PO CAPS: 500.0000 mg | ORAL_CAPSULE | Freq: Four times a day (QID) | ORAL | 0 refills | Status: AC

## 2018-03-07 MED ORDER — IBUPROFEN 600 MG PO TABS: 600.0000 mg | ORAL_TABLET | Freq: Four times a day (QID) | ORAL | 0 refills | Status: DC | PRN

## 2018-03-07 NOTE — Patient Instructions (Signed)
Twice daily soaks in warm water for 20 minutes and replace dressing.

## 2018-03-07 NOTE — Progress Notes (Signed)
   Subjective:    Patient ID: Shawn Benson, male    DOB: 1980-10-23, 37 y.o.   MRN: [id number]  HPI   1.  Left posterior thigh abscess:  Noted bump on posterior thigh when taking a shower.    2.  DM:  As above, was not feeling well and called in for refill of insulin.  Started his insulin, NOvoling 70/30 about 1 week ago, 52 units twice daily. Just filled Metformin and HCTZ yesterday after seen in ED yesterday.  He actually went in for poorly controlled DM and the abscess was noted. He has not started the Cephalexin  4 times daily for 7 days as prescribed yesterday.    Long discussion regarding his neglect of his health.  He states he has probably been off his meds since last December.   This is a recurrent issue.     Current Meds  Medication Sig  . Blood Glucose Monitoring Suppl (AGAMATRIX PRESTO) w/Device KIT 1 Device by Does not apply route 2 (two) times daily.  . furosemide (LASIX) 20 MG tablet TAKE 1 TABLET BY MOUTH ONCE DAILY  . glucose blood (AGAMATRIX PRESTO TEST) test strip Use as instructed  . insulin NPH-regular Human (NOVOLIN 70/30) (70-30) 100 UNIT/ML injection Inject 52 Units into the skin 2 (two) times daily with a meal. 52 units subcutaneously twice daily before meals  . Insulin Syringe-Needle U-100 (INSULIN SYRINGE .5CC/31GX5/16") 31G X 5/16" 0.5 ML MISC 1 Device by Does not apply route 2 (two) times daily with a meal.  . Lancets (FREESTYLE) lancets Use as instructed  . metFORMIN (GLUCOPHAGE) 500 MG tablet TAKE ONE TABLET BY MOUTH TWICE DAILY WITH A MEAL    No Known Allergies  Review of Systems     Objective:   Physical Exam NAD Right inner thigh with 4 cm by 3 cm deep induration with wick coated with yellow pustular drainage.  Minimal erythema.  Wick removed and about 0.5 cm opening.  Wick replaced and bandage reapplied.  LE:  Edema bilaterally of ankles and feet.     Assessment & Plan:  1.  Noncompliance:  Another long discussion regarding treatment of  his chronic health issues to prevent complications.  He states he will take his meds and follow up more diligently. We shall see.  2.  DM:  Back on insulin and meds for now.  3.  Right inner thigh abscess:  To pick up antibiotics now and get started.  Warm soaks in tub twice daily.  Okay if wick falls out.  To follow up beginning next week.  4.  LE edema:  Back

## 2018-03-11 ENCOUNTER — Ambulatory Visit: Payer: Self-pay | Admitting: Internal Medicine

## 2018-06-28 ENCOUNTER — Encounter (HOSPITAL_COMMUNITY): Payer: Self-pay | Admitting: *Deleted

## 2018-06-28 ENCOUNTER — Emergency Department (HOSPITAL_COMMUNITY)
Admission: EM | Admit: 2018-06-28 | Discharge: 2018-06-28 | Disposition: A | Discharge status: Home / Self Care | Payer: Self-pay | Attending: Emergency Medicine | Admitting: Emergency Medicine

## 2018-06-28 DIAGNOSIS — E119 Type 2 diabetes mellitus without complications: Secondary | ICD-10-CM | POA: Insufficient documentation

## 2018-06-28 DIAGNOSIS — Z794 Long term (current) use of insulin: Secondary | ICD-10-CM | POA: Insufficient documentation

## 2018-06-28 DIAGNOSIS — K0889 Other specified disorders of teeth and supporting structures: Secondary | ICD-10-CM | POA: Insufficient documentation

## 2018-06-28 DIAGNOSIS — F17210 Nicotine dependence, cigarettes, uncomplicated: Secondary | ICD-10-CM | POA: Insufficient documentation

## 2018-06-28 DIAGNOSIS — J45909 Unspecified asthma, uncomplicated: Secondary | ICD-10-CM | POA: Insufficient documentation

## 2018-06-28 DIAGNOSIS — Z79899 Other long term (current) drug therapy: Secondary | ICD-10-CM | POA: Insufficient documentation

## 2018-06-28 MED ORDER — TRAMADOL HCL 50 MG PO TABS: 50.0000 mg | ORAL_TABLET | Freq: Four times a day (QID) | ORAL | 0 refills | Status: DC | PRN

## 2018-06-28 MED ORDER — PENICILLIN V POTASSIUM 500 MG PO TABS: 500.0000 mg | ORAL_TABLET | Freq: Four times a day (QID) | ORAL | 0 refills | Status: AC

## 2018-06-28 MED ORDER — TRAMADOL HCL 50 MG PO TABS
50.0000 mg | ORAL_TABLET | Freq: Once | ORAL | Status: AC
  Administered 2018-06-28: 50 mg via ORAL
  Filled 2018-06-28: qty 1

## 2018-06-28 NOTE — Discharge Instructions (Signed)
Follow up with a dentist this week.

## 2018-06-28 NOTE — ED Provider Notes (Signed)
[facility] DEPT Provider Note   CSN: [id number] Arrival date & time: 06/28/18  3810     History   Chief Complaint Chief Complaint  Patient presents with  . Facial Pain  . Dental Pain    HPI Shawn Benson is a 37 y.o. male.  Patient complains of toothache on the right upper side of his mouth.  The history is provided by the patient. No language interpreter was used.  Dental Pain   This is a new problem. The current episode started 2 days ago. The problem occurs constantly. The pain is at a severity of 5/10. The pain is moderate. He has tried nothing for the symptoms. The treatment provided moderate relief.    Past Medical History:  Diagnosis Date  . Allergy    Seasonal--spring through fall.  . Asthma childhood and adult   triggers are allergies and URI.  Spring, summer and fall.  . Diabetes mellitus without complication ([place])   . Morbid obesity ([place])   . Peripheral edema 04/09/2017    Patient Active Problem List   Diagnosis Date Noted  . Peripheral edema 04/09/2017  . Dyslipidemia 09/14/2016  . Sleep apnea 07/10/2016  . Tinea corporis 07/10/2016  . Tinea pedis 07/10/2016  . Abscess of axilla, left 05/11/2016  . Onychomycosis of toenail 12/06/2015  . Type 2 diabetes mellitus without complication, with long-term current use of insulin ([place]) 10/07/2015  . Asthma 10/06/2015  . Morbid obesity due to excess calories [facility])     Past Surgical History:  Procedure Laterality Date  . INCISION AND DRAINAGE PERIRECTAL ABSCESS N/A 10/06/2015   Procedure: IRRIGATION AND DEBRIDEMENT PERIANAL  ABSCESS;  Surgeon: Alphonsa Overall, MD;  Location: [facility];  Service: General;  Laterality: N/A;  . KNEE SURGERY     Right and Left Knee        Home Medications    Prior to Admission medications   Medication Sig Start Date End Date Taking? Authorizing Provider  atorvastatin (LIPITOR) 20 MG tablet 1 tab by mouth daily with dinner Patient not taking:  Reported on 03/06/2018 07/23/17   Mack Hook, MD  Blood Glucose Monitoring Suppl (AGAMATRIX PRESTO) w/Device KIT 1 Device by Does not apply route 2 (two) times daily. 03/30/16   Mack Hook, MD  cetirizine (ZYRTEC) 10 MG tablet Take 10 mg by mouth daily.    [provider]  fluconazole (DIFLUCAN) 150 MG tablet 1 tab by mouth daily for 10 days Patient not taking: Reported on 03/07/2018 07/22/17   Mack Hook, MD  furosemide (LASIX) 20 MG tablet TAKE 1 TABLET BY MOUTH ONCE DAILY 03/03/18   Mack Hook, MD  glucose blood (AGAMATRIX PRESTO TEST) test strip Use as instructed 03/30/16   Mack Hook, MD  hydrochlorothiazide (HYDRODIURIL) 25 MG tablet Take 1 tablet (25 mg total) by mouth daily. Patient not taking: Reported on 03/07/2018 04/09/17   Mack Hook, MD  ibuprofen (ADVIL,MOTRIN) 600 MG tablet Take 1 tablet (600 mg total) by mouth every 6 (six) hours as needed. 03/07/18   Mack Hook, MD  insulin NPH-regular Human (NOVOLIN 70/30) (70-30) 100 UNIT/ML injection Inject 52 Units into the skin 2 (two) times daily with a meal. 52 units subcutaneously twice daily before meals 07/10/16   Mack Hook, MD  Insulin Syringe-Needle U-100 (INSULIN SYRINGE .5CC/31GX5/16") 31G X 5/16" 0.5 ML MISC 1 Device by Does not apply route 2 (two) times daily with a meal. 10/20/16   Mack Hook, MD  Lancets (FREESTYLE) lancets Use as  instructed 10/20/16   Mack Hook, MD  metFORMIN (GLUCOPHAGE) 500 MG tablet TAKE ONE TABLET BY MOUTH TWICE DAILY WITH A MEAL 03/03/18   Mack Hook, MD  penicillin v potassium (VEETID) 500 MG tablet Take 1 tablet (500 mg total) by mouth 4 (four) times daily for 7 days. 06/28/18 07/05/18  Milton Ferguson, MD  traMADol (ULTRAM) 50 MG tablet Take 1 tablet (50 mg total) by mouth every 6 (six) hours as needed. 06/28/18   Milton Ferguson, MD    Family History Family History  Problem Relation Age of Onset  . Diabetes  Mother   . Diabetes Father   . Diabetes Brother   . Diabetes Brother     Social History Social History   Tobacco Use  . Smoking status: Current Every Day Smoker    Packs/day: 1.00    Types: Cigarettes    Last attempt to quit: 09/05/2015    Years since quitting: 2.8  . Smokeless tobacco: Never Used  . Tobacco comment: 5 a day  Substance Use Topics  . Alcohol use: Yes    Alcohol/week: 0.0 standard drinks    Comment: occasional beer  . Drug use: Yes    Types: Marijuana    Comment: occasonally     Allergies   Patient has no known allergies.   Review of Systems Review of Systems  Constitutional: Negative for appetite change and fatigue.  HENT: Negative for congestion, ear discharge and sinus pressure.        Toothache  Eyes: Negative for discharge.  Respiratory: Negative for cough.   Cardiovascular: Negative for chest pain.  Gastrointestinal: Negative for abdominal pain and diarrhea.  Genitourinary: Negative for frequency and hematuria.  Musculoskeletal: Negative for back pain.  Skin: Negative for rash.  Neurological: Negative for seizures and headaches.  Psychiatric/Behavioral: Negative for hallucinations.     Physical Exam Updated Vital Signs BP (!) 146/97 (BP Location: Right Arm)   Pulse 66   Temp 98 F (36.7 C) (Oral)   Resp 18   SpO2 100%   Physical Exam  Constitutional: He is oriented to person, place, and time. He appears well-developed.  HENT:  Head: Normocephalic.  Poor dentition with tenderness to right upper molar  Eyes: Conjunctivae and EOM are normal. No scleral icterus.  Neck: Neck supple. No thyromegaly present.  Cardiovascular: Normal rate and regular rhythm. Exam reveals no gallop and no friction rub.  No murmur heard. Pulmonary/Chest: No stridor. He has no wheezes. He has no rales. He exhibits no tenderness.  Abdominal: He exhibits no distension. There is no tenderness. There is no rebound.  Musculoskeletal: Normal range of motion. He  exhibits no edema.  Lymphadenopathy:    He has no cervical adenopathy.  Neurological: He is oriented to person, place, and time. He exhibits normal muscle tone. Coordination normal.  Skin: No rash noted. No erythema.  Psychiatric: He has a normal mood and affect. His behavior is normal.     ED Treatments / Results  Labs (all labs ordered are listed, but only abnormal results are displayed) Labs Reviewed - No data to display  EKG None  Radiology No results found.  Procedures Procedures (including critical care time)  Medications Ordered in ED Medications  traMADol (ULTRAM) tablet 50 mg (has no administration in time range)     Initial Impression / Assessment and Plan / ED Course  I have reviewed the triage vital signs and the nursing notes.  Pertinent labs & imaging results that were available during my  care of the patient were reviewed by me and considered in my medical decision making (see chart for details).     Patient with abscess tooth.  He will be treated with penicillin and Ultram  Final Clinical Impressions(s) / ED Diagnoses   Final diagnoses:  Pain, dental    ED Discharge Orders         Ordered    penicillin v potassium (VEETID) 500 MG tablet  4 times daily     06/28/18 0736    traMADol (ULTRAM) 50 MG tablet  Every 6 hours PRN     06/28/18 0736           Milton Ferguson, MD 06/28/18 [phone]

## 2018-06-28 NOTE — ED Triage Notes (Signed)
Pt complains of right sided facial pain for the past 2 weeks, worse for the past 2 days. Pt states pain is in his mouth, nasal cavity and forehead. Pt states he has some cavities that need to be taken care of but has been unable.

## 2019-03-15 ENCOUNTER — Other Ambulatory Visit: Payer: Self-pay

## 2019-03-15 ENCOUNTER — Encounter (HOSPITAL_COMMUNITY): Payer: Self-pay | Admitting: Emergency Medicine

## 2019-03-15 ENCOUNTER — Emergency Department (HOSPITAL_COMMUNITY): Payer: Self-pay

## 2019-03-15 ENCOUNTER — Emergency Department (HOSPITAL_COMMUNITY)
Admission: EM | Admit: 2019-03-15 | Discharge: 2019-03-15 | Disposition: A | Discharge status: Home / Self Care | Payer: Self-pay | Attending: Emergency Medicine | Admitting: Emergency Medicine

## 2019-03-15 DIAGNOSIS — Z7984 Long term (current) use of oral hypoglycemic drugs: Secondary | ICD-10-CM | POA: Insufficient documentation

## 2019-03-15 DIAGNOSIS — F17210 Nicotine dependence, cigarettes, uncomplicated: Secondary | ICD-10-CM | POA: Insufficient documentation

## 2019-03-15 DIAGNOSIS — Y929 Unspecified place or not applicable: Secondary | ICD-10-CM | POA: Insufficient documentation

## 2019-03-15 DIAGNOSIS — E119 Type 2 diabetes mellitus without complications: Secondary | ICD-10-CM | POA: Insufficient documentation

## 2019-03-15 DIAGNOSIS — Y939 Activity, unspecified: Secondary | ICD-10-CM | POA: Insufficient documentation

## 2019-03-15 DIAGNOSIS — S91331A Puncture wound without foreign body, right foot, initial encounter: Secondary | ICD-10-CM

## 2019-03-15 DIAGNOSIS — W450XXA Nail entering through skin, initial encounter: Secondary | ICD-10-CM | POA: Insufficient documentation

## 2019-03-15 DIAGNOSIS — Z794 Long term (current) use of insulin: Secondary | ICD-10-CM | POA: Insufficient documentation

## 2019-03-15 DIAGNOSIS — Y999 Unspecified external cause status: Secondary | ICD-10-CM | POA: Insufficient documentation

## 2019-03-15 DIAGNOSIS — Z23 Encounter for immunization: Secondary | ICD-10-CM | POA: Insufficient documentation

## 2019-03-15 MED ORDER — CIPROFLOXACIN HCL 500 MG PO TABS: 500.0000 mg | ORAL_TABLET | Freq: Two times a day (BID) | ORAL | 0 refills | Status: AC

## 2019-03-15 MED ORDER — TETANUS-DIPHTH-ACELL PERTUSSIS 5-2.5-18.5 LF-MCG/0.5 IM SUSP
0.5000 mL | Freq: Once | INTRAMUSCULAR | Status: AC
  Administered 2019-03-15: 0.5 mL via INTRAMUSCULAR
  Filled 2019-03-15: qty 0.5

## 2019-03-15 MED ORDER — CIPROFLOXACIN HCL 500 MG PO TABS
500.0000 mg | ORAL_TABLET | Freq: Once | ORAL | Status: AC
  Administered 2019-03-15: 500 mg via ORAL
  Filled 2019-03-15: qty 1

## 2019-03-15 NOTE — ED Triage Notes (Signed)
Pt reports stepping on a nail on 03/13/2019 while at work and went through his boot. Pt requesting tetanus shot.

## 2019-03-15 NOTE — Discharge Instructions (Signed)
Keep the wound clean the wound with soap and water. Make sure to pat dry the wound before covering it with any dressing. You can use topical antibiotic ointment and bandage. Ice and elevate for pain relief.  ° °You can take Tylenol or Ibuprofen as directed for pain. You can alternate Tylenol and Ibuprofen every 4 hours for additional pain relief.  ° °Take antibiotics as directed. Please take all of your antibiotics until finished. ° °Monitor closely for any signs of infection. Return to the Emergency Department for any worsening redness/swelling of the area that begins to spread, drainage from the site, worsening pain, fever or any other worsening or concerning symptoms.  ° ° °

## 2019-03-15 NOTE — ED Notes (Signed)
Bed: WA06 Expected date:  Expected time:  Means of arrival:  Comments: 

## 2019-03-15 NOTE — ED Provider Notes (Signed)
[facility] DEPT Provider Note   CSN: [id number] Arrival date & time: 03/15/19  2027    History   Chief Complaint Chief Complaint  Patient presents with  . Foot Injury    HPI Shawn Benson is a 38 y.o. male past medical history is of diabetes who presents for evaluation of injury to right foot on 03/13/2019.  Patient reports he works in [employer] and is working on a house.  He states he stepped on a nail that went through his boot and hit to the plantar surface of his foot.  He states that he has been cleaning the wound.  Patient states that he talked to his mother who is concerned with his diabetes and felt like he needed to come in and get evaluated.  He states he does not know when his last tetanus shot was.  He has not noted any surrounding warmth, erythema, drainage from the wound.  He has not had any fevers.  He states that he takes his insulin for his diabetes and has been compliant with his medications.     The history is provided by the patient.    Past Medical History:  Diagnosis Date  . Allergy    Seasonal--spring through fall.  . Asthma childhood and adult   triggers are allergies and URI.  Spring, summer and fall.  . Diabetes mellitus without complication ([place])   . Morbid obesity ([place])   . Peripheral edema 04/09/2017    Patient Active Problem List   Diagnosis Date Noted  . Peripheral edema 04/09/2017  . Dyslipidemia 09/14/2016  . Sleep apnea 07/10/2016  . Tinea corporis 07/10/2016  . Tinea pedis 07/10/2016  . Abscess of axilla, left 05/11/2016  . Onychomycosis of toenail 12/06/2015  . Type 2 diabetes mellitus without complication, with long-term current use of insulin ([place]) 10/07/2015  . Asthma 10/06/2015  . Morbid obesity due to excess calories [facility])     Past Surgical History:  Procedure Laterality Date  . INCISION AND DRAINAGE PERIRECTAL ABSCESS N/A 10/06/2015   Procedure: IRRIGATION AND DEBRIDEMENT PERIANAL  ABSCESS;   Surgeon: Alphonsa Overall, MD;  Location: [facility];  Service: General;  Laterality: N/A;  . KNEE SURGERY     Right and Left Knee        Home Medications    Prior to Admission medications   Medication Sig Start Date End Date Taking? Authorizing Provider  atorvastatin (LIPITOR) 20 MG tablet 1 tab by mouth daily with dinner Patient not taking: Reported on 03/06/2018 07/23/17   Mack Hook, MD  Blood Glucose Monitoring Suppl (AGAMATRIX PRESTO) w/Device KIT 1 Device by Does not apply route 2 (two) times daily. 03/30/16   Mack Hook, MD  cetirizine (ZYRTEC) 10 MG tablet Take 10 mg by mouth daily.    [provider]  ciprofloxacin (CIPRO) 500 MG tablet Take 1 tablet (500 mg total) by mouth 2 (two) times daily for 7 days. 03/15/19 03/22/19  Volanda Napoleon, PA-C  fluconazole (DIFLUCAN) 150 MG tablet 1 tab by mouth daily for 10 days Patient not taking: Reported on 03/07/2018 07/22/17   Mack Hook, MD  furosemide (LASIX) 20 MG tablet TAKE 1 TABLET BY MOUTH ONCE DAILY 03/03/18   Mack Hook, MD  glucose blood (AGAMATRIX PRESTO TEST) test strip Use as instructed 03/30/16   Mack Hook, MD  hydrochlorothiazide (HYDRODIURIL) 25 MG tablet Take 1 tablet (25 mg total) by mouth daily. Patient not taking: Reported on 03/07/2018 04/09/17   Mack Hook,  MD  ibuprofen (ADVIL,MOTRIN) 600 MG tablet Take 1 tablet (600 mg total) by mouth every 6 (six) hours as needed. 03/07/18   Mack Hook, MD  insulin NPH-regular Human (NOVOLIN 70/30) (70-30) 100 UNIT/ML injection Inject 52 Units into the skin 2 (two) times daily with a meal. 52 units subcutaneously twice daily before meals 07/10/16   Mack Hook, MD  Insulin Syringe-Needle U-100 (INSULIN SYRINGE .5CC/31GX5/16") 31G X 5/16" 0.5 ML MISC 1 Device by Does not apply route 2 (two) times daily with a meal. 10/20/16   Mack Hook, MD  Lancets (FREESTYLE) lancets Use as instructed 10/20/16   Mack Hook, MD  metFORMIN (GLUCOPHAGE) 500 MG tablet TAKE ONE TABLET BY MOUTH TWICE DAILY WITH A MEAL 03/03/18   Mack Hook, MD  traMADol (ULTRAM) 50 MG tablet Take 1 tablet (50 mg total) by mouth every 6 (six) hours as needed. 06/28/18   Milton Ferguson, MD    Family History Family History  Problem Relation Age of Onset  . Diabetes Mother   . Diabetes Father   . Diabetes Brother   . Diabetes Brother     Social History Social History   Tobacco Use  . Smoking status: Current Every Day Smoker    Packs/day: 1.00    Types: Cigarettes    Last attempt to quit: 09/05/2015    Years since quitting: 3.5  . Smokeless tobacco: Never Used  . Tobacco comment: 5 a day  Substance Use Topics  . Alcohol use: Yes    Alcohol/week: 0.0 standard drinks    Comment: occasional beer  . Drug use: Yes    Types: Marijuana    Comment: occasonally     Allergies   Patient has no known allergies.   Review of Systems Review of Systems  Constitutional: Negative for fever.  Skin: Positive for wound. Negative for color change.  All other systems reviewed and are negative.    Physical Exam Updated Vital Signs BP 117/76 (BP Location: Right Arm)   Pulse (!) 59   Temp 98.9 F (37.2 C) (Oral)   Resp 16   Ht '6\' 7"'  (2.007 m)   Wt (!) 162.4 kg   SpO2 98%   BMI 40.33 kg/m   Physical Exam Vitals signs and nursing note reviewed.  Constitutional:      Appearance: He is well-developed.  HENT:     Head: Normocephalic and atraumatic.  Eyes:     General: No scleral icterus.       Right eye: No discharge.        Left eye: No discharge.     Conjunctiva/sclera: Conjunctivae normal.  Cardiovascular:     Pulses:          Dorsalis pedis pulses are 2+ on the right side and 2+ on the left side.  Pulmonary:     Effort: Pulmonary effort is normal.  Musculoskeletal:     Comments: Flexion and dorsiflexion of the left foot intact with any difficulty.  No tenderness palpation of the plantar surface.   Skin:    General: Skin is warm and dry.     Capillary Refill: Capillary refill takes less than 2 seconds.     Comments: Small pinpoint wound noted to the medial plantar surface of the right foot.  No surrounding warmth, erythema, edema.  Neurological:     Mental Status: He is alert.     Comments: Sensation intact along major nerve distributions of BLE  Psychiatric:        Speech:  Speech normal.        Behavior: Behavior normal.      ED Treatments / Results  Labs (all labs ordered are listed, but only abnormal results are displayed) Labs Reviewed - No data to display  EKG None  Radiology Dg Foot Complete Right  Result Date: 03/15/2019 CLINICAL DATA:  Initial evaluation for acute foot pain, stepped on nail on 03/13/2019. No acute EXAM: RIGHT FOOT COMPLETE - 3+ VIEW COMPARISON:  None. FINDINGS: Fracture or dislocation. Scattered osteoarthritic changes noted about the midfoot. Joint spaces otherwise maintained without evidence for significant degenerative or erosive arthropathy. No visible soft tissue injury. No radiopaque foreign body. IMPRESSION: 1. No visible soft tissue injury about the foot. No radiopaque foreign body. 2. No acute osseous abnormality. 3. 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 Electronically Signed   By: Jeannine Boga M.D.   On: 03/15/2019 22:25    Procedures Procedures (including critical care time)  Medications Ordered in ED Medications  Tdap (BOOSTRIX) injection 0.5 mL (0.5 mLs Intramuscular Given 03/15/19 2203)  ciprofloxacin (CIPRO) tablet 500 mg (500 mg Oral Given 03/15/19 2239)     Initial Impression / Assessment and Plan / ED Course  I have reviewed the triage vital signs and the nursing notes.  Pertinent labs & imaging results that were available during my care of the patient were reviewed by me and considered in my medical decision making (see chart for details).        38 year old male who presents for evaluation of  right foot injury x2 days ago.  History of diabetes.  No surrounding warmth, erythema.  No history of fevers.  He is compliant with his insulin.  Does not know when his last tetanus shot was. Patient is afebrile, non-toxic appearing, sitting comfortably on examination table. Vital signs reviewed and stable.  Small pinpoint wound noted plantar surface of the right foot.  No surrounding infectious signs.  Will plan for x-ray, Tdap.  Given that it went through his boot, will plan to put him on antibiotics.  Patient with no known drug allergies.  X-rays reviewed.  No evidence of bony abnormality.  No evidence of foreign body.  Discussed results with patient.  Encouraged wound care.  We will plan to discharge home on antibiotics. At this time, patient exhibits no emergent life-threatening condition that require further evaluation in ED or admission. Patient had ample opportunity for questions and discussion. All patient's questions were answered with full understanding. Strict return precautions discussed. Patient expresses understanding and agreement to plan.   Portions of this note were generated with [employer]. Dictation errors may occur despite best attempts at proofreading.   Final Clinical Impressions(s) / ED Diagnoses   Final diagnoses:  Puncture wound of right foot, initial encounter    ED Discharge Orders         Ordered    ciprofloxacin (CIPRO) 500 MG tablet  2 times daily     03/15/19 2232           Volanda Napoleon, PA-C 03/16/19 0055    Little, Wenda Overland, MD 03/16/19 2109

## 2019-03-22 ENCOUNTER — Encounter (HOSPITAL_COMMUNITY): Payer: Self-pay

## 2019-03-22 ENCOUNTER — Emergency Department (HOSPITAL_COMMUNITY)
Admission: EM | Admit: 2019-03-22 | Discharge: 2019-03-22 | Disposition: A | Discharge status: Home / Self Care | Payer: Self-pay | Attending: Emergency Medicine | Admitting: Emergency Medicine

## 2019-03-22 ENCOUNTER — Other Ambulatory Visit: Payer: Self-pay

## 2019-03-22 DIAGNOSIS — F17210 Nicotine dependence, cigarettes, uncomplicated: Secondary | ICD-10-CM | POA: Insufficient documentation

## 2019-03-22 DIAGNOSIS — Z794 Long term (current) use of insulin: Secondary | ICD-10-CM | POA: Insufficient documentation

## 2019-03-22 DIAGNOSIS — L03012 Cellulitis of left finger: Secondary | ICD-10-CM | POA: Insufficient documentation

## 2019-03-22 DIAGNOSIS — J45909 Unspecified asthma, uncomplicated: Secondary | ICD-10-CM | POA: Insufficient documentation

## 2019-03-22 DIAGNOSIS — E119 Type 2 diabetes mellitus without complications: Secondary | ICD-10-CM | POA: Insufficient documentation

## 2019-03-22 DIAGNOSIS — Z79899 Other long term (current) drug therapy: Secondary | ICD-10-CM | POA: Insufficient documentation

## 2019-03-22 MED ORDER — SULFAMETHOXAZOLE-TRIMETHOPRIM 800-160 MG PO TABS: 1.0000 | ORAL_TABLET | Freq: Two times a day (BID) | ORAL | 0 refills | Status: DC

## 2019-03-22 NOTE — ED Notes (Signed)
Bed: WTR7 Expected date:  Expected time:  Means of arrival:  Comments: 

## 2019-03-22 NOTE — ED Triage Notes (Signed)
Patient states that he pulled a hangnail on the left ring finger 8 days ago. Patient has pain and swelling to the left ring finger. Patient states he squeezed the finger and did have a small amount of yellow from the finger.

## 2019-03-22 NOTE — Discharge Instructions (Signed)
Stop cipro. Soak area 20 minutes 4 times a day

## 2019-03-22 NOTE — ED Provider Notes (Signed)
[place] DEPT Provider Note   CSN: [id number] Arrival date & time: 03/22/19  1449     History   Chief Complaint Chief Complaint  Patient presents with  . Hand Pain  . finger swelling    HPI Shawn Benson is a 38 y.o. male.     The history is provided by the patient. No language interpreter was used.  Hand Pain This is a new problem. The current episode started 2 days ago. The problem occurs constantly. The problem has not changed since onset.Nothing aggravates the symptoms. Nothing relieves the symptoms. He has tried nothing for the symptoms. The treatment provided no relief.  Pt complains of an infection to his finger from pulling a hangnail   Past Medical History:  Diagnosis Date  . Allergy    Seasonal--spring through fall.  . Asthma childhood and adult   triggers are allergies and URI.  Spring, summer and fall.  . Diabetes mellitus without complication ([place])   . Morbid obesity ([place])   . Peripheral edema 04/09/2017    Patient Active Problem List   Diagnosis Date Noted  . Peripheral edema 04/09/2017  . Dyslipidemia 09/14/2016  . Sleep apnea 07/10/2016  . Tinea corporis 07/10/2016  . Tinea pedis 07/10/2016  . Abscess of axilla, left 05/11/2016  . Onychomycosis of toenail 12/06/2015  . Type 2 diabetes mellitus without complication, with long-term current use of insulin ([place]) 10/07/2015  . Asthma 10/06/2015  . Morbid obesity due to excess calories [facility])     Past Surgical History:  Procedure Laterality Date  . INCISION AND DRAINAGE PERIRECTAL ABSCESS N/A 10/06/2015   Procedure: IRRIGATION AND DEBRIDEMENT PERIANAL  ABSCESS;  Surgeon: Alphonsa Overall, MD;  Location: [facility];  Service: General;  Laterality: N/A;  . KNEE SURGERY     Right and Left Knee        Home Medications    Prior to Admission medications   Medication Sig Start Date End Date Taking? Authorizing Provider  Blood Glucose Monitoring Suppl (AGAMATRIX PRESTO)  w/Device KIT 1 Device by Does not apply route 2 (two) times daily. 03/30/16   Mack Hook, MD  cetirizine (ZYRTEC) 10 MG tablet Take 10 mg by mouth daily.    [provider]  ciprofloxacin (CIPRO) 500 MG tablet Take 1 tablet (500 mg total) by mouth 2 (two) times daily for 7 days. 03/15/19 03/22/19  Volanda Napoleon, PA-C  fluconazole (DIFLUCAN) 150 MG tablet 1 tab by mouth daily for 10 days Patient not taking: Reported on 03/07/2018 07/22/17   Mack Hook, MD  furosemide (LASIX) 20 MG tablet TAKE 1 TABLET BY MOUTH ONCE DAILY 03/03/18   Mack Hook, MD  glucose blood (AGAMATRIX PRESTO TEST) test strip Use as instructed 03/30/16   Mack Hook, MD  hydrochlorothiazide (HYDRODIURIL) 25 MG tablet Take 1 tablet (25 mg total) by mouth daily. Patient not taking: Reported on 03/07/2018 04/09/17   Mack Hook, MD  ibuprofen (ADVIL,MOTRIN) 600 MG tablet Take 1 tablet (600 mg total) by mouth every 6 (six) hours as needed. 03/07/18   Mack Hook, MD  insulin NPH-regular Human (NOVOLIN 70/30) (70-30) 100 UNIT/ML injection Inject 52 Units into the skin 2 (two) times daily with a meal. 52 units subcutaneously twice daily before meals 07/10/16   Mack Hook, MD  Insulin Syringe-Needle U-100 (INSULIN SYRINGE .5CC/31GX5/16") 31G X 5/16" 0.5 ML MISC 1 Device by Does not apply route 2 (two) times daily with a meal. 10/20/16   Mack Hook, MD  Lancets (FREESTYLE) lancets Use as instructed 10/20/16   Mack Hook, MD  metFORMIN (GLUCOPHAGE) 500 MG tablet TAKE ONE TABLET BY MOUTH TWICE DAILY WITH A MEAL 03/03/18   Mack Hook, MD  sulfamethoxazole-trimethoprim (BACTRIM DS) 800-160 MG tablet Take 1 tablet by mouth 2 (two) times daily. 03/22/19   Fransico Meadow, PA-C  traMADol (ULTRAM) 50 MG tablet Take 1 tablet (50 mg total) by mouth every 6 (six) hours as needed. 06/28/18   Milton Ferguson, MD  atorvastatin (LIPITOR) 20 MG tablet 1 tab by mouth daily  with dinner Patient not taking: Reported on 03/06/2018 07/23/17 03/22/19  Mack Hook, MD    Family History Family History  Problem Relation Age of Onset  . Diabetes Mother   . Diabetes Father   . Diabetes Brother   . Diabetes Brother     Social History Social History   Tobacco Use  . Smoking status: Current Every Day Smoker    Packs/day: 1.00    Types: Cigarettes    Last attempt to quit: 09/05/2015    Years since quitting: 3.5  . Smokeless tobacco: Never Used  . Tobacco comment: 5 a day  Substance Use Topics  . Alcohol use: Yes    Alcohol/week: 0.0 standard drinks    Comment: occasional beer  . Drug use: Yes    Types: Marijuana    Comment: occasonally     Allergies   Patient has no known allergies.   Review of Systems Review of Systems  All other systems reviewed and are negative.    Physical Exam Updated Vital Signs BP 124/77 (BP Location: Right Arm)   Pulse 65   Temp 98.2 F (36.8 C) (Oral)   Resp 16   Ht _0  (2.007 m)   Wt (!) 162.4 kg   SpO2 98%   BMI 40.33 kg/m   Physical Exam Vitals signs reviewed.  Constitutional:      Appearance: Normal appearance.  Musculoskeletal:        General: Swelling present.     Comments: Swollen tender left ringfinger cutical area   Skin:    General: Skin is warm.  Neurological:     General: No focal deficit present.     Mental Status: He is alert.  Psychiatric:        Mood and Affect: Mood normal.      ED Treatments / Results  Labs (all labs ordered are listed, but only abnormal results are displayed) Labs Reviewed - No data to display  EKG    Radiology No results found.  Procedures .Marland KitchenIncision and Drainage  Date/Time: 03/22/2019 4:43 PM Performed by: Fransico Meadow, PA-C Authorized by: Fransico Meadow, PA-C   Consent:    Consent obtained:  Verbal   Consent given by:  Patient   Risks discussed:  Bleeding and incomplete drainage   Alternatives discussed:  No treatment Location:     Type:  Abscess   Location:  Upper extremity   Upper extremity location:  Finger   Finger location:  L ring finger Pre-procedure details:    Skin preparation:  Chloraprep Procedure type:    Complexity:  Simple Procedure details:    Incision types:  Single straight   Scalpel blade:  11   Drainage amount:  Moderate   Wound treatment:  Wound left open Post-procedure details:    Patient tolerance of procedure:  Tolerated well, no immediate complications Comments:     Pt soaked after procedure    (including critical care time)  Medications Ordered in ED Medications - No data to display   Initial Impression / Assessment and Plan / ED Course  I have reviewed the triage vital signs and the nursing notes.  Pertinent labs & imaging results that were available during my care of the patient were reviewed by me and considered in my medical decision making (see chart for details).        MDM  Pt given rx for bactrim.  Pt advised to soak area 20 minutes 4 times a day.   Final Clinical Impressions(s) / ED Diagnoses   Final diagnoses:  Paronychia of finger of left hand    ED Discharge Orders         Ordered    sulfamethoxazole-trimethoprim (BACTRIM DS) 800-160 MG tablet  2 times daily     03/22/19 1551        An After Visit Summary was printed and given to the patient.    Fransico Meadow, Hershal Coria 03/22/19 1645    Fredia Sorrow, MD 03/23/19 [phone]

## 2019-06-24 ENCOUNTER — Emergency Department (HOSPITAL_COMMUNITY)
Admission: EM | Admit: 2019-06-24 | Discharge: 2019-06-24 | Disposition: A | Discharge status: Home / Self Care | Payer: Self-pay | Attending: Emergency Medicine | Admitting: Emergency Medicine

## 2019-06-24 ENCOUNTER — Encounter (HOSPITAL_COMMUNITY): Payer: Self-pay | Admitting: Obstetrics and Gynecology

## 2019-06-24 ENCOUNTER — Other Ambulatory Visit: Payer: Self-pay

## 2019-06-24 DIAGNOSIS — F17210 Nicotine dependence, cigarettes, uncomplicated: Secondary | ICD-10-CM | POA: Insufficient documentation

## 2019-06-24 DIAGNOSIS — E119 Type 2 diabetes mellitus without complications: Secondary | ICD-10-CM | POA: Insufficient documentation

## 2019-06-24 DIAGNOSIS — J45909 Unspecified asthma, uncomplicated: Secondary | ICD-10-CM | POA: Insufficient documentation

## 2019-06-24 DIAGNOSIS — F1210 Cannabis abuse, uncomplicated: Secondary | ICD-10-CM | POA: Insufficient documentation

## 2019-06-24 DIAGNOSIS — K0889 Other specified disorders of teeth and supporting structures: Secondary | ICD-10-CM | POA: Insufficient documentation

## 2019-06-24 DIAGNOSIS — Z794 Long term (current) use of insulin: Secondary | ICD-10-CM | POA: Insufficient documentation

## 2019-06-24 MED ORDER — HYDROCODONE-ACETAMINOPHEN 5-325 MG PO TABS
1.0000 | ORAL_TABLET | Freq: Once | ORAL | Status: AC
  Administered 2019-06-24: 10:00:00 1 via ORAL
  Filled 2019-06-24: qty 1

## 2019-06-24 MED ORDER — AMOXICILLIN 500 MG PO CAPS
500.0000 mg | ORAL_CAPSULE | Freq: Once | ORAL | Status: AC
  Administered 2019-06-24: 10:00:00 500 mg via ORAL
  Filled 2019-06-24: qty 1

## 2019-06-24 MED ORDER — AMOXICILLIN 500 MG PO CAPS: 500.0000 mg | ORAL_CAPSULE | Freq: Three times a day (TID) | ORAL | 0 refills | Status: DC

## 2019-06-24 MED ORDER — NAPROXEN 500 MG PO TABS: 500.0000 mg | ORAL_TABLET | Freq: Two times a day (BID) | ORAL | 0 refills | Status: DC

## 2019-06-24 NOTE — ED Triage Notes (Signed)
Pt is coming from home with complaint of right upper dental pain. Pt reports he has had this pain before and was prescribed amoxicillin about a year ago. Pt reports he took 2 of the "leftover amoxicillin" last night to see if he could make the pain stop. Pt states he also took ibuprofen.  Patient reports he cannot afford a dentist as he has 4 children and 3 grandchildren at home and is the only one currently working

## 2019-06-24 NOTE — ED Provider Notes (Signed)
[place] DEPT Provider Note   CSN: [id number] Arrival date & time: 06/24/19  2229     History   Chief Complaint Chief Complaint  Patient presents with  . Dental Pain    HPI Shawn Benson is a 38 y.o. male.     38 y/o male with PMH diabetes, obesity, dyslipidemia presenting to the emergency department for dental pain.  Patient reports that he has had pain in this tooth before.  He has a broken right upper tooth.  Reports that he had 2 leftover amoxicillin and he took them yesterday and it did not help.  Denies any fever, chills, trouble breathing or swallowing.  Has not seen a dentist.     Past Medical History:  Diagnosis Date  . Allergy    Seasonal--spring through fall.  . Asthma childhood and adult   triggers are allergies and URI.  Spring, summer and fall.  . Diabetes mellitus without complication ([place])   . Morbid obesity ([place])   . Peripheral edema 04/09/2017    Patient Active Problem List   Diagnosis Date Noted  . Peripheral edema 04/09/2017  . Dyslipidemia 09/14/2016  . Sleep apnea 07/10/2016  . Tinea corporis 07/10/2016  . Tinea pedis 07/10/2016  . Abscess of axilla, left 05/11/2016  . Onychomycosis of toenail 12/06/2015  . Type 2 diabetes mellitus without complication, with long-term current use of insulin ([place]) 10/07/2015  . Asthma 10/06/2015  . Morbid obesity due to excess calories [facility])     Past Surgical History:  Procedure Laterality Date  . INCISION AND DRAINAGE PERIRECTAL ABSCESS N/A 10/06/2015   Procedure: IRRIGATION AND DEBRIDEMENT PERIANAL  ABSCESS;  Surgeon: Alphonsa Overall, MD;  Location: [facility];  Service: General;  Laterality: N/A;  . KNEE SURGERY     Right and Left Knee        Home Medications    Prior to Admission medications   Medication Sig Start Date End Date Taking? Authorizing Provider  amoxicillin (AMOXIL) 500 MG capsule Take 1 capsule (500 mg total) by mouth 3 (three) times daily. 06/24/19    Alveria Apley, PA-C  Blood Glucose Monitoring Suppl (AGAMATRIX PRESTO) w/Device KIT 1 Device by Does not apply route 2 (two) times daily. 03/30/16   Mack Hook, MD  cetirizine (ZYRTEC) 10 MG tablet Take 10 mg by mouth daily.    [provider]  fluconazole (DIFLUCAN) 150 MG tablet 1 tab by mouth daily for 10 days Patient not taking: Reported on 03/07/2018 07/22/17   Mack Hook, MD  furosemide (LASIX) 20 MG tablet TAKE 1 TABLET BY MOUTH ONCE DAILY 03/03/18   Mack Hook, MD  glucose blood (AGAMATRIX PRESTO TEST) test strip Use as instructed 03/30/16   Mack Hook, MD  hydrochlorothiazide (HYDRODIURIL) 25 MG tablet Take 1 tablet (25 mg total) by mouth daily. Patient not taking: Reported on 03/07/2018 04/09/17   Mack Hook, MD  ibuprofen (ADVIL,MOTRIN) 600 MG tablet Take 1 tablet (600 mg total) by mouth every 6 (six) hours as needed. 03/07/18   Mack Hook, MD  insulin NPH-regular Human (NOVOLIN 70/30) (70-30) 100 UNIT/ML injection Inject 52 Units into the skin 2 (two) times daily with a meal. 52 units subcutaneously twice daily before meals 07/10/16   Mack Hook, MD  Insulin Syringe-Needle U-100 (INSULIN SYRINGE .5CC/31GX5/16") 31G X 5/16" 0.5 ML MISC 1 Device by Does not apply route 2 (two) times daily with a meal. 10/20/16   Mack Hook, MD  Lancets (FREESTYLE) lancets Use as instructed  10/20/16   Mack Hook, MD  metFORMIN (GLUCOPHAGE) 500 MG tablet TAKE ONE TABLET BY MOUTH TWICE DAILY WITH A MEAL 03/03/18   Mack Hook, MD  naproxen (NAPROSYN) 500 MG tablet Take 1 tablet (500 mg total) by mouth 2 (two) times daily. 06/24/19   Madilyn Hook A, PA-C  sulfamethoxazole-trimethoprim (BACTRIM DS) 800-160 MG tablet Take 1 tablet by mouth 2 (two) times daily. 03/22/19   Fransico Meadow, PA-C  traMADol (ULTRAM) 50 MG tablet Take 1 tablet (50 mg total) by mouth every 6 (six) hours as needed. 06/28/18   Milton Ferguson, MD   atorvastatin (LIPITOR) 20 MG tablet 1 tab by mouth daily with dinner Patient not taking: Reported on 03/06/2018 07/23/17 03/22/19  Mack Hook, MD    Family History Family History  Problem Relation Age of Onset  . Diabetes Mother   . Diabetes Father   . Diabetes Brother   . Diabetes Brother     Social History Social History   Tobacco Use  . Smoking status: Current Every Day Smoker    Packs/day: 1.00    Types: Cigarettes    Last attempt to quit: 09/05/2015    Years since quitting: 3.8  . Smokeless tobacco: Never Used  . Tobacco comment: 5 a day  Substance Use Topics  . Alcohol use: Yes    Alcohol/week: 0.0 standard drinks    Comment: occasional beer  . Drug use: Yes    Types: Marijuana    Comment: occasonally     Allergies   Patient has no known allergies.   Review of Systems Review of Systems  Constitutional: Negative for chills and fever.  HENT: Positive for dental problem. Negative for congestion, ear pain, sore throat and trouble swallowing.   Eyes: Negative for pain and visual disturbance.  Respiratory: Negative for cough and shortness of breath.   Cardiovascular: Negative for chest pain and palpitations.  Gastrointestinal: Negative for abdominal pain and vomiting.  Genitourinary: Negative for dysuria and hematuria.  Musculoskeletal: Negative for arthralgias and back pain.  Skin: Negative for color change and rash.  Neurological: Negative for seizures and syncope.  All other systems reviewed and are negative.    Physical Exam Updated Vital Signs BP (!) 155/79 (BP Location: Right Arm)   Pulse 76   Temp 98.4 F (36.9 C) (Oral)   Resp 16   SpO2 100%   Physical Exam Vitals signs and nursing note reviewed. Exam conducted with a chaperone present.  Constitutional:      Appearance: Normal appearance.  HENT:     Head: Normocephalic.     Mouth/Throat:     Mouth: Mucous membranes are moist.   Eyes:     Conjunctiva/sclera: Conjunctivae normal.   Pulmonary:     Effort: Pulmonary effort is normal.  Skin:    General: Skin is dry.  Neurological:     Mental Status: He is alert.  Psychiatric:        Mood and Affect: Mood normal.      ED Treatments / Results  Labs (all labs ordered are listed, but only abnormal results are displayed) Labs Reviewed - No data to display  EKG None  Radiology No results found.  Procedures Procedures (including critical care time)  Medications Ordered in ED Medications  HYDROcodone-acetaminophen (NORCO/VICODIN) 5-325 MG per tablet 1 tablet (has no administration in time range)  amoxicillin (AMOXIL) capsule 500 mg (has no administration in time range)     Initial Impression / Assessment and Plan / ED Course  I have reviewed the triage vital signs and the nursing notes.  Pertinent labs & imaging results that were available during my care of the patient were reviewed by me and considered in my medical decision making (see chart for details).        Based on review of vitals, medical screening exam, lab work and/or imaging, there does not appear to be an acute, emergent etiology for the patient's symptoms. Counseled pt on good return precautions and encouraged both PCP and ED follow-up as needed.  Prior to discharge, I also discussed incidental imaging findings with patient in detail and advised appropriate, recommended follow-up in detail.  Clinical Impression: 1. Pain, dental     Disposition: Discharge  Prior to providing a prescription for a controlled substance, I independently reviewed the patient's recent prescription history on the [place]. The patient had no recent or regular prescriptions and was deemed appropriate for a brief, less than 3 day prescription of narcotic for acute analgesia.  This note was prepared with assistance of Systems analyst. Occasional wrong-word or sound-a-like substitutions may have  occurred due to the inherent limitations of voice recognition software.    Final Clinical Impressions(s) / ED Diagnoses   Final diagnoses:  Pain, dental    ED Discharge Orders         Ordered    naproxen (NAPROSYN) 500 MG tablet  2 times daily     06/24/19 0941    amoxicillin (AMOXIL) 500 MG capsule  3 times daily     06/24/19 0941           Alveria Apley, PA-C 06/24/19 0941    Drenda Freeze, MD 06/24/19 [phone]

## 2020-01-19 ENCOUNTER — Emergency Department (HOSPITAL_COMMUNITY)
Admission: EM | Admit: 2020-01-19 | Discharge: 2020-01-19 | Disposition: A | Discharge status: Home / Self Care | Payer: Self-pay | Attending: Emergency Medicine | Admitting: Emergency Medicine

## 2020-01-19 ENCOUNTER — Other Ambulatory Visit: Payer: Self-pay

## 2020-01-19 ENCOUNTER — Emergency Department (HOSPITAL_COMMUNITY): Payer: Self-pay

## 2020-01-19 DIAGNOSIS — F17210 Nicotine dependence, cigarettes, uncomplicated: Secondary | ICD-10-CM | POA: Insufficient documentation

## 2020-01-19 DIAGNOSIS — Y929 Unspecified place or not applicable: Secondary | ICD-10-CM | POA: Insufficient documentation

## 2020-01-19 DIAGNOSIS — Y999 Unspecified external cause status: Secondary | ICD-10-CM | POA: Insufficient documentation

## 2020-01-19 DIAGNOSIS — S62339A Displaced fracture of neck of unspecified metacarpal bone, initial encounter for closed fracture: Secondary | ICD-10-CM

## 2020-01-19 DIAGNOSIS — S62306A Unspecified fracture of fifth metacarpal bone, right hand, initial encounter for closed fracture: Secondary | ICD-10-CM | POA: Insufficient documentation

## 2020-01-19 DIAGNOSIS — Y939 Activity, unspecified: Secondary | ICD-10-CM | POA: Insufficient documentation

## 2020-01-19 DIAGNOSIS — J45909 Unspecified asthma, uncomplicated: Secondary | ICD-10-CM | POA: Insufficient documentation

## 2020-01-19 DIAGNOSIS — Z79899 Other long term (current) drug therapy: Secondary | ICD-10-CM | POA: Insufficient documentation

## 2020-01-19 DIAGNOSIS — E119 Type 2 diabetes mellitus without complications: Secondary | ICD-10-CM | POA: Insufficient documentation

## 2020-01-19 DIAGNOSIS — Z794 Long term (current) use of insulin: Secondary | ICD-10-CM | POA: Insufficient documentation

## 2020-01-19 DIAGNOSIS — W51XXXA Accidental striking against or bumped into by another person, initial encounter: Secondary | ICD-10-CM | POA: Insufficient documentation

## 2020-01-19 MED ORDER — TRAMADOL HCL 50 MG PO TABS: 50.0000 mg | ORAL_TABLET | Freq: Four times a day (QID) | ORAL | 0 refills | Status: DC | PRN

## 2020-01-19 NOTE — Discharge Instructions (Signed)
Return here as needed.  Follow-up closely with the hand surgeon provided.  Call them this afternoon for an appointment.
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
## 2020-01-20 DIAGNOSIS — S62309A Unspecified fracture of unspecified metacarpal bone, initial encounter for closed fracture: Secondary | ICD-10-CM | POA: Insufficient documentation

## 2020-01-20 DIAGNOSIS — M79641 Pain in right hand: Secondary | ICD-10-CM | POA: Insufficient documentation

## 2020-01-27 IMAGING — CR RIGHT FOOT COMPLETE - 3+ VIEW
3 series · 3 of 3 positions shown · non-contrast
Comparison: None.

CLINICAL DATA: Initial evaluation for acute foot pain, stepped on
nail on 03/13/2019. No acute

EXAM:
RIGHT FOOT COMPLETE - 3+ VIEW

[x foot ap right]
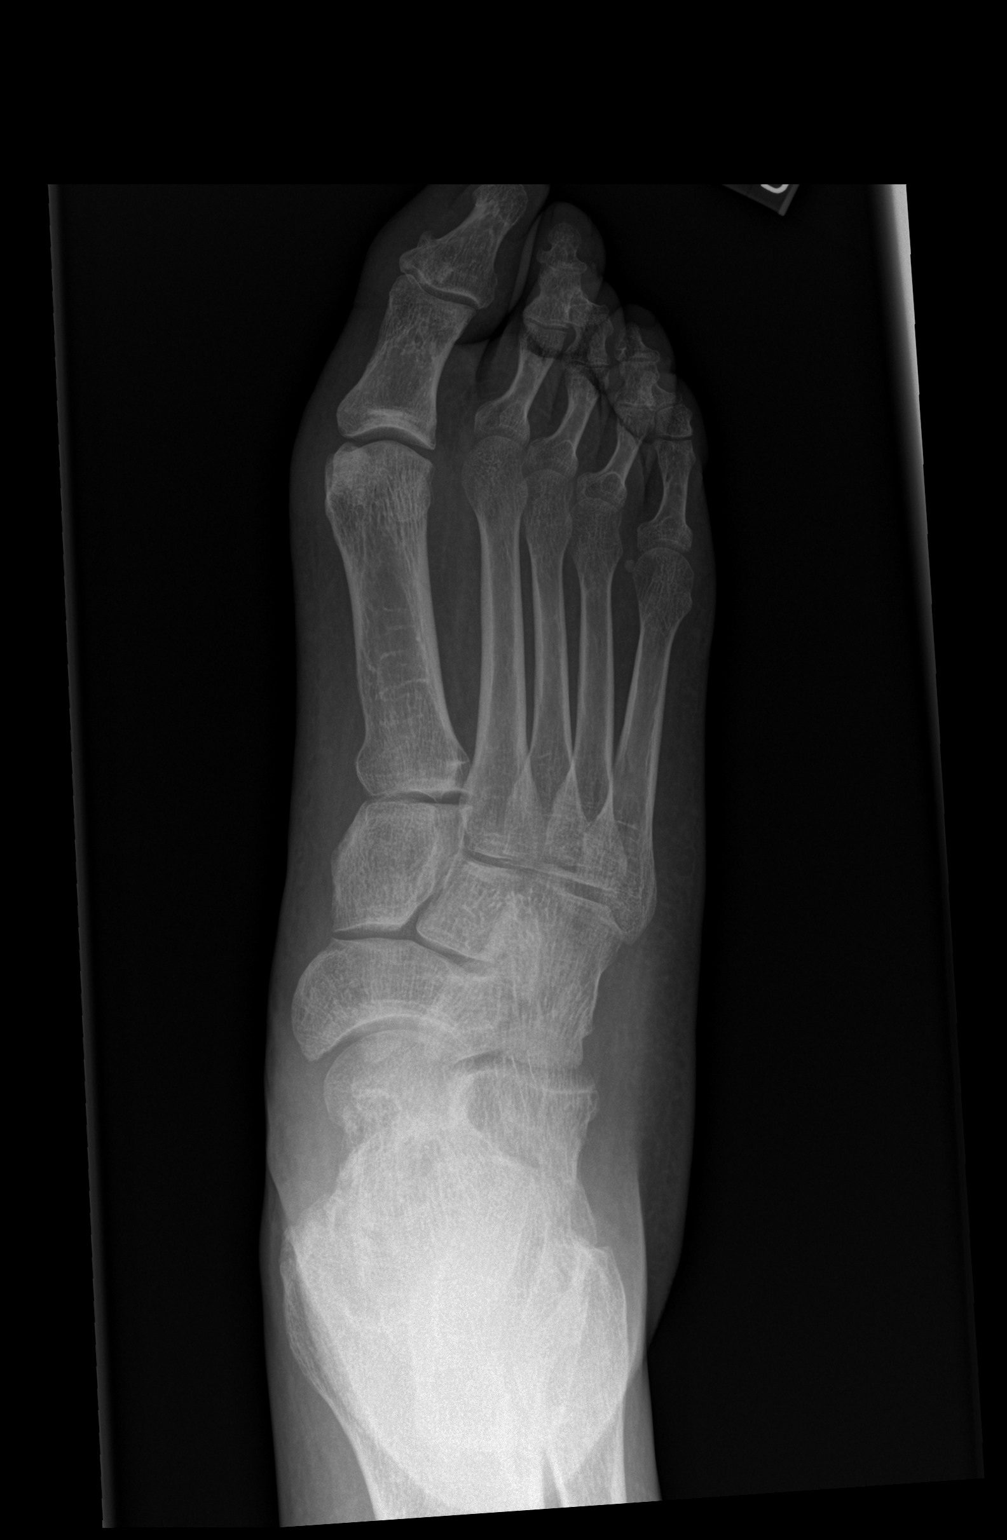

[x foot obl right]
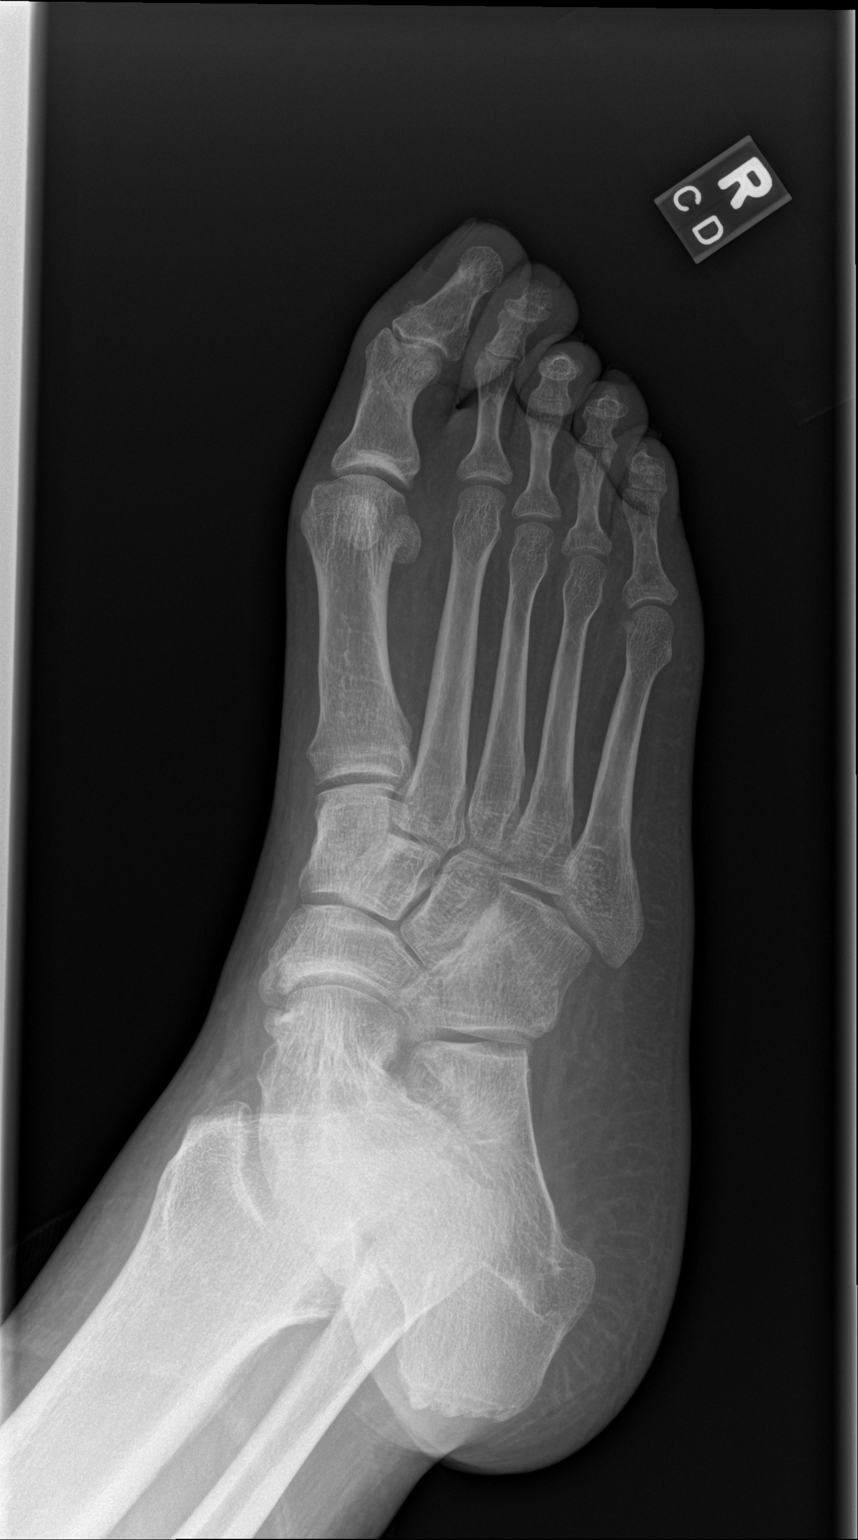

[x foot lat right]
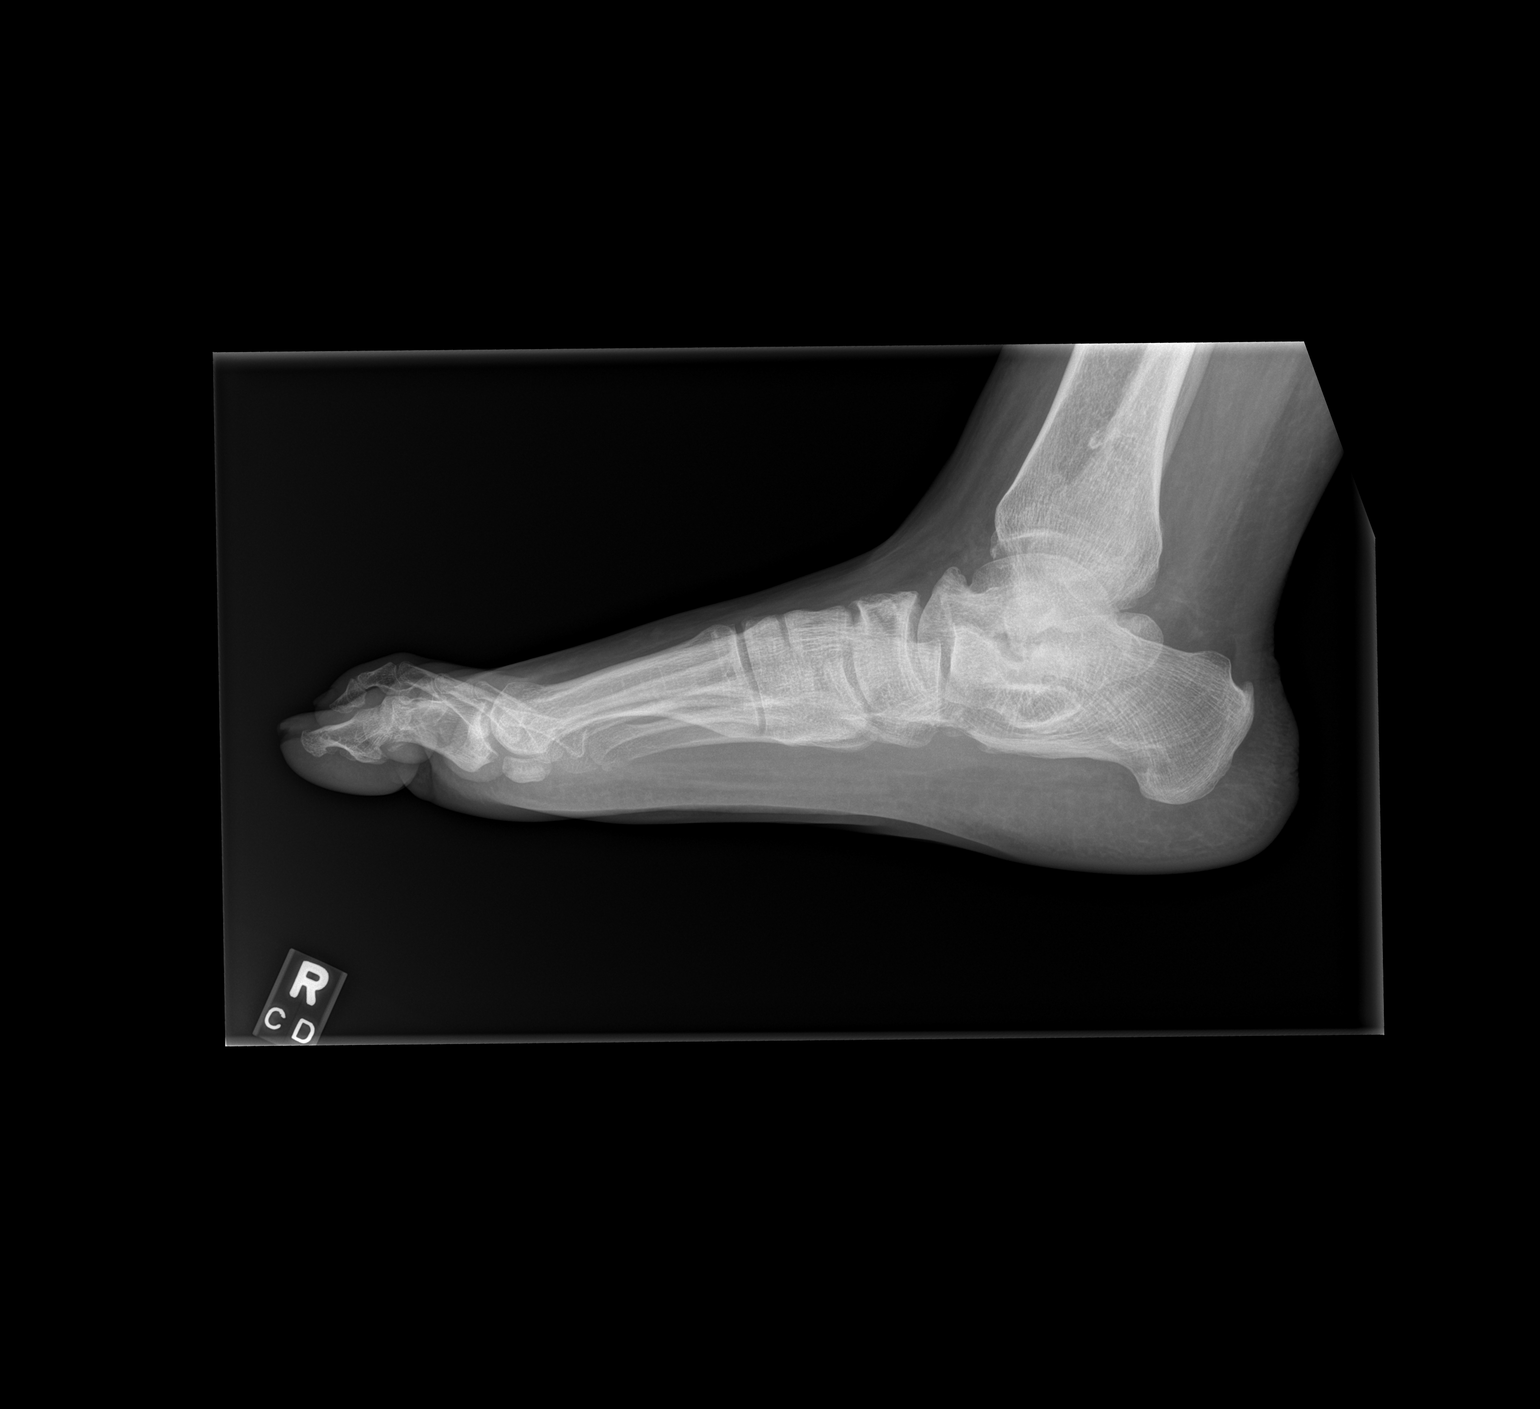

[3 of 3 positions shown; findings below may reference images not displayed]

FINDINGS: Fracture or dislocation. Scattered osteoarthritic changes noted
about the midfoot. Joint spaces otherwise maintained without
evidence for significant degenerative or erosive arthropathy. No
visible soft tissue injury. No radiopaque foreign body.
IMPRESSION: 1. No visible soft tissue injury about the foot. No radiopaque
foreign body.
2. No acute osseous abnormality.
3. Degenerative osteoarthritic changes about the right midfoot.

## 2020-05-27 ENCOUNTER — Emergency Department (HOSPITAL_COMMUNITY)
Admission: EM | Admit: 2020-05-27 | Discharge: 2020-05-27 | Disposition: A | Discharge status: Home / Self Care | Payer: Self-pay | Attending: Emergency Medicine | Admitting: Emergency Medicine

## 2020-05-27 ENCOUNTER — Encounter (HOSPITAL_COMMUNITY): Payer: Self-pay | Admitting: Emergency Medicine

## 2020-05-27 ENCOUNTER — Other Ambulatory Visit: Payer: Self-pay

## 2020-05-27 ENCOUNTER — Telehealth: Payer: Self-pay | Admitting: Internal Medicine

## 2020-05-27 DIAGNOSIS — K047 Periapical abscess without sinus: Secondary | ICD-10-CM | POA: Insufficient documentation

## 2020-05-27 DIAGNOSIS — Z5321 Procedure and treatment not carried out due to patient leaving prior to being seen by health care provider: Secondary | ICD-10-CM | POA: Insufficient documentation

## 2020-05-27 NOTE — Telephone Encounter (Signed)
Spoke with patient. Informed he has not been seen in over 2 years will need to re-establish before anything can be done. Patient given next available appointment for 07/08/20 @ 9:30 am

## 2020-05-27 NOTE — Telephone Encounter (Signed)
Patient called stating is at [facility] ED right now to see if can get prescribed for antibiotics for infection on his tooth. Patient stated will have a tooth pulled next week but will need to take care of the infection first. Patient requesting Dr. Delrae Alfred to prescribe the antibiotics;  stated will like not to get a high bill from there just for that. Patient advised needs to be seen to be able to get prescription for it; but patient stated does not want to pay $60.00 dollars co-pay either.  Patient advised will receive a call back to see what's can be done.  Pt's number [phone]

## 2020-05-27 NOTE — ED Triage Notes (Signed)
Per pt, states right upper abscess-suppose to have tooth extracted next week-states he needs an antibiotic till he is seen by dentist

## 2020-07-08 ENCOUNTER — Ambulatory Visit (INDEPENDENT_AMBULATORY_CARE_PROVIDER_SITE_OTHER): Payer: Self-pay | Admitting: Internal Medicine

## 2020-07-08 ENCOUNTER — Encounter: Payer: Self-pay | Admitting: Internal Medicine

## 2020-07-08 VITALS — BP 124/78 | HR 72 | Resp 12 | Ht 76.0 in | Wt 372.0 lb

## 2020-07-08 DIAGNOSIS — E1165 Type 2 diabetes mellitus with hyperglycemia: Secondary | ICD-10-CM

## 2020-07-08 DIAGNOSIS — E785 Hyperlipidemia, unspecified: Secondary | ICD-10-CM

## 2020-07-08 DIAGNOSIS — K047 Periapical abscess without sinus: Secondary | ICD-10-CM

## 2020-07-08 DIAGNOSIS — R609 Edema, unspecified: Secondary | ICD-10-CM

## 2020-07-08 MED ORDER — PENICILLIN V POTASSIUM 500 MG PO TABS: ORAL_TABLET | ORAL | 0 refills | Status: DC

## 2020-07-08 NOTE — Progress Notes (Signed)
    Subjective:    Patient ID: Shawn Benson, male   DOB: 1980-12-24, 39 y.o.   MRN: [id number]   HPI   Re establishing. Has not been seen since 02/2018  1.  Abscessed tooth:  For at least 2 months.  Opens it up on his own every day and then feels better for a while.  Needs to have it treated before he feels it will be removed.  [place] [facility] [place] [facility] [address]  No medication allergies.    2.  IDDM:  Never checking sugars.  Using novolin 70/30 66 units SQ twice daily.  Has had no follow up since 02/2018.  3.  Hyperlipidemia:  No meds for some time.    4.  Some swelling of left ankle, but having discomfort in general with that foot with first and third shift work.  On feet with work regularly.  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  Current Meds  Medication Sig   insulin NPH-regular Human (NOVOLIN 70/30) (70-30) 100 UNIT/ML injection Inject 52 Units into the skin 2 (two) times daily with a meal. 52 units subcutaneously twice daily before meals (Patient taking differently: Inject 66 Units into the skin 2 (two) times daily with a meal. 66 units subcutaneously twice daily before meals)   Insulin Syringe-Needle U-100 (INSULIN SYRINGE .5CC/31GX5/16") 31G X 5/16" 0.5 ML MISC 1 Device by Does not apply route 2 (two) times daily with a meal.     No Known Allergies   Review of Systems    Objective:   BP 124/78 (BP Location: Left Arm, Patient Position: Sitting, Cuff Size: Large)   Pulse 72   Resp 12   Ht 6\' 4"  (1.93 m)   Wt (!) 372 lb (168.7 kg)   BMI 45.28 kg/m   Physical Exam Morbidly obese, NAD HEENT:  PERRL, EOMI, TMs pearly gray.  Right upper premolar with swelling of gum, which does drain small amount of pus with pressure.  No swelling of face. Neck:  Supple, No adenopathy Chest:  CTA CV:  RRR without murmur or rub.  No carotid bruit.  Carotid, radial and DP pulses normal and equal. Abd:  S, NT, No HSM or mass, + BS LE:  Mild edema of  ankles.   Assessment & Plan   Abscessed Tooth:  draining spontaneously.  Penicillin 500 mg 4 times daily for 7 days.  Needs to get back to dentist.  Has a dentist [address] he already sees.  CBC  2.  IDDM/morbid obesity:  extremely poor compliance.  A1C and urine microalbumin/crea:  suspect once treated for abscess will likely not return for long period of time, despite our long discussions of diabetic complications if not adequately treated.  Discussed improving diet and physical activity.  3.  Dyslipidemia:  FLP  4.  Mild peripheral edema:  check electrolytes and kidney function as well as CBC.  5.  HM:  return Monday for Saturday booster.

## 2020-07-09 LAB — CBC WITH DIFFERENTIAL/PLATELET
Basophils Absolute: 0 10*3/uL (ref 0.0–0.2)
Basos: 0 %
EOS (ABSOLUTE): 0.2 10*3/uL (ref 0.0–0.4)
Eos: 2 %
Hematocrit: 40.1 % (ref 37.5–51.0)
Hemoglobin: 12.8 g/dL — ABNORMAL LOW (ref 13.0–17.7)
Immature Grans (Abs): 0 10*3/uL (ref 0.0–0.1)
Immature Granulocytes: 0 %
Lymphocytes Absolute: 1.9 10*3/uL (ref 0.7–3.1)
Lymphs: 18 %
MCH: 26 pg — ABNORMAL LOW (ref 26.6–33.0)
MCHC: 31.9 g/dL (ref 31.5–35.7)
MCV: 82 fL (ref 79–97)
Monocytes Absolute: 0.5 10*3/uL (ref 0.1–0.9)
Monocytes: 5 %
Neutrophils Absolute: 7.4 10*3/uL — ABNORMAL HIGH (ref 1.4–7.0)
Neutrophils: 75 %
Platelets: 315 10*3/uL (ref 150–450)
RBC: 4.92 x10E6/uL (ref 4.14–5.80)
RDW: 13.2 % (ref 11.6–15.4)
WBC: 10.1 10*3/uL (ref 3.4–10.8)

## 2020-07-09 LAB — COMPREHENSIVE METABOLIC PANEL
ALT: 9 IU/L (ref 0–44)
AST: 7 IU/L (ref 0–40)
Albumin/Globulin Ratio: 1.5 (ref 1.2–2.2)
Albumin: 3.9 g/dL — ABNORMAL LOW (ref 4.0–5.0)
Alkaline Phosphatase: 98 IU/L (ref 44–121)
BUN/Creatinine Ratio: 18 (ref 9–20)
BUN: 13 mg/dL (ref 6–24)
Bilirubin Total: <0.2 mg/dL (ref 0.0–1.2)
CO2: 24 mmol/L (ref 20–29)
Calcium: 8.9 mg/dL (ref 8.7–10.2)
Chloride: 103 mmol/L (ref 96–106)
Creatinine, Ser: 0.74 mg/dL — ABNORMAL LOW (ref 0.76–1.27)
GFR calc Af Amer: 132 mL/min/{1.73_m2} (ref >59)
GFR calc non Af Amer: 115 mL/min/{1.73_m2} (ref >59)
Globulin, Total: 2.6 g/dL (ref 1.5–4.5)
Glucose: 226 mg/dL — ABNORMAL HIGH (ref 65–99)
Potassium: 4.1 mmol/L (ref 3.5–5.2)
Sodium: 141 mmol/L (ref 134–144)
Total Protein: 6.5 g/dL (ref 6.0–8.5)

## 2020-07-09 LAB — MICROALBUMIN / CREATININE URINE RATIO
Creatinine, Urine: 112.8 mg/dL
Microalb/Creat Ratio: 8 mg/g creat (ref 0–29)
Microalbumin, Urine: 8.5 ug/mL

## 2020-07-09 LAB — LIPID PANEL W/O CHOL/HDL RATIO
Cholesterol, Total: 129 mg/dL (ref 100–199)
HDL: 31 mg/dL — ABNORMAL LOW (ref >39)
LDL Chol Calc (NIH): 84 mg/dL (ref 0–99)
Triglycerides: 69 mg/dL (ref 0–149)
VLDL Cholesterol Cal: 14 mg/dL (ref 5–40)

## 2020-07-09 LAB — HGB A1C W/O EAG: Hgb A1c MFr Bld: 11.2 % — ABNORMAL HIGH (ref 4.8–5.6)

## 2020-07-28 ENCOUNTER — Encounter: Payer: Self-pay | Admitting: Internal Medicine

## 2020-09-16 ENCOUNTER — Telehealth: Payer: Self-pay | Admitting: Internal Medicine

## 2020-09-16 MED ORDER — ACCU-CHEK AVIVA PLUS W/DEVICE KIT: PACK | 0 refills | Status: DC

## 2020-09-16 MED ORDER — ACCU-CHEK SOFTCLIX LANCETS MISC: 11 refills | Status: DC

## 2020-09-16 MED ORDER — ACCU-CHEK AVIVA PLUS VI STRP: ORAL_STRIP | 11 refills | Status: DC

## 2020-09-16 NOTE — Telephone Encounter (Signed)
Patient came in to request print out of his labs from 07/08/2020- patient informed letter had been sent after multiple attempts to contact him to relate results and Dr. Renne Crigler instructions below    Notify his A1C is quite high, though not as high as 2 years ago.  Cholesterol was close to goal See if he can afford a glucometer, test strips and lancets at [facility] as I cannot make the best recommendation with his insulin dosing if I don't know when his sugars are high.  Would like for him to check sugars before breakfast and dinner daily ans write them down.   He needs to call them  in 2 weeks. Let me know where he would like me to send the prescriptions for the monitoring supplies For now, would like him to increase his Novolin 70/30 to 56 units in the morning and 54 units before his evening meal.   Patient states he has been doing 66 units if the Novolin for a long time now .and is doing good. He will start documenting his sugars and call in a report in 2 weeks. He is able to afford medical supplies and would like Rx to be sent to [facility] on [address]. Informed is very important to return calls. And that information  Regarding 66 units of Novolin will be related to Dr. Delrae Alfred for further instruction

## 2020-10-19 ENCOUNTER — Ambulatory Visit: Payer: Self-pay | Admitting: Internal Medicine
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
## 2021-06-28 ENCOUNTER — Encounter: Payer: Self-pay | Admitting: Internal Medicine

## 2021-06-28 ENCOUNTER — Ambulatory Visit (INDEPENDENT_AMBULATORY_CARE_PROVIDER_SITE_OTHER): Payer: BC Managed Care – PPO | Admitting: Internal Medicine

## 2021-06-28 ENCOUNTER — Other Ambulatory Visit: Payer: Self-pay

## 2021-06-28 VITALS — BP 110/72 | HR 80 | Resp 12 | Ht 76.0 in | Wt 367.5 lb

## 2021-06-28 DIAGNOSIS — Z79899 Other long term (current) drug therapy: Secondary | ICD-10-CM | POA: Diagnosis not present

## 2021-06-28 DIAGNOSIS — E1165 Type 2 diabetes mellitus with hyperglycemia: Secondary | ICD-10-CM | POA: Diagnosis not present

## 2021-06-28 DIAGNOSIS — E1142 Type 2 diabetes mellitus with diabetic polyneuropathy: Secondary | ICD-10-CM | POA: Diagnosis not present

## 2021-06-28 DIAGNOSIS — B351 Tinea unguium: Secondary | ICD-10-CM

## 2021-06-28 MED ORDER — FREESTYLE LIBRE 2 READER DEVI: 11 refills | Status: DC

## 2021-06-28 MED ORDER — FREESTYLE LIBRE 2 SENSOR MISC: 11 refills | Status: DC

## 2021-06-28 MED ORDER — TERBINAFINE HCL 250 MG PO TABS: ORAL_TABLET | ORAL | 0 refills | Status: DC
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
## 2021-06-29 LAB — COMPREHENSIVE METABOLIC PANEL
ALT: 11 IU/L (ref 0–44)
AST: 9 IU/L (ref 0–40)
Albumin/Globulin Ratio: 1.4 (ref 1.2–2.2)
Albumin: 4 g/dL (ref 4.0–5.0)
Alkaline Phosphatase: 118 IU/L (ref 44–121)
BUN/Creatinine Ratio: 13 (ref 9–20)
BUN: 10 mg/dL (ref 6–24)
Bilirubin Total: 0.3 mg/dL (ref 0.0–1.2)
CO2: 22 mmol/L (ref 20–29)
Calcium: 9.4 mg/dL (ref 8.7–10.2)
Chloride: 100 mmol/L (ref 96–106)
Creatinine, Ser: 0.8 mg/dL (ref 0.76–1.27)
Globulin, Total: 2.9 g/dL (ref 1.5–4.5)
Glucose: 320 mg/dL — ABNORMAL HIGH (ref 65–99)
Potassium: 3.7 mmol/L (ref 3.5–5.2)
Sodium: 139 mmol/L (ref 134–144)
Total Protein: 6.9 g/dL (ref 6.0–8.5)
eGFR: 113 mL/min/{1.73_m2} (ref >59)

## 2021-06-29 LAB — CBC WITH DIFFERENTIAL/PLATELET
Basophils Absolute: 0.1 10*3/uL (ref 0.0–0.2)
Basos: 1 %
EOS (ABSOLUTE): 0.4 10*3/uL (ref 0.0–0.4)
Eos: 4 %
Hematocrit: 44.8 % (ref 37.5–51.0)
Hemoglobin: 14.3 g/dL (ref 13.0–17.7)
Immature Grans (Abs): 0 10*3/uL (ref 0.0–0.1)
Immature Granulocytes: 0 %
Lymphocytes Absolute: 2.4 10*3/uL (ref 0.7–3.1)
Lymphs: 25 %
MCH: 26.3 pg — ABNORMAL LOW (ref 26.6–33.0)
MCHC: 31.9 g/dL (ref 31.5–35.7)
MCV: 82 fL (ref 79–97)
Monocytes Absolute: 0.5 10*3/uL (ref 0.1–0.9)
Monocytes: 5 %
Neutrophils Absolute: 6.2 10*3/uL (ref 1.4–7.0)
Neutrophils: 65 %
Platelets: 297 10*3/uL (ref 150–450)
RBC: 5.44 x10E6/uL (ref 4.14–5.80)
RDW: 13.6 % (ref 11.6–15.4)
WBC: 9.5 10*3/uL (ref 3.4–10.8)

## 2021-06-29 LAB — HGB A1C W/O EAG: Hgb A1c MFr Bld: 12.9 % — ABNORMAL HIGH (ref 4.8–5.6)

## 2021-07-13 ENCOUNTER — Other Ambulatory Visit: Payer: BC Managed Care – PPO

## 2021-07-20 ENCOUNTER — Telehealth: Payer: Self-pay

## 2021-07-20 NOTE — Telephone Encounter (Signed)
Pt called asking to get terbinafine and the continuous blood gluc sensor and receiver resent to the pharmacy. Previously did not purchase because he did not have the money for it

## 2021-07-27 ENCOUNTER — Encounter: Payer: Self-pay | Admitting: Sports Medicine

## 2021-07-27 ENCOUNTER — Ambulatory Visit (INDEPENDENT_AMBULATORY_CARE_PROVIDER_SITE_OTHER): Payer: BC Managed Care – PPO | Admitting: Sports Medicine

## 2021-07-27 ENCOUNTER — Other Ambulatory Visit: Payer: Self-pay

## 2021-07-27 DIAGNOSIS — E119 Type 2 diabetes mellitus without complications: Secondary | ICD-10-CM | POA: Diagnosis not present

## 2021-07-27 DIAGNOSIS — E1142 Type 2 diabetes mellitus with diabetic polyneuropathy: Secondary | ICD-10-CM | POA: Diagnosis not present

## 2021-07-27 DIAGNOSIS — I739 Peripheral vascular disease, unspecified: Secondary | ICD-10-CM

## 2021-07-27 DIAGNOSIS — L853 Xerosis cutis: Secondary | ICD-10-CM

## 2021-07-27 NOTE — Progress Notes (Signed)
Subjective: Shawn Benson is a 40 y.o. male patient with history of diabetes who presents to office today for diabetic foot exam.  Patient admits some tingling but does not take any medication for it states that he smokes weed and that this is his primary form of medication and he has been doing this for many years.  Patient is diabetic and was diagnosed 4-5 years ago and does not know what his last blood sugar was however upon chart review his last A1c was 12.  Patient denies any other pedal complaints at this time.  Patient Active Problem List   Diagnosis Date Noted   Abscessed tooth 07/08/2020   Peripheral edema 04/09/2017   Dyslipidemia 09/14/2016   Sleep apnea 07/10/2016   Tinea corporis 07/10/2016   Tinea pedis 07/10/2016   Abscess of axilla, left 05/11/2016   Onychomycosis of toenail 12/06/2015   Type 2 diabetes mellitus without complication, with long-term current use of insulin ([place]) 10/07/2015   Asthma 10/06/2015   Morbid obesity due to excess calories ([place])    Current Outpatient Medications on File Prior to Visit  Medication Sig Dispense Refill   Blood Glucose Monitoring Suppl (ACCU-CHEK AVIVA PLUS) w/Device KIT Check blood sugar before meals 3 times daily 1 kit 0   cetirizine (ZYRTEC) 10 MG tablet Take 10 mg by mouth daily.     Continuous Blood Gluc Receiver (FREESTYLE LIBRE 2 READER) DEVI Check blood glucose 4 times daily before meals and at bedtime. 1 each 11   Continuous Blood Gluc Sensor (FREESTYLE LIBRE 2 SENSOR) MISC Check Blood glucose 4 times daily before each meal and at bedtime 1 each 11   insulin NPH-regular Human (NOVOLIN 70/30) (70-30) 100 UNIT/ML injection Inject 52 Units into the skin 2 (two) times daily with a meal. 52 units subcutaneously twice daily before meals (Patient taking differently: Inject 68 Units into the skin 2 (two) times daily with a meal. 68 units subcutaneously twice daily before meals) 30 mL 11   Insulin Syringe-Needle U-100 (INSULIN SYRINGE  .5CC/31GX5/16") 31G X 5/16" 0.5 ML MISC 1 Device by Does not apply route 2 (two) times daily with a meal. 100 each 11   terbinafine (LAMISIL) 250 MG tablet 1 tab by mouth daily for 84 days. 84 tablet 0   [DISCONTINUED] atorvastatin (LIPITOR) 20 MG tablet 1 tab by mouth daily with dinner (Patient not taking: Reported on 03/06/2018) 30 tablet 11   No current facility-administered medications on file prior to visit.   No Known Allergies  Recent Results (from the past 2160 hour(s))  Hgb A1c w/o eAG     Status: Abnormal   Collection Time: 06/28/21  6:16 PM  Result Value Ref Range   Hgb A1c MFr Bld 12.9 (H) 4.8 - 5.6 %    Comment:          Prediabetes: 5.7 - 6.4          Diabetes: >6.4          Glycemic control for adults with diabetes: <7.0   CBC with Differential/Platelet     Status: Abnormal   Collection Time: 06/28/21  6:16 PM  Result Value Ref Range   WBC 9.5 3.4 - 10.8 x10E3/uL   RBC 5.44 4.14 - 5.80 x10E6/uL   Hemoglobin 14.3 13.0 - 17.7 g/dL   Hematocrit 44.8 37.5 - 51.0 %   MCV 82 79 - 97 fL   MCH 26.3 (L) 26.6 - 33.0 pg   MCHC 31.9 31.5 - 35.7 g/dL  RDW 13.6 11.6 - 15.4 %   Platelets 297 150 - 450 x10E3/uL   Neutrophils 65 Not Estab. %   Lymphs 25 Not Estab. %   Monocytes 5 Not Estab. %   Eos 4 Not Estab. %   Basos 1 Not Estab. %   Neutrophils Absolute 6.2 1.4 - 7.0 x10E3/uL   Lymphocytes Absolute 2.4 0.7 - 3.1 x10E3/uL   Monocytes Absolute 0.5 0.1 - 0.9 x10E3/uL   EOS (ABSOLUTE) 0.4 0.0 - 0.4 x10E3/uL   Basophils Absolute 0.1 0.0 - 0.2 x10E3/uL   Immature Granulocytes 0 Not Estab. %   Immature Grans (Abs) 0.0 0.0 - 0.1 x10E3/uL  Comprehensive metabolic panel     Status: Abnormal   Collection Time: 06/28/21  6:16 PM  Result Value Ref Range   Glucose 320 (H) 65 - 99 mg/dL    Comment:                **Effective July 03, 2021 Glucose reference**                  interval will be changing to:                                                             70 - 99     BUN 10 6 - 24 mg/dL   Creatinine, Ser 0.80 0.76 - 1.27 mg/dL   eGFR 113 >59 mL/min/1.73   BUN/Creatinine Ratio 13 9 - 20   Sodium 139 134 - 144 mmol/L   Potassium 3.7 3.5 - 5.2 mmol/L   Chloride 100 96 - 106 mmol/L   CO2 22 20 - 29 mmol/L   Calcium 9.4 8.7 - 10.2 mg/dL   Total Protein 6.9 6.0 - 8.5 g/dL   Albumin 4.0 4.0 - 5.0 g/dL   Globulin, Total 2.9 1.5 - 4.5 g/dL   Albumin/Globulin Ratio 1.4 1.2 - 2.2   Bilirubin Total 0.3 0.0 - 1.2 mg/dL   Alkaline Phosphatase 118 44 - 121 IU/L   AST 9 0 - 40 IU/L   ALT 11 0 - 44 IU/L    Objective: General: Patient is awake, alert, and oriented x 3 and in no acute distress.  Integument: Skin is warm, dry and supple bilateral. Nails are tender, short, thickened and  dystrophic with subungual debris, consistent with onychomycosis, 1-5 bilateral. No signs of infection. No open lesions or preulcerative lesions present bilateral. Remaining integument unremarkable.  Dry skin to heels and lower legs bilateral.  Vasculature:  Dorsalis Pedis pulse 1/4 bilateral. Posterior Tibial pulse 1/4 bilateral.  Capillary fill time <3 sec 1-5 bilateral.  Scant hair growth to the level of the digits. Temperature gradient within normal limits.  Venous hyper pigmentation noted to anterior shins bilateral.  Mild varicosities present bilateral. No current edema present bilateral.   Neurology: Vibratory sensation diminished bilateral.  Worse on right compared to left.  Patient had a history of orthopedic surgery and reports that after that surgery he is always had issues with his nerves on the right foot.  Musculoskeletal: Asymptomatic pes planus pedal deformities noted bilateral. Muscular strength 5/5 in all lower extremity muscular groups bilateral without pain on range of motion . No tenderness with calf compression bilateral.  Assessment and Plan: Problem List Items Addressed This Visit  None Visit Diagnoses     Encounter for comprehensive diabetic foot  examination, type 2 diabetes mellitus ([place])    -  Primary   Dry skin       PVD (peripheral vascular disease) ([place])       Diabetic polyneuropathy associated with type 2 diabetes mellitus ([place])           -Examined patient. -Discussed and educated patient on diabetic foot care, especially with  regards to the vascular, neurological and musculoskeletal systems.  -Stressed the importance of good glycemic control and the detriment of not  controlling glucose levels in relation to the foot. -Advised patient to return next visit once his nails grows out a little more for routine nail care -Answered all patient questions -Patient to return as scheduled for nail care -Patient advised to call the office if any problems or questions arise in the meantime.  Landis Martins, DPM

## 2021-07-28 ENCOUNTER — Other Ambulatory Visit (INDEPENDENT_AMBULATORY_CARE_PROVIDER_SITE_OTHER): Payer: BC Managed Care – PPO

## 2021-07-28 DIAGNOSIS — E785 Hyperlipidemia, unspecified: Secondary | ICD-10-CM

## 2021-07-28 DIAGNOSIS — E1165 Type 2 diabetes mellitus with hyperglycemia: Secondary | ICD-10-CM

## 2021-07-29 LAB — LIPID PANEL W/O CHOL/HDL RATIO
Cholesterol, Total: 169 mg/dL (ref 100–199)
HDL: 34 mg/dL — ABNORMAL LOW (ref >39)
LDL Chol Calc (NIH): 117 mg/dL — ABNORMAL HIGH (ref 0–99)
Triglycerides: 95 mg/dL (ref 0–149)
VLDL Cholesterol Cal: 18 mg/dL (ref 5–40)

## 2021-07-29 LAB — MICROALBUMIN / CREATININE URINE RATIO
Creatinine, Urine: 89 mg/dL
Microalb/Creat Ratio: 3 mg/g creat (ref 0–29)
Microalbumin, Urine: 3 ug/mL

## 2021-08-02 MED ORDER — ATORVASTATIN CALCIUM 20 MG PO TABS: 20.0000 mg | ORAL_TABLET | Freq: Every day | ORAL | 11 refills | Status: DC

## 2021-08-02 NOTE — Telephone Encounter (Signed)
See lab note from 08/02/21 on 10/21/ labs--the Rxs were already sent in

## 2021-08-02 NOTE — Addendum Note (Signed)
Addended by: Marcene Duos on: 08/02/2021 11:03 AM   Modules accepted: Orders

## 2021-08-04 NOTE — Telephone Encounter (Signed)
Pt.notified

## 2021-08-24 ENCOUNTER — Ambulatory Visit: Payer: BC Managed Care – PPO | Admitting: Sports Medicine

## 2021-09-14 ENCOUNTER — Encounter: Payer: Self-pay | Admitting: Sports Medicine

## 2021-09-14 ENCOUNTER — Ambulatory Visit: Payer: BC Managed Care – PPO | Admitting: Sports Medicine

## 2021-09-14 ENCOUNTER — Other Ambulatory Visit: Payer: Self-pay

## 2021-09-14 DIAGNOSIS — B351 Tinea unguium: Secondary | ICD-10-CM | POA: Diagnosis not present

## 2021-09-14 DIAGNOSIS — M79675 Pain in left toe(s): Secondary | ICD-10-CM

## 2021-09-14 DIAGNOSIS — M79674 Pain in right toe(s): Secondary | ICD-10-CM

## 2021-09-14 DIAGNOSIS — E1142 Type 2 diabetes mellitus with diabetic polyneuropathy: Secondary | ICD-10-CM

## 2021-09-14 NOTE — Progress Notes (Signed)
Subjective: Shawn Benson is a 40 y.o. male patient with history of diabetes who presents to office today for diabetic nail care.   FBS not recorded  PCP visit was September.  Patient Active Problem List   Diagnosis Date Noted   Abscessed tooth 07/08/2020   Peripheral edema 04/09/2017   Dyslipidemia 09/14/2016   Sleep apnea 07/10/2016   Tinea corporis 07/10/2016   Tinea pedis 07/10/2016   Abscess of axilla, left 05/11/2016   Onychomycosis of toenail 12/06/2015   Type 2 diabetes mellitus without complication, with long-term current use of insulin ([place]) 10/07/2015   Asthma 10/06/2015   Morbid obesity due to excess calories ([place])    Current Outpatient Medications on File Prior to Visit  Medication Sig Dispense Refill   atorvastatin (LIPITOR) 20 MG tablet Take 1 tablet (20 mg total) by mouth daily. 30 tablet 11   Blood Glucose Monitoring Suppl (ACCU-CHEK AVIVA PLUS) w/Device KIT Check blood sugar before meals 3 times daily 1 kit 0   cetirizine (ZYRTEC) 10 MG tablet Take 10 mg by mouth daily.     Continuous Blood Gluc Receiver (FREESTYLE LIBRE 2 READER) DEVI Check blood glucose 4 times daily before meals and at bedtime. 1 each 11   Continuous Blood Gluc Sensor (FREESTYLE LIBRE 2 SENSOR) MISC Check Blood glucose 4 times daily before each meal and at bedtime 1 each 11   insulin NPH-regular Human (NOVOLIN 70/30) (70-30) 100 UNIT/ML injection Inject 52 Units into the skin 2 (two) times daily with a meal. 52 units subcutaneously twice daily before meals (Patient taking differently: Inject 68 Units into the skin 2 (two) times daily with a meal. 68 units subcutaneously twice daily before meals) 30 mL 11   Insulin Syringe-Needle U-100 (INSULIN SYRINGE .5CC/31GX5/16") 31G X 5/16" 0.5 ML MISC 1 Device by Does not apply route 2 (two) times daily with a meal. 100 each 11   terbinafine (LAMISIL) 250 MG tablet 1 tab by mouth daily for 84 days. 84 tablet 0   No current facility-administered  medications on file prior to visit.   No Known Allergies  Recent Results (from the past 2160 hour(s))  Hgb A1c w/o eAG     Status: Abnormal   Collection Time: 06/28/21  6:16 PM  Result Value Ref Range   Hgb A1c MFr Bld 12.9 (H) 4.8 - 5.6 %    Comment:          Prediabetes: 5.7 - 6.4          Diabetes: >6.4          Glycemic control for adults with diabetes: <7.0   CBC with Differential/Platelet     Status: Abnormal   Collection Time: 06/28/21  6:16 PM  Result Value Ref Range   WBC 9.5 3.4 - 10.8 x10E3/uL   RBC 5.44 4.14 - 5.80 x10E6/uL   Hemoglobin 14.3 13.0 - 17.7 g/dL   Hematocrit 44.8 37.5 - 51.0 %   MCV 82 79 - 97 fL   MCH 26.3 (L) 26.6 - 33.0 pg   MCHC 31.9 31.5 - 35.7 g/dL   RDW 13.6 11.6 - 15.4 %   Platelets 297 150 - 450 x10E3/uL   Neutrophils 65 Not Estab. %   Lymphs 25 Not Estab. %   Monocytes 5 Not Estab. %   Eos 4 Not Estab. %   Basos 1 Not Estab. %   Neutrophils Absolute 6.2 1.4 - 7.0 x10E3/uL   Lymphocytes Absolute 2.4 0.7 - 3.1 x10E3/uL  Monocytes Absolute 0.5 0.1 - 0.9 x10E3/uL   EOS (ABSOLUTE) 0.4 0.0 - 0.4 x10E3/uL   Basophils Absolute 0.1 0.0 - 0.2 x10E3/uL   Immature Granulocytes 0 Not Estab. %   Immature Grans (Abs) 0.0 0.0 - 0.1 x10E3/uL  Comprehensive metabolic panel     Status: Abnormal   Collection Time: 06/28/21  6:16 PM  Result Value Ref Range   Glucose 320 (H) 65 - 99 mg/dL    Comment:                **Effective July 03, 2021 Glucose reference**                  interval will be changing to:                                                             70 - 99    BUN 10 6 - 24 mg/dL   Creatinine, Ser 0.80 0.76 - 1.27 mg/dL   eGFR 113 >59 mL/min/1.73   BUN/Creatinine Ratio 13 9 - 20   Sodium 139 134 - 144 mmol/L   Potassium 3.7 3.5 - 5.2 mmol/L   Chloride 100 96 - 106 mmol/L   CO2 22 20 - 29 mmol/L   Calcium 9.4 8.7 - 10.2 mg/dL   Total Protein 6.9 6.0 - 8.5 g/dL   Albumin 4.0 4.0 - 5.0 g/dL   Globulin, Total 2.9 1.5 - 4.5 g/dL    Albumin/Globulin Ratio 1.4 1.2 - 2.2   Bilirubin Total 0.3 0.0 - 1.2 mg/dL   Alkaline Phosphatase 118 44 - 121 IU/L   AST 9 0 - 40 IU/L   ALT 11 0 - 44 IU/L  Lipid Panel w/o Chol/HDL Ratio     Status: Abnormal   Collection Time: 07/28/21  8:59 AM  Result Value Ref Range   Cholesterol, Total 169 100 - 199 mg/dL   Triglycerides 95 0 - 149 mg/dL   HDL 34 (L) >39 mg/dL   VLDL Cholesterol Cal 18 5 - 40 mg/dL   LDL Chol Calc (NIH) 117 (H) 0 - 99 mg/dL  Microalbumin / creatinine urine ratio     Status: None   Collection Time: 07/28/21  8:59 AM  Result Value Ref Range   Creatinine, Urine 89.0 Not Estab. mg/dL   Microalbumin, Urine 3.0 Not Estab. ug/mL   Microalb/Creat Ratio 3 0 - 29 mg/g creat    Comment:                        Normal:                0 -  29                        Moderately increased: 30 - 300                        Severely increased:       >300     Objective: General: Patient is awake, alert, and oriented x 3 and in no acute distress.  Integument: Skin is warm, dry and supple bilateral. Nails are tender, elongated, thickened and  dystrophic with subungual debris, consistent with onychomycosis, 1-5 bilateral. No  signs of infection. No open lesions or preulcerative lesions present bilateral. Remaining integument unremarkable.  Dry skin to heels and lower legs bilateral.  Vasculature:  Dorsalis Pedis pulse 1/4 bilateral. Posterior Tibial pulse 1/4 bilateral.  Capillary fill time <3 sec 1-5 bilateral.  Scant hair growth to the level of the digits. Temperature gradient within normal limits.  Venous hyper pigmentation noted to anterior shins bilateral.  Mild varicosities present bilateral. No current edema present bilateral.   Neurology: Vibratory sensation diminished bilateral.  Worse on right compared to left, postsurgical unchanged from prior.  Musculoskeletal: Asymptomatic pes planus pedal deformities noted bilateral. Muscular strength 5/5 in all lower extremity  muscular groups bilateral without pain on range of motion . No tenderness with calf compression bilateral.  Assessment and Plan: Problem List Items Addressed This Visit   None Visit Diagnoses     Pain due to onychomycosis of toenails of both feet    -  Primary   Diabetic polyneuropathy associated with type 2 diabetes mellitus ([place])           -Examined patient. -Discussed and educated patient on diabetic foot care, especially with  regards to the vascular, neurological and musculoskeletal systems.  -Mechanically debrided nails x 10 using a sterile nail nipper -Return in 3 months for nail care -Patient advised to call the office if any problems or questions arise in the meantime.  Landis Martins, DPM

## 2021-09-28 ENCOUNTER — Other Ambulatory Visit: Payer: BC Managed Care – PPO

## 2021-10-06 ENCOUNTER — Other Ambulatory Visit (INDEPENDENT_AMBULATORY_CARE_PROVIDER_SITE_OTHER): Payer: BC Managed Care – PPO

## 2021-10-06 ENCOUNTER — Other Ambulatory Visit: Payer: Self-pay

## 2021-10-06 DIAGNOSIS — E785 Hyperlipidemia, unspecified: Secondary | ICD-10-CM

## 2021-10-06 DIAGNOSIS — E1165 Type 2 diabetes mellitus with hyperglycemia: Secondary | ICD-10-CM | POA: Diagnosis not present

## 2021-10-07 LAB — HEPATIC FUNCTION PANEL
ALT: 12 IU/L (ref 0–44)
AST: 10 IU/L (ref 0–40)
Albumin: 4.1 g/dL (ref 4.0–5.0)
Alkaline Phosphatase: 128 IU/L — ABNORMAL HIGH (ref 44–121)
Bilirubin Total: 0.2 mg/dL (ref 0.0–1.2)
Bilirubin, Direct: 0.14 mg/dL (ref 0.00–0.40)
Total Protein: 6.6 g/dL (ref 6.0–8.5)

## 2021-10-07 LAB — LIPID PANEL W/O CHOL/HDL RATIO
Cholesterol, Total: 160 mg/dL (ref 100–199)
HDL: 32 mg/dL — ABNORMAL LOW (ref >39)
LDL Chol Calc (NIH): 101 mg/dL — ABNORMAL HIGH (ref 0–99)
Triglycerides: 150 mg/dL — ABNORMAL HIGH (ref 0–149)
VLDL Cholesterol Cal: 27 mg/dL (ref 5–40)

## 2021-10-30 ENCOUNTER — Encounter: Payer: BC Managed Care – PPO | Admitting: Internal Medicine

## 2021-12-14 ENCOUNTER — Encounter: Payer: Self-pay | Admitting: Sports Medicine

## 2021-12-14 ENCOUNTER — Other Ambulatory Visit: Payer: Self-pay

## 2021-12-14 ENCOUNTER — Ambulatory Visit (INDEPENDENT_AMBULATORY_CARE_PROVIDER_SITE_OTHER): Payer: BC Managed Care – PPO | Admitting: Sports Medicine

## 2021-12-14 DIAGNOSIS — M79675 Pain in left toe(s): Secondary | ICD-10-CM

## 2021-12-14 DIAGNOSIS — B351 Tinea unguium: Secondary | ICD-10-CM | POA: Diagnosis not present

## 2021-12-14 DIAGNOSIS — L853 Xerosis cutis: Secondary | ICD-10-CM

## 2021-12-14 DIAGNOSIS — M79674 Pain in right toe(s): Secondary | ICD-10-CM

## 2021-12-14 DIAGNOSIS — M2142 Flat foot [pes planus] (acquired), left foot: Secondary | ICD-10-CM

## 2021-12-14 DIAGNOSIS — E1142 Type 2 diabetes mellitus with diabetic polyneuropathy: Secondary | ICD-10-CM

## 2021-12-14 DIAGNOSIS — M2141 Flat foot [pes planus] (acquired), right foot: Secondary | ICD-10-CM

## 2021-12-14 DIAGNOSIS — I739 Peripheral vascular disease, unspecified: Secondary | ICD-10-CM

## 2021-12-14 MED ORDER — GABAPENTIN 300 MG PO CAPS: 300.0000 mg | ORAL_CAPSULE | Freq: Every day | ORAL | 3 refills | Status: DC

## 2021-12-14 NOTE — Progress Notes (Signed)
Subjective: ?Shawn Benson is a 41 y.o. male patient with history of diabetes who presents to office today for diabetic nail care.  Reports that he is doing good however his neuropathy pains have been bothering him a little bit more sharp shooting tingling burning to the feet also states that he has chronic ankle pain but feels better when he wears a brace and is trying to find shoes that could possibly give him some support.  Patient reports that he is trying to do the best he can with taking care of himself as well as utilizing his insurance to help get him the things that he needs. ? ?FBS not recorded ?A1c not sure ?Mack Hook, MD, PCP visit last fall ? ?Patient Active Problem List  ? Diagnosis Date Noted  ? Abscessed tooth 07/08/2020  ? Peripheral edema 04/09/2017  ? Dyslipidemia 09/14/2016  ? Sleep apnea 07/10/2016  ? Tinea corporis 07/10/2016  ? Tinea pedis 07/10/2016  ? Abscess of axilla, left 05/11/2016  ? Onychomycosis of toenail 12/06/2015  ? Type 2 diabetes mellitus without complication, with long-term current use of insulin ([place]) 10/07/2015  ? Asthma 10/06/2015  ? Morbid obesity due to excess calories ([place])   ? ?Current Outpatient Medications on File Prior to Visit  ?Medication Sig Dispense Refill  ? atorvastatin (LIPITOR) 20 MG tablet Take 1 tablet (20 mg total) by mouth daily. 30 tablet 11  ? Blood Glucose Monitoring Suppl (ACCU-CHEK AVIVA PLUS) w/Device KIT Check blood sugar before meals 3 times daily 1 kit 0  ? cetirizine (ZYRTEC) 10 MG tablet Take 10 mg by mouth daily.    ? Continuous Blood Gluc Receiver (FREESTYLE LIBRE 2 READER) DEVI Check blood glucose 4 times daily before meals and at bedtime. 1 each 11  ? Continuous Blood Gluc Sensor (FREESTYLE LIBRE 2 SENSOR) MISC Check Blood glucose 4 times daily before each meal and at bedtime 1 each 11  ? insulin NPH-regular Human (NOVOLIN 70/30) (70-30) 100 UNIT/ML injection Inject 52 Units into the skin 2 (two) times daily with a meal. 52  units subcutaneously twice daily before meals (Patient taking differently: Inject 68 Units into the skin 2 (two) times daily with a meal. 68 units subcutaneously twice daily before meals) 30 mL 11  ? Insulin Syringe-Needle U-100 (INSULIN SYRINGE .5CC/31GX5/16") 31G X 5/16" 0.5 ML MISC 1 Device by Does not apply route 2 (two) times daily with a meal. 100 each 11  ? terbinafine (LAMISIL) 250 MG tablet 1 tab by mouth daily for 84 days. 84 tablet 0  ? ?No current facility-administered medications on file prior to visit.  ? ?No Known Allergies ? ?Recent Results (from the past 2160 hour(s))  ?Lipid Panel w/o Chol/HDL Ratio     Status: Abnormal  ? Collection Time: 10/06/21  9:56 AM  ?Result Value Ref Range  ? Cholesterol, Total 160 100 - 199 mg/dL  ? Triglycerides 150 (H) 0 - 149 mg/dL  ? HDL 32 (L) >39 mg/dL  ? VLDL Cholesterol Cal 27 5 - 40 mg/dL  ? LDL Chol Calc (NIH) 101 (H) 0 - 99 mg/dL  ?Hepatic function panel     Status: Abnormal  ? Collection Time: 10/06/21  9:56 AM  ?Result Value Ref Range  ? Total Protein 6.6 6.0 - 8.5 g/dL  ? Albumin 4.1 4.0 - 5.0 g/dL  ? Bilirubin Total 0.2 0.0 - 1.2 mg/dL  ? Bilirubin, Direct 0.14 0.00 - 0.40 mg/dL  ? Alkaline Phosphatase 128 (H) 44 - 121 IU/L  ?  AST 10 0 - 40 IU/L  ? ALT 12 0 - 44 IU/L  ? ? ?Objective: ?General: Patient is awake, alert, and oriented x 3 and in no acute distress. ? ?Integument: Skin is warm, dry and supple bilateral. Nails are tender, elongated, thickened and dystrophic with subungual debris, consistent with onychomycosis, 1-5 bilateral. No signs of infection. No open lesions or preulcerative lesions present bilateral. Remaining integument unremarkable.  Dry skin to heels and lower legs bilateral unchanged from prior. ? ?Vasculature:  Dorsalis Pedis pulse 1/4 bilateral. Posterior Tibial pulse 1/4 bilateral.  ?Capillary fill time <3 sec 1-5 bilateral.  Scant hair growth to the level of the digits.Temperature gradient within normal limits.  Venous hyper  pigmentation noted to anterior shins bilateral.  Mild varicosities present bilateral. No current edema present bilateral as previously noted.  ? ?Neurology: Vibratory sensation diminished bilateral.  Worse on right compared to left, postsurgical unchanged from prior with history of neuropathy. ? ?Musculoskeletal: Asymptomatic pes planus pedal deformities noted bilateral right greater than left. Muscular strength 5/5 in all lower extremity muscular groups bilateral without pain on range of motion . No tenderness with calf compression bilateral. ? ?Assessment and Plan: ?Problem List Items Addressed This Visit   ?None ?Visit Diagnoses   ? ? Pain due to onychomycosis of toenails of both feet    -  Primary  ? Diabetic polyneuropathy associated with type 2 diabetes mellitus ([place])      ? Relevant Medications  ? gabapentin (NEURONTIN) 300 MG capsule  ? Dry skin      ? PVD (peripheral vascular disease) ([place])      ? Pes planus of both feet      ? ?  ? ?-Examined patient. ?-Re-Discussed and educated patient on diabetic foot care, especially with  ?regards to the vascular, neurological and musculoskeletal systems.  ?-Recommend patient to try over-the-counter Nervive nerve relief supplement if this does not work he may start taking gabapentin as I have prescribed this visit 300 mg at bedtime to see if this will help with his neuropathy pains if his neuropathy continues to worsen may benefit from a referral to neurology ?-Discussed with patient shoe choices to support ankle and advised patient to try a hightop shoe or hiking boot ?-Patient will be a good candidate for custom orthotics and bracing especially on the right since his pes planus deformity is much more severe right greater than left; office will check his orthotic coverage/benefits and we will schedule him for an appointment with Aaron Edelman if he wants to proceed with getting them ?-Discussed treatment options for elongated and thickened nails ?-Mechanically debrided  painful nails x 10 using a sterile nail nipper ?-Encouraged good hygiene habits and daily skin emollients for dry skin ?-Return in 3 months for diabetic nail care or when ready for custom orthotic/brace sooner if problems or issues arise. ? ?Landis Martins, DPM ? ?

## 2021-12-14 NOTE — Patient Instructions (Signed)
Nervive nerve relief supplement for your nerves can be purchased OTC at [facility] ? ?Get Hiking boots from [employer] to help with your ankle ?

## 2022-03-19 ENCOUNTER — Encounter: Payer: Self-pay | Admitting: Podiatry

## 2022-03-19 ENCOUNTER — Ambulatory Visit (INDEPENDENT_AMBULATORY_CARE_PROVIDER_SITE_OTHER): Payer: BC Managed Care – PPO | Admitting: Podiatry

## 2022-03-19 DIAGNOSIS — M2142 Flat foot [pes planus] (acquired), left foot: Secondary | ICD-10-CM | POA: Diagnosis not present

## 2022-03-19 DIAGNOSIS — M79675 Pain in left toe(s): Secondary | ICD-10-CM

## 2022-03-19 DIAGNOSIS — E1142 Type 2 diabetes mellitus with diabetic polyneuropathy: Secondary | ICD-10-CM | POA: Diagnosis not present

## 2022-03-19 DIAGNOSIS — M79674 Pain in right toe(s): Secondary | ICD-10-CM

## 2022-03-19 DIAGNOSIS — M2141 Flat foot [pes planus] (acquired), right foot: Secondary | ICD-10-CM | POA: Diagnosis not present

## 2022-03-19 DIAGNOSIS — I739 Peripheral vascular disease, unspecified: Secondary | ICD-10-CM

## 2022-03-19 DIAGNOSIS — B351 Tinea unguium: Secondary | ICD-10-CM

## 2022-03-24 NOTE — Progress Notes (Signed)
  Subjective:  Patient ID: Shawn Benson, male    DOB: Sep 21, 1981,  MRN: [id number]  Shawn Benson presents to clinic today for at risk footcare. Patient has h/o diabetes, neuropathy and PAD and is seen for  and painful elongated mycotic toenails 1-5 bilaterally which are tender when wearing enclosed shoe gear. Pain is relieved with periodic professional debridement.  Last known HgA1c was about 12%.  Patient did not check blood glucose today.  New problem(s): None.   PCP is Julieanne Manson, MD , and last visit was June 28, 2021.  No Known Allergies  Review of Systems: Negative except as noted in the HPI.  Objective: No changes noted in today's physical examination. Vascular Examination: CFT <3 seconds b/l. DP /PT pulses 1/4 bilaterally. Digital hair absent. Skin temperature gradient warm to warm b/l. No ischemia or gangrene. No cyanosis or clubbing noted b/l. Trace edema noted BLE. Varicosities present b/l.   Neurological Examination: Pt has subjective symptoms of neuropathy. Protective sensation decreased with 10 gram monofilament b/l. Vibratory sensation diminished b/l.  Dermatological Examination: Pedal skin is warm and supple b/l LE. No open wounds b/l LE. No interdigital macerations noted b/l LE. Toenails 1-5 b/l elongated, discolored, dystrophic, thickened, crumbly with subungual debris and tenderness to dorsal palpation. No hyperkeratotic nor porokeratotic lesions present on today's visit.  Musculoskeletal Examination: Muscle strength 5/5 to b/l LE. Muscle strength 5/5 to all lower extremity muscle groups bilaterally. Pes planus deformity noted left lower extremity. Pes planovalgus deformity noted right lower extremity. Patient ambulates independent of any assistive aids.  Radiographs: None  Last A1c:      Latest Ref Rng & Units 06/28/2021    6:16 PM  Hemoglobin A1C  Hemoglobin-A1c 4.8 - 5.6 % 12.9    Assessment/Plan: 1. Pain due to onychomycosis of toenails  of both feet   2. Acquired pes planus of left foot   3. Acquired pes planovalgus of right foot   4. PAD (peripheral artery disease) ([facility])   5. Diabetic polyneuropathy associated with type 2 diabetes mellitus ([facility])      -Patient was evaluated and treated. All patient's and/or POA's questions/concerns answered on today's visit. -He has discussed orthotic therapy for his flat feet with Dr. Marylene Land on previous visit. I recommend he proceed with orthotic therapy to prevent continued deforming forces of pronation in presence of diabetes with neuropathy. His right foot is more severe than the left foot. I have given him the codes to discuss with his insurance company to determine coverage CPT codes [phone] and [phone] for diagnoses of pes planovalgus of right foot, pes planus of left foot and diabetes with polyneuropathy. -Mycotic toenails 1-5 bilaterally were debrided in length and girth with sterile nail nippers and dremel without incident. -Patient/POA to call should there be question/concern in the interim.   Return in about 3 months (around 06/19/2022).  Freddie Breech, DPM

## 2022-06-25 ENCOUNTER — Ambulatory Visit (INDEPENDENT_AMBULATORY_CARE_PROVIDER_SITE_OTHER): Payer: BC Managed Care – PPO | Admitting: Podiatry

## 2022-06-25 DIAGNOSIS — Z91199 Patient's noncompliance with other medical treatment and regimen due to unspecified reason: Secondary | ICD-10-CM

## 2022-06-25 NOTE — Progress Notes (Signed)
1. No-show for appointment     

## 2022-06-26 ENCOUNTER — Encounter: Payer: Self-pay | Admitting: Podiatry

## 2022-06-26 ENCOUNTER — Ambulatory Visit (INDEPENDENT_AMBULATORY_CARE_PROVIDER_SITE_OTHER): Payer: BC Managed Care – PPO | Admitting: Podiatry

## 2022-06-26 DIAGNOSIS — M2141 Flat foot [pes planus] (acquired), right foot: Secondary | ICD-10-CM | POA: Diagnosis not present

## 2022-06-26 DIAGNOSIS — M79675 Pain in left toe(s): Secondary | ICD-10-CM | POA: Diagnosis not present

## 2022-06-26 DIAGNOSIS — B353 Tinea pedis: Secondary | ICD-10-CM

## 2022-06-26 DIAGNOSIS — B351 Tinea unguium: Secondary | ICD-10-CM

## 2022-06-26 DIAGNOSIS — M79674 Pain in right toe(s): Secondary | ICD-10-CM

## 2022-06-26 DIAGNOSIS — E1142 Type 2 diabetes mellitus with diabetic polyneuropathy: Secondary | ICD-10-CM

## 2022-06-26 DIAGNOSIS — S91115A Laceration without foreign body of left lesser toe(s) without damage to nail, initial encounter: Secondary | ICD-10-CM | POA: Diagnosis not present

## 2022-06-26 DIAGNOSIS — R600 Localized edema: Secondary | ICD-10-CM

## 2022-06-26 MED ORDER — KETOCONAZOLE 2 % EX CREA: TOPICAL_CREAM | CUTANEOUS | 1 refills | Status: DC

## 2022-06-26 NOTE — Patient Instructions (Addendum)
Edema  Edema is an abnormal buildup of fluids in the body tissues and under the skin. Swelling of the legs, feet, and ankles is a common symptom that becomes more likely as you get older. Swelling is also common in looser tissues, such as around the eyes. Pressing on the area may make a temporary dent in your skin (pitting edema). This fluid may also accumulate in your lungs (pulmonary edema). There are many possible causes of edema. Eating too much salt (sodium) and being on your feet or sitting for a long time can cause edema in your legs, feet, and ankles. Common causes of edema include: Certain medical conditions, such as heart failure, liver or kidney disease, and cancer. Weak leg blood vessels. An injury. Pregnancy. Medicines. Being obese. Low protein levels in the blood. Hot weather may make edema worse. Edema is usually painless. Your skin may look swollen or shiny. Follow these instructions at home: Medicines Take over-the-counter and prescription medicines only as told by your health care provider. Your health care provider may prescribe a medicine to help your body get rid of extra water (diuretic). Take this medicine if you are told to take it. Eating and drinking Eat a low-salt (low-sodium) diet to reduce fluid as told by your health care provider. Sometimes, eating less salt may reduce swelling. Depending on the cause of your swelling, you may need to limit how much fluid you drink (fluid restriction). General instructions Raise (elevate) the injured area above the level of your heart while you are sitting or lying down. Do not sit still or stand for long periods of time. Do not wear tight clothing. Do not wear garters on your upper legs. Exercise your legs to get your circulation going. This helps to move the fluid back into your blood vessels, and it may help the swelling go down. Wear compression stockings as told by your health care provider. These stockings help to prevent  blood clots and reduce swelling in your legs. It is important that these are the correct size. These stockings should be prescribed by your health care provider to prevent possible injuries. If elastic bandages or wraps are recommended, use them as told by your health care provider. Contact a health care provider if: Your edema does not get better with treatment. You have heart, liver, or kidney disease and have symptoms of edema. You have sudden and unexplained weight gain. Get help right away if: You develop shortness of breath or chest pain. You cannot breathe when you lie down. You develop pain, redness, or warmth in the swollen areas. You have heart, liver, or kidney disease and suddenly get edema. You have a fever and your symptoms suddenly get worse. These symptoms may be an emergency. Get help right away. Call 911. Do not wait to see if the symptoms will go away. Do not drive yourself to the hospital. Summary Edema is an abnormal buildup of fluids in the body tissues and under the skin. Eating too much salt (sodium)and being on your feet or sitting for a long time can cause edema in your legs, feet, and ankles. Raise (elevate) the injured area above the level of your heart while you are sitting or lying down. Follow your health care provider's instructions about diet and how much fluid you can drink. This information is not intended to replace advice given to you by your health care provider. Make sure you discuss any questions you have with your health care provider. Document Revised: 05/29/2021 Document   Reviewed: 05/29/2021 Elsevier Patient Education  2023 [employer]   Athlete's Foot Athlete's foot (tinea pedis) is a fungal infection of the skin on your feet. It often occurs on the skin that is between or underneath the toes. It can also occur on the soles of your feet. The infection can spread from person to person (is contagious). It can also spread when a person's bare feet  come in contact with the fungus on shower floors or on items such as shoes. What are the causes? This condition is caused by a fungus that grows in warm, moist places. You can get athlete's foot by sharing shoes, shower stalls, towels, and wet floors with someone who is infected. Not washing your feet or changing your socks often enough can also lead to athlete's foot. What increases the risk? This condition is more likely to develop in: Men. People who have a weak body defense system (immune system). People who have diabetes. People who use public showers, such as at a gym. People who wear heavy-duty shoes, such as Youth worker. Seasons with warm, humid weather. What are the signs or symptoms? Symptoms of this condition include: Itchy areas between your toes or on the soles of your feet. White, flaky, or scaly areas between your toes or on the soles of your feet. Very itchy small blisters between your toes or on the soles of your feet. Small cuts in your skin. These cuts can become infected. Thick or discolored toenails. How is this diagnosed? This condition may be diagnosed with a physical exam and a review of your medical history. Your health care provider may also take a skin or toenail sample to examine under a microscope. How is this treated? This condition is treated with antifungal medicines. These may be applied as powders, ointments, or creams. In severe cases, an oral antifungal medicine may be given. Follow these instructions at home: Medicines Apply or take over-the-counter and prescription medicines only as told by your health care provider. Apply your antifungal medicine as told by your health care provider. Do not stop using the antifungal even if your condition improves. Foot care Do not scratch your feet. Keep your feet dry: Wear cotton or wool socks. Change your socks every day or if they become wet. Wear shoes that allow air to flow, such as sandals  or canvas tennis shoes. Wash and dry your feet, including the area between your toes. Also, wash and dry your feet: Every day or as told by your health care provider. After exercising. General instructions Do not let others use towels, shoes, nail clippers, or other personal items that touch your feet. Protect your feet by wearing sandals in wet areas, such as locker rooms and shared showers. Keep all follow-up visits. This is important. If you have diabetes, keep your blood sugar under control. Contact a health care provider if: You have a fever. You have swelling, soreness, warmth, or redness in your foot. Your feet are not getting better with treatment. Your symptoms get worse. You have new symptoms. You have severe pain. Summary Athlete's foot (tinea pedis) is a fungal infection of the skin on your feet. It often occurs on skin that is between or underneath the toes. This condition is caused by a fungus that grows in warm, moist places. Symptoms include white, flaky, or scaly areas between your toes or on the soles of your feet. This condition is treated with antifungal medicines. Keep your feet clean. Always dry them thoroughly.  This information is not intended to replace advice given to you by your health care provider. Make sure you discuss any questions you have with your health care provider. Document Revised: 01/15/2021 Document Reviewed: 01/15/2021 Elsevier Patient Education  2023 [employer]  Diabetic Neuropathy Diabetic neuropathy refers to nerve damage that is caused by diabetes. Over time, people with diabetes can develop nerve damage throughout the body. There are several types of diabetic neuropathy: Peripheral neuropathy. This is the most common type of diabetic neuropathy. It damages the nerves that carry signals between the spinal cord and other parts of the body (peripheral nerves). This usually affects nerves in the feet, legs, hands, and arms. Autonomic  neuropathy. This type causes damage to nerves that control involuntary functions (autonomic nerves). Involuntary functions are functions of the body that you do not control. They include heartbeat, body temperature, blood pressure, urination, digestion, sweating, sexual function, or response to changes in blood glucose. Focal neuropathy. This type of nerve damage affects one area of the body, such as an arm, a leg, or the face. The injury may involve one nerve or a small group of nerves. Focal neuropathy can be painful and unpredictable. It occurs most often in older adults with diabetes. This often develops suddenly, but usually improves over time and does not cause long-term problems. Proximal neuropathy. This type of nerve damage affects the nerves of the thighs, hips, buttocks, or legs. It causes severe pain, weakness, and muscle death (atrophy), usually in the thigh muscles. It is more common among older men and people who have type 2 diabetes. The length of recovery time may vary. What are the causes? Peripheral, autonomic, and focal neuropathies are caused by diabetes that is not well controlled with treatment. The cause of proximal neuropathy is not known, but it may be caused by inflammation related to uncontrolled blood glucose levels. What are the signs or symptoms? Peripheral neuropathy Peripheral neuropathy develops slowly over time. When the nerves of the feet and legs no longer work, you may experience: Burning, stabbing, or aching pain in the legs or feet. Pain or cramping in the legs or feet. Loss of feeling (numbness) and inability to feel pressure or pain in the feet. This can lead to: Thick calluses or sores on areas of constant pressure. Ulcers. Reduced ability to feel temperature changes. Foot deformities. Muscle weakness. Loss of balance or coordination. Autonomic neuropathy The symptoms of autonomic neuropathy vary depending on which nerves are affected. Symptoms may  include: Problems with digestion, such as: Nausea or vomiting. Poor appetite. Bloating. Diarrhea or constipation. Trouble swallowing. Losing weight without trying to. Problems with the heart, blood, and lungs, such as: Dizziness, especially when standing up. Fainting. Shortness of breath. Irregular heartbeat. Bladder problems, such as: Trouble starting or stopping urination. Leaking urine. Trouble emptying the bladder. Urinary tract infections (UTIs). Problems with other body functions, such as: Sweat. You may sweat too much or too little. Temperature. You might get hot easily. Or, you might feel cold more than usual. Sexual function. Men may not be able to get or maintain an erection. Women may have vaginal dryness and difficulty with arousal. Focal neuropathy Symptoms affect only one area of the body. Common symptoms include: Numbness. Tingling. Burning pain. Prickling feeling. Very sensitive skin. Weakness. Inability to move (paralysis). Muscle twitching. Muscles getting smaller (wasting). Poor coordination. Double or blurred vision. Proximal neuropathy Sudden, severe pain in the hip, thigh, or buttocks. Pain may spread from the back into the legs (sciatica). Pain  and numbness in the arms and legs. Tingling. Loss of bladder control or bowel control. Weakness and wasting of thigh muscles. Difficulty getting up from a seated position. Abdominal swelling. Unexplained weight loss. How is this diagnosed? Diagnosis varies depending on the type of neuropathy your health care provider suspects. Peripheral neuropathy Your health care provider will do a neurologic exam. This exam checks your reflexes, how you move, and what you can feel. You may have other tests, such as: Blood tests. Tests of the fluid that surrounds the spinal cord (lumbar puncture). CT scan. MRI. Checking the nerves that control muscles (electromyogram, or EMG). Checking how quickly signals pass  through your nerves (nerve conduction study). Checking a small piece of a nerve using a microscope (biopsy). Autonomic neuropathy You may have tests, such as: Tests to measure your blood pressure and heart rate. You may be secured to an exam table that moves you from a lying position to an upright position (table tilt test). Breathing tests to check your lungs. Tests to check how food moves through the digestive system (gastric emptying tests). Blood, sweat, or urine tests. Ultrasound of your bladder. Spinal fluid tests. Focal neuropathy This condition may be diagnosed with: A neurologic exam. CT scan. MRI. EMG. Nerve conduction study. Proximal neuropathy There is no test to diagnose this type of neuropathy. You may have tests to rule out other possible causes of this type of neuropathy. Tests may include: X-rays of your spine and lumbar region. Lumbar puncture. MRI. How is this treated? The goal of treatment is to keep nerve damage from getting worse. Treatment may include: Following your diabetes management plan. This will help keep your blood glucose level and your A1C level within your target range. This is the most important treatment. Using prescription pain medicine. Follow these instructions at home: Diabetes management Follow your diabetes management plan as told by your health care provider. Check your blood glucose levels. Keep your blood glucose in your target range. Have your A1C level checked at least two times a year, or as often as told. Take over the counter and prescription medicines only as told by your health care provider. This includes insulin and diabetes medicine.  Lifestyle  Do not use any products that contain nicotine or tobacco, such as cigarettes, e-cigarettes, and chewing tobacco. If you need help quitting, ask your health care provider. Be physically active every day. Include strength training and balance exercises. Follow a healthy meal  plan. Work with your health care provider to manage your blood pressure. General instructions Ask your health care provider if the medicine prescribed to you requires you to avoid driving or using machinery. Check your skin and feet every day for cuts, bruises, redness, blisters, or sores. Keep all follow-up visits. This is important. Contact a health care provider if: You have burning, stabbing, or aching pain in your legs or feet. You are unable to feel pressure or pain in your feet. You develop problems with digestion, such as: Nausea. Vomiting. Bloating. Constipation. Diarrhea. Abdominal pain. You have difficulty with urination, such as: Inability to control when you urinate (incontinence). Inability to completely empty the bladder (retention). You feel as if your heart is racing (palpitations). You feel dizzy, weak, or faint when you stand up. Get help right away if: You cannot urinate. You have sudden weakness or loss of coordination. You have trouble speaking. You have pain or pressure in your chest. You have an irregular heartbeat. You have sudden inability to move a part  of your body. These symptoms may represent a serious problem that is an emergency. Do not wait to see if the symptoms will go away. Get medical help right away. Call your local emergency services (911 in the [place] Do not drive yourself to the hospital. Summary Diabetic neuropathy is nerve damage that is caused by diabetes. It can cause numbness and pain in the arms, legs, digestive tract, heart, and other body systems. This condition is treated by keeping your blood glucose level and your A1C level within your target range. This can help prevent neuropathy from getting worse. Check your skin and feet every day for cuts, bruises, redness, blisters, or sores. Do not use any products that contain nicotine or tobacco, such as cigarettes, e-cigarettes, and chewing tobacco. This information is not intended to  replace advice given to you by your health care provider. Make sure you discuss any questions you have with your health care provider. Document Revised: 02/04/2020 Document Reviewed: 02/04/2020 Elsevier Patient Education  [place].

## 2022-07-01 NOTE — Progress Notes (Signed)
Subjective:  Patient ID: Shawn Benson, male    DOB: 02-01-1981,  MRN: [id number]  Shawn Benson presents to clinic today for at risk footcare. Patient has h/o diabetes, neuropathy and PAD and is seen for  and painful thick toenails that are difficult to trim. Pain interferes with ambulation. Aggravating factors include wearing enclosed shoe gear. Pain is relieved with periodic professional debridement.  Patient states he attempted to trim his toenails and cut his left 3rd toe and it bled some. He denies any redness, drainage or swelling. It is not painful. Digit is healing.  Patient states he does not understand why he needs to continue coming to his podiatry appointments. He states his feet have been swelling more as of late. Denies any pain or calf tightness in legs.  Last known  HgA1c was 12.2%. Patient does not monitor blood glucose daily.  New problem(s): None.   PCP is Julieanne Manson, MD , and last visit was  June 28, 2021.  No Known Allergies  Review of Systems: Negative except as noted in the HPI.  Objective: No changes noted in today's physical examination.  Shawn P [name] is a pleasant 41 y.o. male in NAD. AAO x 3. Vascular Examination: CFT <3 seconds b/l. DP /PT pulses 1/4 bilaterally. Digital hair absent. Skin temperature gradient warm to warm b/l. No ischemia or gangrene. No cyanosis or clubbing noted b/l. No pain with calf compression. Trace edema noted BLE. Varicosities present b/l.   Neurological Examination: Pt has subjective symptoms of neuropathy. Protective sensation decreased with 10 gram monofilament b/l. Vibratory sensation diminished b/l.  Dermatological Examination: Pedal skin is warm and supple b/l LE. No open wounds b/l LE. No interdigital macerations noted b/l LE. Toenails 1-5 b/l elongated, discolored, dystrophic, thickened, crumbly with subungual debris and tenderness to dorsal palpation. No hyperkeratotic nor porokeratotic lesions  present on today's visit. Healing abrasion(s) with intact scab noted distal tip of left 3rd toe. No erythema, no edema, no drainage, no fluctuance.  Diffuse scaling noted peripherally and plantarly b/l feet.  No interdigital macerations.  No blisters, no weeping. No signs of secondary bacterial infection noted.   Musculoskeletal Examination: Muscle strength 5/5 to b/l LE. Muscle strength 5/5 to all lower extremity muscle groups bilaterally. Pes planus deformity noted left lower extremity. Pes planovalgus deformity noted right lower extremity. Patient ambulates independent of any assistive aids.  Radiographs: None  Assessment/Plan: 1. Pain due to onychomycosis of toenails of both feet   2. Laceration of third toe of left foot, initial encounter   3. Tinea pedis of both feet   4. Localized edema   5. Acquired pes planovalgus of right foot   6. Diabetic polyneuropathy associated with type 2 diabetes mellitus (HCC)   -Examined patient. -Will attempt to get pre-auth on diabetic shoes and inserts for his flat foot deformity. -Stressed the importance of good glycemic control and the detriment of not  controlling glucose levels in relation to the foot. Discussed the need for periodic surveillance of his feet with uncontrolled diabetes. -Discussed pedal edema and the various causes. I have asked him to discuss more with his PCP. Dispensed information on edema, neuropathy and athlete's feet today. -Patient/POA educated on dangers of using sharp instrumentation on toes/feet. Recommended continued professional foot care in presence of diabetes and neuropathy. Patient/POA relates understanding. -Toenails 1-5 b/l were debrided in length and girth with sterile nail nippers and dremel without iatrogenic bleeding.  -For tinea pedis, Rx sent to pharmacy for Ketoconazole Cream  2% to be applied once daily for six weeks. -Patient/POA to call should there be question/concern in the interim.   Return in about 3  months (around 09/25/2022).  Marzetta Board, DPM

## 2022-07-06 NOTE — Progress Notes (Signed)
I am submitting the auth to [facility] but in the event they do not approve it, I will see if they will cover therapeutic shoes and orthotics.

## 2022-07-16 ENCOUNTER — Emergency Department (HOSPITAL_COMMUNITY)
Admission: EM | Admit: 2022-07-16 | Discharge: 2022-07-16 | Disposition: A | Discharge status: Home / Self Care | Payer: BC Managed Care – PPO | Attending: Emergency Medicine | Admitting: Emergency Medicine

## 2022-07-16 ENCOUNTER — Emergency Department (HOSPITAL_COMMUNITY): Payer: BC Managed Care – PPO

## 2022-07-16 ENCOUNTER — Encounter (HOSPITAL_COMMUNITY): Payer: Self-pay

## 2022-07-16 ENCOUNTER — Other Ambulatory Visit: Payer: Self-pay

## 2022-07-16 DIAGNOSIS — R079 Chest pain, unspecified: Secondary | ICD-10-CM

## 2022-07-16 DIAGNOSIS — J45909 Unspecified asthma, uncomplicated: Secondary | ICD-10-CM | POA: Insufficient documentation

## 2022-07-16 DIAGNOSIS — R600 Localized edema: Secondary | ICD-10-CM | POA: Diagnosis not present

## 2022-07-16 DIAGNOSIS — E876 Hypokalemia: Secondary | ICD-10-CM | POA: Insufficient documentation

## 2022-07-16 DIAGNOSIS — Z794 Long term (current) use of insulin: Secondary | ICD-10-CM | POA: Diagnosis not present

## 2022-07-16 DIAGNOSIS — E1165 Type 2 diabetes mellitus with hyperglycemia: Secondary | ICD-10-CM | POA: Insufficient documentation

## 2022-07-16 DIAGNOSIS — R0789 Other chest pain: Secondary | ICD-10-CM | POA: Insufficient documentation

## 2022-07-16 LAB — CBC WITH DIFFERENTIAL/PLATELET
Abs Immature Granulocytes: 0.02 10*3/uL (ref 0.00–0.07)
Basophils Absolute: 0 10*3/uL (ref 0.0–0.1)
Basophils Relative: 0 %
Eosinophils Absolute: 0.3 10*3/uL (ref 0.0–0.5)
Eosinophils Relative: 3 %
HCT: 41.9 % (ref 39.0–52.0)
Hemoglobin: 13.3 g/dL (ref 13.0–17.0)
Immature Granulocytes: 0 %
Lymphocytes Relative: 24 %
Lymphs Abs: 2.4 10*3/uL (ref 0.7–4.0)
MCH: 25.8 pg — ABNORMAL LOW (ref 26.0–34.0)
MCHC: 31.7 g/dL (ref 30.0–36.0)
MCV: 81.4 fL (ref 80.0–100.0)
Monocytes Absolute: 0.5 10*3/uL (ref 0.1–1.0)
Monocytes Relative: 5 %
Neutro Abs: 6.8 10*3/uL (ref 1.7–7.7)
Neutrophils Relative %: 68 %
Platelets: 294 10*3/uL (ref 150–400)
RBC: 5.15 MIL/uL (ref 4.22–5.81)
RDW: 14.2 % (ref 11.5–15.5)
WBC: 10 10*3/uL (ref 4.0–10.5)
nRBC: 0 % (ref 0.0–0.2)

## 2022-07-16 LAB — BASIC METABOLIC PANEL
Anion gap: 8 (ref 5–15)
BUN: 9 mg/dL (ref 6–20)
CO2: 28 mmol/L (ref 22–32)
Calcium: 8.8 mg/dL — ABNORMAL LOW (ref 8.9–10.3)
Chloride: 102 mmol/L (ref 98–111)
Creatinine, Ser: 0.72 mg/dL (ref 0.61–1.24)
GFR, Estimated: >60 mL/min (ref >60)
Glucose, Bld: 342 mg/dL — ABNORMAL HIGH (ref 70–99)
Potassium: 3.3 mmol/L — ABNORMAL LOW (ref 3.5–5.1)
Sodium: 138 mmol/L (ref 135–145)

## 2022-07-16 LAB — TROPONIN I (HIGH SENSITIVITY)
Troponin I (High Sensitivity): 4 ng/L (ref <18)
Troponin I (High Sensitivity): 5 ng/L (ref <18)

## 2022-07-16 LAB — BRAIN NATRIURETIC PEPTIDE: B Natriuretic Peptide: 94.8 pg/mL (ref 0.0–100.0)

## 2022-07-16 MED ORDER — ASPIRIN 325 MG PO TABS
325.0000 mg | ORAL_TABLET | Freq: Every day | ORAL | Status: DC
  Administered 2022-07-16: 325 mg via ORAL
  Filled 2022-07-16: qty 1

## 2022-07-16 NOTE — Discharge Instructions (Signed)
You are seen today in the emergency department for chest pain.  Take Tylenol and Motrin for any pain.  I put in a referral for cardiology shows that you call and schedule an appointment with you, if you do not hear from them the next 2 days call schedule appointment yourself.  Return to ED for chest pain.  Changes in way or you have new symptoms.  Work towards smoking cessation.  Also continue checking your sugar at home as you are hyperglycemic today.

## 2022-07-16 NOTE — ED Provider Notes (Signed)
[facility] DEPT Provider Note   CSN: [id number] Arrival date & time: 07/16/22  1623     History  Chief Complaint  Patient presents with   Chest Pain    Shawn Benson is a 41 y.o. male.   Chest Pain    Patient with medical history of tobacco abuse, morbid obesity BMI 39, diabetes, asthma presents today due to chest pain.  It started yesterday, its constant but the severity waxes and wanes.  Severity is worse with certain positions like moving forward.  Denies any exertional or pleuritic component.  She also of his chest, it feels worse when you push on it.  Does not radiate elsewhere, no associated nausea, vomiting or shortness of breath.  States has been having bilateral lower extremity swelling over the last 2 months, he has been on Lasix.  Patient's older brother had a heart attack at 67.  No meds prior to arrival.  Home Medications Prior to Admission medications   Medication Sig Start Date End Date Taking? Authorizing Provider  atorvastatin (LIPITOR) 20 MG tablet Take 1 tablet (20 mg total) by mouth daily. 08/02/21   Mack Hook, MD  Blood Glucose Monitoring Suppl (ACCU-CHEK AVIVA PLUS) w/Device KIT Check blood sugar before meals 3 times daily 09/16/20   Mack Hook, MD  Continuous Blood Gluc Receiver (FREESTYLE LIBRE 2 READER) DEVI Check blood glucose 4 times daily before meals and at bedtime. 06/28/21   Mack Hook, MD  Continuous Blood Gluc Sensor (FREESTYLE LIBRE 2 SENSOR) MISC Check Blood glucose 4 times daily before each meal and at bedtime 06/28/21   Mack Hook, MD  gabapentin (NEURONTIN) 300 MG capsule Take 1 capsule (300 mg total) by mouth at bedtime. 12/14/21   Landis Martins, DPM  insulin NPH-regular Human (NOVOLIN 70/30) (70-30) 100 UNIT/ML injection Inject 52 Units into the skin 2 (two) times daily with a meal. 52 units subcutaneously twice daily before meals Patient taking differently: Inject 68 Units  into the skin 2 (two) times daily with a meal. 68 units subcutaneously twice daily before meals 07/10/16   Mack Hook, MD  Insulin Syringe-Needle U-100 (INSULIN SYRINGE .5CC/31GX5/16") 31G X 5/16" 0.5 ML MISC 1 Device by Does not apply route 2 (two) times daily with a meal. 10/20/16   Mack Hook, MD  ketoconazole (NIZORAL) 2 % cream Apply to both feet and between toes once daily for 6 weeks. 06/26/22   Marzetta Board, DPM  terbinafine (LAMISIL) 250 MG tablet 1 tab by mouth daily for 84 days. 06/28/21   Mack Hook, MD      Allergies    Patient has no known allergies.    Review of Systems   Review of Systems  Cardiovascular:  Positive for chest pain.    Physical Exam Updated Vital Signs BP 115/74   Pulse 69   Temp 98.3 F (36.8 C)   Resp 20   Ht '6\' 7"'  (2.007 m)   Wt (!) 158.8 kg   SpO2 99%   BMI 39.43 kg/m  Physical Exam Vitals and nursing note reviewed. Exam conducted with a chaperone present.  Constitutional:      Appearance: Normal appearance. He is obese.  HENT:     Head: Normocephalic and atraumatic.  Eyes:     General: No scleral icterus.       Right eye: No discharge.        Left eye: No discharge.     Extraocular Movements: Extraocular movements intact.  Pupils: Pupils are equal, round, and reactive to light.  Cardiovascular:     Rate and Rhythm: Normal rate and regular rhythm.     Pulses: Normal pulses.     Heart sounds: Normal heart sounds. No murmur heard.    No friction rub. No gallop.  Pulmonary:     Effort: Pulmonary effort is normal. No respiratory distress.     Breath sounds: Normal breath sounds.     Comments: Lungs are clear to auscultation bilaterally Chest:     Chest wall: Tenderness present.     Comments: Pinpoint reproducible tenderness to chest wall. Abdominal:     General: Abdomen is flat. Bowel sounds are normal. There is no distension.     Palpations: Abdomen is soft.     Tenderness: There is no abdominal  tenderness.  Musculoskeletal:     Right lower leg: Edema present.     Left lower leg: Edema present.     Comments: Trace edema to the lower extremities bilaterally  Skin:    General: Skin is warm.     Coloration: Skin is not jaundiced.  Neurological:     Mental Status: He is alert. Mental status is at baseline.     Coordination: Coordination normal.     ED Results / Procedures / Treatments   Labs (all labs ordered are listed, but only abnormal results are displayed) Labs Reviewed  CBC WITH DIFFERENTIAL/PLATELET - Abnormal; Notable for the following components:      Result Value   MCH 25.8 (*)    All other components within normal limits  BASIC METABOLIC PANEL - Abnormal; Notable for the following components:   Potassium 3.3 (*)    Glucose, Bld 342 (*)    Calcium 8.8 (*)    All other components within normal limits  BRAIN NATRIURETIC PEPTIDE  TROPONIN I (HIGH SENSITIVITY)  TROPONIN I (HIGH SENSITIVITY)    EKG EKG Interpretation  Date/Time:  Monday July 16 2022 16:32:01 EDT Ventricular Rate:  73 PR Interval:  168 QRS Duration: 86 QT Interval:  403 QTC Calculation: 445 R Axis:   55 Text Interpretation: Sinus rhythm ST elev, probable normal early repol pattern Confirmed by Dene Gentry [phone]) on 07/16/2022 6:05:11 PM  Radiology DG Chest 2 View  Result Date: 07/16/2022 CLINICAL DATA:  Left-sided chest pain since yesterday. EXAM: CHEST - 2 VIEW COMPARISON:  None Available. FINDINGS: The cardiomediastinal contours are normal. Mild peribronchial thickening. Pulmonary vasculature is normal. No consolidation, pleural effusion, or pneumothorax. No acute osseous abnormalities are seen. IMPRESSION: Mild peribronchial thickening suggesting asthma or bronchitis. Electronically Signed   By: Keith Rake M.D.   On: 07/16/2022 17:09    Procedures Procedures    Medications Ordered in ED Medications  aspirin tablet 325 mg (325 mg Oral Given 07/16/22 1707)    ED Course/  Medical Decision Making/ A&P                           Medical Decision Making Amount and/or Complexity of Data Reviewed Labs: ordered. Radiology: ordered.  Risk OTC drugs.   Patient presents for chest pain.  Differential includes but not limited to ACS, PE, pneumonia, pericarditis, esophagitis, dissection, AAA, GERD, pneumothorax, costochondritis.  On exam patient has very reproducible pinpoint tenderness to the chest wall.  Lungs are clear to auscultation, upper and lower extremity pulses 2+ symmetric bilaterally.  Nonseptic appearing.  I ordered, viewed and interpreted laboratory work-up. -CBC without leukocytosis or anemia. -BMP  shows mild hypokalemia 3.3.  Patient's hyperglycemic at 342 but not in DKA. BNP is unremarkable, not consistent with a new onset of CHF. Initial troponin 4--->5, flat delta  I ordered, viewed and interpreted plain film of chest.  Notable for reactive airway disease likely asthma versus bronchitis.  Patient has underlying asthma ready, discussed x-ray and advised against smoking cessation.  EKG shows some repolarization pattern.  No specific ischemic changes or reciprocal changes.    I reevaluated the patient is feeling improved.  Presentation is not consistent with dissection.  Given negative delta troponin and no specific ischemic findings on EKG I do not think this is ACS.  No pneumonia, pericarditis.  possible costochondritis?  given patient's risk factors will do ambulatory referral for cardiology.  strict return precautions were discussed with the patient.  we discussed also smoking cessation as well as better management of his diabetes.         Final Clinical Impression(s) / ED Diagnoses Final diagnoses:  None    Rx / DC Orders ED Discharge Orders     None         Sherrill Raring, Hershal Coria 07/16/22 2326    Valarie Merino, MD 07/17/22 1451

## 2022-07-16 NOTE — ED Triage Notes (Signed)
Patient c/o left chest pain since yesterday. Patient states he has increased pressure with activity. Patient denies SOB, N/V, or diaphoresis.

## 2022-07-18 ENCOUNTER — Ambulatory Visit: Payer: BC Managed Care – PPO | Attending: Cardiovascular Disease | Admitting: Cardiovascular Disease

## 2022-07-18 ENCOUNTER — Encounter: Payer: Self-pay | Admitting: *Deleted

## 2022-07-18 ENCOUNTER — Encounter: Payer: Self-pay | Admitting: Cardiovascular Disease

## 2022-07-18 VITALS — BP 108/72 | HR 64 | Ht 79.0 in | Wt 366.0 lb

## 2022-07-18 DIAGNOSIS — R072 Precordial pain: Secondary | ICD-10-CM | POA: Diagnosis not present

## 2022-07-18 DIAGNOSIS — E119 Type 2 diabetes mellitus without complications: Secondary | ICD-10-CM | POA: Diagnosis not present

## 2022-07-18 DIAGNOSIS — F17200 Nicotine dependence, unspecified, uncomplicated: Secondary | ICD-10-CM | POA: Diagnosis not present

## 2022-07-18 DIAGNOSIS — E785 Hyperlipidemia, unspecified: Secondary | ICD-10-CM | POA: Diagnosis not present

## 2022-07-18 DIAGNOSIS — Z794 Long term (current) use of insulin: Secondary | ICD-10-CM

## 2022-07-18 DIAGNOSIS — IMO0001 Reserved for inherently not codable concepts without codable children: Secondary | ICD-10-CM

## 2022-07-18 NOTE — Patient Instructions (Signed)
Medication Instructions:  No changes *If you need a refill on your cardiac medications before your next appointment, please call your pharmacy*   Lab Work: None ordered If you have labs (blood work) drawn today and your tests are completely normal, you will receive your results only by: [place] (if you have MyChart) OR A paper copy in the mail If you have any lab test that is abnormal or we need to change your treatment, we will call you to review the results.   Testing/Procedures: Dr. Sallyanne Kuster has ordered a CT coronary calcium score.   Test locations:  [place]   This is $99 out of pocket.   Coronary CalciumScan A coronary calcium scan is an imaging test used to look for deposits of calcium and other fatty materials (plaques) in the inner lining of the blood vessels of the heart (coronary arteries). These deposits of calcium and plaques can partly clog and narrow the coronary arteries without producing any symptoms or warning signs. This puts a person at risk for a heart attack. This test can detect these deposits before symptoms develop. Tell a health care provider about: Any allergies you have. All medicines you are taking, including vitamins, herbs, eye drops, creams, and over-the-counter medicines. Any problems you or family members have had with anesthetic medicines. Any blood disorders you have. Any surgeries you have had. Any medical conditions you have. Whether you are pregnant or may be pregnant. What are the risks? Generally, this is a safe procedure. However, problems may occur, including: Harm to a pregnant woman and her unborn baby. This test involves the use of radiation. Radiation exposure can be dangerous to a pregnant woman and her unborn baby. If you are pregnant, you generally should not have this procedure done. Slight increase in the risk of cancer. This is because of the radiation involved in the test. What happens  before the procedure? No preparation is needed for this procedure. What happens during the procedure? You will undress and remove any jewelry around your neck or chest. You will put on a hospital gown. Sticky electrodes will be placed on your chest. The electrodes will be connected to an electrocardiogram (ECG) machine to record a tracing of the electrical activity of your heart. A CT scanner will take pictures of your heart. During this time, you will be asked to lie still and hold your breath for 2-3 seconds while a picture of your heart is being taken. The procedure may vary among health care providers and hospitals. What happens after the procedure? You can get dressed. You can return to your normal activities. It is up to you to get the results of your test. Ask your health care provider, or the department that is doing the test, when your results will be ready. Summary A coronary calcium scan is an imaging test used to look for deposits of calcium and other fatty materials (plaques) in the inner lining of the blood vessels of the heart (coronary arteries). Generally, this is a safe procedure. Tell your health care provider if you are pregnant or may be pregnant. No preparation is needed for this procedure. A CT scanner will take pictures of your heart. You can return to your normal activities after the scan is done. This information is not intended to replace advice given to you by your health care provider. Make sure you discuss any questions you have with your health care provider. Document Released: 03/22/2008 Document Revised: 08/13/2016 Document Reviewed:  08/13/2016 Elsevier Interactive Patient Education  2017 [place]: At [facility], you and your health needs are our priority.  As part of our continuing mission to provide you with exceptional heart care, we have created designated Provider Care Teams.  These Care Teams include your primary  Cardiologist (physician) and Advanced Practice Providers (APPs -  Physician Assistants and Nurse Practitioners) who all work together to provide you with the care you need, when you need it.  We recommend signing up for the patient portal called "MyChart".  Sign up information is provided on this After Visit Summary.  MyChart is used to connect with patients for Virtual Visits (Telemedicine).  Patients are able to view lab/test results, encounter notes, upcoming appointments, etc.  Non-urgent messages can be sent to your provider as well.   To learn more about what you can do with MyChart, go to [email or web address].    Your next appointment:   12 month(s)  The format for your next appointment:   In Person  Provider:   Sanda Klein, MD     Other Instructions A referral has been placed to Endocrinology   Important Information About Sugar

## 2022-07-18 NOTE — Progress Notes (Signed)
Cardiology Office Note:    Date:  07/18/2022   ID:  Shawn Benson, DOB 1981-09-20, MRN [id number]  PCP:  Julieanne Manson, MD   [facility] Providers Cardiologist:  Thurmon Fair, MD     Referring MD: Theron Arista, PA-C   Chief Complaint  Patient presents with   Chest Pain  Shawn Benson is a 41 y.o. male who is being seen today for the evaluation of chest pain at the request of Theron Arista, [place].   History of Present Illness:    Shawn Benson is a 41 y.o. male with a hx of type 2 diabetes mellitus on insulin, dyslipidemia, morbid obesity, active smoker, chronic lower extremity edema who is seen for an episode of chest pain that led to emergency room evaluation on July 16, 2022.  The ER provider thought that the chest pain was positional and reproducible with pressure ("pinpoint tenderness").  His ECG showed normal sinus rhythm with diffuse ST segment elevation felt to be secondary to early repolarization pattern.  Cardiac enzymes were normal (high-sensitivity troponin 4-5).  He was markedly hyperglycemic at 342 and his potassium was low at 3.3 but other labs were normal.  In the office today, he still has left sided chest discomfort just underneath his left breast.  It is not pleuritic in nature and it is not sharp.  Feels like he has "a golf ball" there.  Clearly tender to deep palpation and clearly positional.  He does not member injuring himself or falling on it.  He works out daily at [employer]  He is a former [employer].  He does not have any chest discomfort during physical exercise.  He denies shortness of breath at rest with activity.  He has not had palpitations, dizziness, syncope, lower extremity edema or focal neurological complaints.  He has very poorly controlled diabetes.  The most recent hemoglobin A1c available for review is 4.9% roughly a year ago.  In December 2022, he had a lipid profile showing total cholesterol 160, HDL 32, calculated  LDL 101, borderline triglycerides 150.  He takes atorvastatin 20 mg daily.  He has normal renal function.  He does not have hypertension.   He reports that his older brother had a myocardial infarction at age 67.  His father had multivessel bypass surgery in his early 13s.  Shawn smokes roughly half a pack of cigarettes a day.  He has successfully quit smoking in the past.  He smokes marijuana as well.  Past Medical History:  Diagnosis Date   Allergy    Seasonal--spring through fall.   Asthma childhood and adult   triggers are allergies and URI.  Spring, summer and fall.   Diabetes mellitus without complication (HCC)    Morbid obesity (HCC)    Peripheral edema 04/09/2017    Past Surgical History:  Procedure Laterality Date   INCISION AND DRAINAGE PERIRECTAL ABSCESS N/A 10/06/2015   Procedure: IRRIGATION AND DEBRIDEMENT PERIANAL  ABSCESS;  Surgeon: Ovidio Kin, MD;  Location: [facility];  Service: General;  Laterality: N/A;   KNEE SURGERY     Right and Left Knee    Current Medications: Current Meds  Medication Sig   insulin NPH-regular Human (NOVOLIN 70/30) (70-30) 100 UNIT/ML injection Inject 52 Units into the skin 2 (two) times daily with a meal. 52 units subcutaneously twice daily before meals     Allergies:   Patient has no known allergies.   Social History   Socioeconomic History   Marital  status: Single    Spouse name: Not on file   Number of children: 3   Years of education: Not on file   Highest education level: Not on file  Occupational History   Occupation: Dealer work--side jobs  Tobacco Use   Smoking status: Every Day    Packs/day: 1.00    Types: Cigarettes    Last attempt to quit: 09/05/2015    Years since quitting: 6.8   Smokeless tobacco: Never   Tobacco comments:    07/18/2022 patient states he smokes 1/2 pack daily  Vaping Use   Vaping Use: Never used  Substance and Sexual Activity   Alcohol use: Yes    Alcohol/week: 0.0 standard drinks of  alcohol    Comment: occasional beer   Drug use: Yes    Types: Marijuana    Comment: occasonally   Sexual activity: Yes    Partners: Female    Birth control/protection: None    Comment: Already a grandfather.  States his youngest child's mother would like to get pregnant  Other Topics Concern   Not on file  Social History Narrative   Gambling "fish tables"  This is when he smokes.   Chain smokes when he does this.   Not interested in obtaining help for this.   Lives with his mother.   Has been in prison twice--he thinks the last was drug related.   Social Determinants of Health   Financial Resource Strain: Not on file  Food Insecurity: Not on file  Transportation Needs: Not on file  Physical Activity: Not on file  Stress: Not on file  Social Connections: Not on file     Family History: The patient's family history includes Diabetes in his brother, brother, father, and mother.  ROS:   Please see the history of present illness.     All other systems reviewed and are negative.  EKGs/Labs/Other Studies Reviewed:    The following studies were reviewed today: Notes, imaging studies, labs from recent ER visit  EKG:  EKG is  ordered today.  The ekg ordered today demonstrates normal sinus rhythm, diffuse mild ST segment elevation/J-point elevation consistent with early repolarization.  Recent Labs: 10/06/2021: ALT 12 07/16/2022: B Natriuretic Peptide 94.8; BUN 9; Creatinine, Ser 0.72; Hemoglobin 13.3; Platelets 294; Potassium 3.3; Sodium 138  Recent Lipid Panel    Component Value Date/Time   CHOL 160 10/06/2021 0956   TRIG 150 (H) 10/06/2021 0956   HDL 32 (L) 10/06/2021 0956   CHOLHDL 3.9 09/11/2016 1000   LDLCALC 101 (H) 10/06/2021 0956     Risk Assessment/Calculations:                Physical Exam:    VS:  BP 108/72 (BP Location: Left Arm, Patient Position: Sitting, Cuff Size: Large)   Pulse 64   Ht 6\' 7"  (2.007 m)   Wt (!) 366 lb (166 kg)   SpO2 96%   BMI  41.23 kg/m     Wt Readings from Last 3 Encounters:  07/18/22 (!) 366 lb (166 kg)  07/16/22 (!) 350 lb (158.8 kg)  06/28/21 (!) 367 lb 8 oz (166.7 kg)     GEN: Morbidly obese, well nourished, well developed in no acute distress HEENT: Normal NECK: No JVD; No carotid bruits LYMPHATICS: No lymphadenopathy CARDIAC: RRR, no murmurs, rubs, gallops RESPIRATORY:  Clear to auscultation without rales, wheezing or rhonchi  ABDOMEN: Soft, non-tender, non-distended MUSCULOSKELETAL:  No edema; No deformity  SKIN: Warm and dry NEUROLOGIC:  Alert and oriented x 3 PSYCHIATRIC:  Normal affect   ASSESSMENT:    1. Precordial pain   2. Dyslipidemia   3. Type 2 diabetes mellitus without complication, with long-term current use of insulin (HCC)   4. Smoking    PLAN:    In order of problems listed above:  Precordial pain: I think this is clearly musculoskeletal.  He has reproducible pain with palpation.  The pain is positional.  It does not sound like acute pericarditis since it is not pleuritic or sharp.  The EKG changes are more consistent with early repolarization (J-point elevation) rather than acute pericarditis. Dyslipidemia: He has a markedly low HDL cholesterol despite having a decent overall cholesterol and LDL levels.  He has a strong family history of early onset coronary artery disease including a brother who had a myocardial infarction before the age of 67.  I recommended a coronary calcium score, even though Shawn is not yet 41 years old.  If his calcium score is anything higher than 0, I would recommend intensifying lipid-lowering therapy to achieve a target LDL less than 70.  If his coronary calcium score is 0, would continue the current dose of atorvastatin with a target LDL less than 100. DM2: Poorly controlled.  He is only on insulin.  He will clearly need to continue insulin to avoid severe hyperglycemia and its complications, but in the long run this will not improve and may actually  worsen his cardiovascular outcomes.  Would prefer to have him on SGLT2 inhibitors and GLP-1 agonist.  He did take metformin in the past but stopped it since it was "not doing anything".  I think he needs to the expert care of an endocrinologist and we will make the referral to [facility]ocr[facility]. Cigarette smoking: Encouraged him to quit permanently.  He says he has successfully done it before, but restarted smoking during a stressful period in his life.  He will try to quit again.           Medication Adjustments/Labs and Tests Ordered: Current medicines are reviewed at length with the patient today.  Concerns regarding medicines are outlined above.  Orders Placed This Encounter  Procedures   CT CARDIAC SCORING (SELF PAY ONLY)   Ambulatory referral to Endocrinology   EKG 12-Lead   No orders of the defined types were placed in this encounter.   Patient Instructions  Medication Instructions:  No changes *If you need a refill on your cardiac medications before your next appointment, please call your pharmacy*   Lab Work: None ordered If you have labs (blood work) drawn today and your tests are completely normal, you will receive your results only by: MyChart Message (if you have MyChart) OR A paper copy in the mail If you have any lab test that is abnormal or we need to change your treatment, we will call you to review the results.   Testing/Procedures: Dr. Royann Shivers has ordered a CT coronary calcium score.   Test locations:  [facility]   This is $99 out of pocket.   Coronary CalciumScan A coronary calcium scan is an imaging test used to look for deposits of calcium and other fatty materials (plaques) in the inner lining of the blood vessels of the heart (coronary arteries). These deposits of calcium and plaques can partly clog and narrow the coronary arteries without producing any symptoms or warning signs. This puts a person at risk for a  heart attack. This test can detect  these deposits before symptoms develop. Tell a health care provider about: Any allergies you have. All medicines you are taking, including vitamins, herbs, eye drops, creams, and over-the-counter medicines. Any problems you or family members have had with anesthetic medicines. Any blood disorders you have. Any surgeries you have had. Any medical conditions you have. Whether you are pregnant or may be pregnant. What are the risks? Generally, this is a safe procedure. However, problems may occur, including: Harm to a pregnant woman and her unborn baby. This test involves the use of radiation. Radiation exposure can be dangerous to a pregnant woman and her unborn baby. If you are pregnant, you generally should not have this procedure done. Slight increase in the risk of cancer. This is because of the radiation involved in the test. What happens before the procedure? No preparation is needed for this procedure. What happens during the procedure? You will undress and remove any jewelry around your neck or chest. You will put on a hospital gown. Sticky electrodes will be placed on your chest. The electrodes will be connected to an electrocardiogram (ECG) machine to record a tracing of the electrical activity of your heart. A CT scanner will take pictures of your heart. During this time, you will be asked to lie still and hold your breath for 2-3 seconds while a picture of your heart is being taken. The procedure may vary among health care providers and hospitals. What happens after the procedure? You can get dressed. You can return to your normal activities. It is up to you to get the results of your test. Ask your health care provider, or the department that is doing the test, when your results will be ready. Summary A coronary calcium scan is an imaging test used to look for deposits of calcium and other fatty materials (plaques) in the inner lining of the  blood vessels of the heart (coronary arteries). Generally, this is a safe procedure. Tell your health care provider if you are pregnant or may be pregnant. No preparation is needed for this procedure. A CT scanner will take pictures of your heart. You can return to your normal activities after the scan is done. This information is not intended to replace advice given to you by your health care provider. Make sure you discuss any questions you have with your health care provider. Document Released: 03/22/2008 Document Revised: 08/13/2016 Document Reviewed: 08/13/2016 Elsevier Interactive Patient Education  2017 [employer]    Follow-Up: At [facility], you and your health needs are our priority.  As part of our continuing mission to provide you with exceptional heart care, we have created designated Provider Care Teams.  These Care Teams include your primary Cardiologist (physician) and Advanced Practice Providers (APPs -  Physician Assistants and Nurse Practitioners) who all work together to provide you with the care you need, when you need it.  We recommend signing up for the patient portal called "MyChart".  Sign up information is provided on this After Visit Summary.  MyChart is used to connect with patients for Virtual Visits (Telemedicine).  Patients are able to view lab/test results, encounter notes, upcoming appointments, etc.  Non-urgent messages can be sent to your provider as well.   To learn more about what you can do with MyChart, go to [email or web address].    Your next appointment:   12 month(s)  The format for your next appointment:   In Person  Provider:   Thurmon FairMihai Bynum Mccullars, MD  Other Instructions A referral has been placed to Endocrinology   Important Information About Sugar         Signed, Thurmon Fair, MD  07/18/2022 9:46 AM    [facility]

## 2022-08-28 ENCOUNTER — Ambulatory Visit (HOSPITAL_BASED_OUTPATIENT_CLINIC_OR_DEPARTMENT_OTHER): Payer: BC Managed Care – PPO

## 2022-09-21 ENCOUNTER — Ambulatory Visit (HOSPITAL_COMMUNITY)
Admission: RE | Admit: 2022-09-21 | Discharge: 2022-09-21 | Disposition: A | Discharge status: Home / Self Care | Payer: BC Managed Care – PPO | Source: Ambulatory Visit | Attending: Cardiovascular Disease | Admitting: Cardiovascular Disease

## 2022-09-21 DIAGNOSIS — E785 Hyperlipidemia, unspecified: Secondary | ICD-10-CM | POA: Insufficient documentation

## 2022-10-15 ENCOUNTER — Ambulatory Visit (INDEPENDENT_AMBULATORY_CARE_PROVIDER_SITE_OTHER): Payer: BC Managed Care – PPO | Admitting: Podiatry

## 2022-10-15 ENCOUNTER — Ambulatory Visit: Payer: BC Managed Care – PPO | Admitting: Podiatry

## 2022-10-15 ENCOUNTER — Ambulatory Visit (INDEPENDENT_AMBULATORY_CARE_PROVIDER_SITE_OTHER): Payer: BC Managed Care – PPO

## 2022-10-15 ENCOUNTER — Encounter: Payer: Self-pay | Admitting: Podiatry

## 2022-10-15 VITALS — BP 122/77

## 2022-10-15 DIAGNOSIS — M79675 Pain in left toe(s): Secondary | ICD-10-CM

## 2022-10-15 DIAGNOSIS — E119 Type 2 diabetes mellitus without complications: Secondary | ICD-10-CM

## 2022-10-15 DIAGNOSIS — S91332A Puncture wound without foreign body, left foot, initial encounter: Secondary | ICD-10-CM

## 2022-10-15 DIAGNOSIS — M2141 Flat foot [pes planus] (acquired), right foot: Secondary | ICD-10-CM

## 2022-10-15 DIAGNOSIS — B351 Tinea unguium: Secondary | ICD-10-CM

## 2022-10-15 DIAGNOSIS — E1142 Type 2 diabetes mellitus with diabetic polyneuropathy: Secondary | ICD-10-CM | POA: Diagnosis not present

## 2022-10-15 DIAGNOSIS — M79674 Pain in right toe(s): Secondary | ICD-10-CM | POA: Diagnosis not present

## 2022-10-15 MED ORDER — AMOXICILLIN-POT CLAVULANATE 875-125 MG PO TABS: 1.0000 | ORAL_TABLET | Freq: Two times a day (BID) | ORAL | 0 refills | Status: DC

## 2022-10-15 NOTE — Progress Notes (Signed)
ANNUAL DIABETIC FOOT EXAM  Subjective: Shawn Benson presents today for annual diabetic foot examination.  Chief Complaint  Patient presents with   Nail Problem    [facility] BS-do not check A1C-do not know PCP-Elizabeth Mulberry PCP VST-2 years ago Patient stats that he recently stepped on a nail. And he is concern about his right foot.   Patient confirms h/o diabetes.  Patient relates 5 year h/o diabetes.  Patient relates on the job injury which occurred on October 10, 2021. States he stepped on a nail and it penetrated the skin of his left foot. He relates pain on the top of his left foot where he has some redness. Patient states last tetanus vaccine was 4 years ago. States he has been adjusting his own medications and has not seen PCP since September, 2022.      Patient has been diagnosed with neuropathy.  Risk factors: diabetes, dyslipidemia, current tobacco user.  Julieanne Manson, MD is patient's PCP.   Past Medical History:  Diagnosis Date   Allergy    Seasonal--spring through fall.   Asthma childhood and adult   triggers are allergies and URI.  Spring, summer and fall.   Diabetes mellitus without complication (HCC)    Morbid obesity (HCC)    Peripheral edema 04/09/2017   Patient Active Problem List   Diagnosis Date Noted   Abscessed tooth 07/08/2020   Closed fracture of metacarpal bone 01/20/2020   Pain in right hand 01/20/2020   Peripheral edema 04/09/2017   Dyslipidemia 09/14/2016   Sleep apnea 07/10/2016   Tinea corporis 07/10/2016   Tinea pedis 07/10/2016   Abscess of axilla, left 05/11/2016   Onychomycosis of toenail 12/06/2015   Type 2 diabetes mellitus without complication, with long-term current use of insulin (HCC) 10/07/2015   Asthma 10/06/2015   Morbid obesity due to excess calories [facility])    Past Surgical History:  Procedure Laterality Date   INCISION AND DRAINAGE PERIRECTAL ABSCESS N/A 10/06/2015   Procedure: IRRIGATION AND DEBRIDEMENT  PERIANAL  ABSCESS;  Surgeon: Ovidio Kin, MD;  Location: [facility];  Service: General;  Laterality: N/A;   KNEE SURGERY     Right and Left Knee   Current Outpatient Medications on File Prior to Visit  Medication Sig Dispense Refill   atorvastatin (LIPITOR) 20 MG tablet Take 1 tablet (20 mg total) by mouth daily. (Patient not taking: Reported on 07/18/2022) 30 tablet 11   Blood Glucose Monitoring Suppl (ACCU-CHEK AVIVA PLUS) w/Device KIT Check blood sugar before meals 3 times daily (Patient not taking: Reported on 07/18/2022) 1 kit 0   Continuous Blood Gluc Receiver (FREESTYLE LIBRE 2 READER) DEVI Check blood glucose 4 times daily before meals and at bedtime. (Patient not taking: Reported on 07/18/2022) 1 each 11   Continuous Blood Gluc Sensor (FREESTYLE LIBRE 2 SENSOR) MISC Check Blood glucose 4 times daily before each meal and at bedtime (Patient not taking: Reported on 07/18/2022) 1 each 11   gabapentin (NEURONTIN) 300 MG capsule Take 1 capsule (300 mg total) by mouth at bedtime. (Patient not taking: Reported on 07/18/2022) 90 capsule 3   insulin NPH-regular Human (NOVOLIN 70/30) (70-30) 100 UNIT/ML injection Inject 52 Units into the skin 2 (two) times daily with a meal. 52 units subcutaneously twice daily before meals 30 mL 11   Insulin Syringe-Needle U-100 (INSULIN SYRINGE .5CC/31GX5/16") 31G X 5/16" 0.5 ML MISC 1 Device by Does not apply route 2 (two) times daily with a meal. (Patient not taking: Reported on 07/18/2022) 100  each 11   ketoconazole (NIZORAL) 2 % cream Apply to both feet and between toes once daily for 6 weeks. (Patient not taking: Reported on 07/18/2022) 60 g 1   terbinafine (LAMISIL) 250 MG tablet 1 tab by mouth daily for 84 days. (Patient not taking: Reported on 07/18/2022) 84 tablet 0   No current facility-administered medications on file prior to visit.    No Known Allergies Social History   Occupational History   Occupation: Lobbyist work--side jobs  Tobacco Use    Smoking status: Every Day    Packs/day: 1.00    Types: Cigarettes    Last attempt to quit: 09/05/2015    Years since quitting: 7.1   Smokeless tobacco: Never   Tobacco comments:    07/18/2022 patient states he smokes 1/2 pack daily  Vaping Use   Vaping Use: Never used  Substance and Sexual Activity   Alcohol use: Yes    Alcohol/week: 0.0 standard drinks of alcohol    Comment: occasional beer   Drug use: Yes    Types: Marijuana    Comment: occasonally   Sexual activity: Yes    Partners: Female    Birth control/protection: None    Comment: Already a grandfather.  States his youngest child's mother would like to get pregnant   Family History  Problem Relation Age of Onset   Diabetes Mother    Diabetes Father    Diabetes Brother    Diabetes Brother    Immunization History  Administered Date(s) Administered   Influenza Inj Mdck Quad Pf 09/14/2016   Influenza,inj,Quad PF,6+ Mos 12/06/2015   PFIZER(Purple Top)SARS-COV-2 Vaccination 12/22/2019, 01/12/2020   Pneumococcal Polysaccharide-23 03/30/2016   Tdap 07/11/2012, 03/15/2019     Review of Systems: Negative except as noted in the HPI.   Objective: Vitals:   10/15/22 0958  BP: 122/77   Shawn Benson is a pleasant 42 y.o. male in NAD. AAO X 3.  Vascular Examination: CFT <3 seconds b/l. DP/PT pulses faintly palpable b/l. Skin temperature gradient warm to warm b/l. No pain with calf compression. No ischemia or gangrene. No cyanosis or clubbing noted b/l. Trace edema noted BLE.   Neurological Examination: Sensation grossly intact b/l with 10 gram monofilament. Vibratory sensation intact b/l. Pt has subjective symptoms of neuropathy.  Dermatological Examination: Pedal skin warm and supple b/l. Toenails 1-5 b/l thick, discolored, elongated with subungual debris and pain on dorsal palpation.    Puncture spot noted plantar aspect left foot submet head 3 with mild erythema noted dorsal at 2nd-4th MPJs. There is tenderness  to palpation dorsal 3rd MPJ. No fluctuance, no warmth to indicate frank abscess.  Musculoskeletal Examination: Muscle strength 5/5 to b/l LE. Pes planus deformity noted bilateral LE.  Radiographs: None  Xrays left foot: No gas in tissues left foot. No soft tissue swelling noted at portal of entry of nail nor dorsally. No evidence of fracture left foot. No foreign body evident left foot.  Footwear Assessment: Does the patient wear appropriate shoes? Yes. Does the patient need inserts/orthotics? Yes.  ADA Risk Categorization: Low Risk :  Patient has all of the following: Intact protective sensation No prior foot ulcer  No severe deformity Pedal pulses present  Assessment: 1. Pain due to onychomycosis of toenails of both feet   2. Puncture wound of left foot, initial encounter   3. Acquired pes planovalgus of right foot   4. Diabetic polyneuropathy associated with type 2 diabetes mellitus (HCC)   5. Encounter for diabetic foot exam (  [place])     Plan: Orders Placed This Encounter  Procedures   DG Foot Complete Left    Order Specific Question:   Reason for Exam (SYMPTOM  OR DIAGNOSIS REQUIRED)    Answer:   Puncture wound left foot    Order Specific Question:   Preferred imaging location?    Answer:   External    Order Specific Question:   Radiology Contrast Protocol - do NOT remove file path    Answer:   [email or web address]   Meds ordered this encounter  Medications   amoxicillin-clavulanate (AUGMENTIN) 875-125 MG tablet    Sig: Take 1 tablet by mouth 2 (two) times daily.    Dispense:  20 tablet    Refill:  0   DG FOOT COMPLETE LEFT  -Patient was evaluated and treated. All patient's and/or POA's questions/concerns answered on today's visit. -Examined patient. -Patient with reported on the job injury on October 10, 2021, where he stepped on a nail. Advised patient to get HR from his job to contact office for correct  entity to be billed. He related understanding. -Diabetic foot examination performed today. -Patient was instructed to schedule appointment for tetanus update with PCP. -Continue foot and shoe inspections daily. Monitor blood glucose per PCP/Endocrinologist's recommendations. -Toenails 1-5 b/l were debrided in length and girth with sterile nail nippers and dremel without iatrogenic bleeding.  -Xray of left foot was performed and reviewed with patient and/or POA. -Rx for Augmentin 875/125 mg, #20, to be taken one twice daily for 10 days. He was educated on signs/symptoms of infection and instructed to report to ED with swelling, redness, drainage of the left foot with/without fever, chills, night sweats, nausea/vomiting. -May need MRI in the future and that would need to be approved by his Workers Comp. -Patient scheduled to see Dr. Daylene Katayama in 10 days for follow up of puncture wound left foot. -Patient/POA to call should there be question/concern in the interim.  Return in about 10 days (around 10/25/2022).  Marzetta Board, DPM

## 2022-10-15 NOTE — Patient Instructions (Signed)
Schedule an appointment with your PCP for tetanus update.

## 2022-10-17 ENCOUNTER — Encounter (HOSPITAL_COMMUNITY): Payer: Self-pay

## 2022-10-17 ENCOUNTER — Emergency Department (HOSPITAL_COMMUNITY)

## 2022-10-17 ENCOUNTER — Emergency Department (HOSPITAL_COMMUNITY)
Admission: EM | Admit: 2022-10-17 | Discharge: 2022-10-17 | Disposition: A | Discharge status: Home / Self Care | Attending: Emergency Medicine | Admitting: Emergency Medicine

## 2022-10-17 ENCOUNTER — Emergency Department (HOSPITAL_COMMUNITY): Payer: BC Managed Care – PPO

## 2022-10-17 DIAGNOSIS — L089 Local infection of the skin and subcutaneous tissue, unspecified: Secondary | ICD-10-CM

## 2022-10-17 DIAGNOSIS — Z4800 Encounter for change or removal of nonsurgical wound dressing: Secondary | ICD-10-CM | POA: Diagnosis not present

## 2022-10-17 DIAGNOSIS — E119 Type 2 diabetes mellitus without complications: Secondary | ICD-10-CM | POA: Diagnosis not present

## 2022-10-17 DIAGNOSIS — M7989 Other specified soft tissue disorders: Secondary | ICD-10-CM | POA: Diagnosis present

## 2022-10-17 DIAGNOSIS — Z794 Long term (current) use of insulin: Secondary | ICD-10-CM | POA: Insufficient documentation

## 2022-10-17 DIAGNOSIS — Z4801 Encounter for change or removal of surgical wound dressing: Secondary | ICD-10-CM | POA: Diagnosis not present

## 2022-10-17 DIAGNOSIS — Z5189 Encounter for other specified aftercare: Secondary | ICD-10-CM

## 2022-10-17 DIAGNOSIS — R600 Localized edema: Secondary | ICD-10-CM | POA: Insufficient documentation

## 2022-10-17 DIAGNOSIS — L03116 Cellulitis of left lower limb: Secondary | ICD-10-CM | POA: Insufficient documentation

## 2022-10-17 DIAGNOSIS — L0390 Cellulitis, unspecified: Secondary | ICD-10-CM

## 2022-10-17 DIAGNOSIS — S91332A Puncture wound without foreign body, left foot, initial encounter: Secondary | ICD-10-CM

## 2022-10-17 LAB — CBC
HCT: 46.5 % (ref 39.0–52.0)
Hemoglobin: 14.9 g/dL (ref 13.0–17.0)
MCH: 26.1 pg (ref 26.0–34.0)
MCHC: 32 g/dL (ref 30.0–36.0)
MCV: 81.4 fL (ref 80.0–100.0)
Platelets: 271 10*3/uL (ref 150–400)
RBC: 5.71 MIL/uL (ref 4.22–5.81)
RDW: 14.2 % (ref 11.5–15.5)
WBC: 9.2 10*3/uL (ref 4.0–10.5)
nRBC: 0 % (ref 0.0–0.2)

## 2022-10-17 LAB — BASIC METABOLIC PANEL
Anion gap: 8 (ref 5–15)
BUN: 9 mg/dL (ref 6–20)
CO2: 25 mmol/L (ref 22–32)
Calcium: 8.9 mg/dL (ref 8.9–10.3)
Chloride: 103 mmol/L (ref 98–111)
Creatinine, Ser: 0.37 mg/dL — ABNORMAL LOW (ref 0.61–1.24)
GFR, Estimated: >60 mL/min (ref >60)
Glucose, Bld: 192 mg/dL — ABNORMAL HIGH (ref 70–99)
Potassium: 3.4 mmol/L — ABNORMAL LOW (ref 3.5–5.1)
Sodium: 136 mmol/L (ref 135–145)

## 2022-10-17 LAB — CBG MONITORING, ED: Glucose-Capillary: 190 mg/dL — ABNORMAL HIGH (ref 70–99)

## 2022-10-17 MED ORDER — AMOXICILLIN-POT CLAVULANATE 875-125 MG PO TABS: 1.0000 | ORAL_TABLET | Freq: Two times a day (BID) | ORAL | 0 refills | Status: DC

## 2022-10-17 MED ORDER — SULFAMETHOXAZOLE-TRIMETHOPRIM 800-160 MG PO TABS: 2.0000 | ORAL_TABLET | Freq: Two times a day (BID) | ORAL | 0 refills | Status: AC

## 2022-10-17 MED ORDER — IOHEXOL 300 MG/ML  SOLN
100.0000 mL | Freq: Once | INTRAMUSCULAR | Status: AC | PRN
  Administered 2022-10-17: 100 mL via INTRAVENOUS

## 2022-10-17 MED ORDER — AMOXICILLIN-POT CLAVULANATE ER 1000-62.5 MG PO TB12: 2.0000 | ORAL_TABLET | Freq: Two times a day (BID) | ORAL | 0 refills | Status: DC

## 2022-10-17 NOTE — ED Notes (Signed)
Pt back from CT

## 2022-10-17 NOTE — ED Triage Notes (Signed)
Pt arrived via POV, c/o wound on left foot after stepping on nail. Hx of diabetes.

## 2022-10-17 NOTE — Discharge Instructions (Addendum)
Please follow-up with podiatry stop taking your Augmentin, and restart Augmentin 2000 mg twice a day, and then also use Bactrim twice a day for 10 days.  Please follow-up with your primary care doctor and your podiatrist.  If you develop worsening swelling, redness please return to the ER.

## 2022-10-17 NOTE — ED Notes (Signed)
Assumed care of pt, he is alert and oriented at this time.  Denies pain as he "is not walking on it now."  Pt states he stepped on a nail about a week ago and it has not healed and is painful to walk in.  Denies fever, n/v/d.

## 2022-10-17 NOTE — ED Provider Triage Note (Signed)
Emergency Medicine Provider Triage Evaluation Note  Shawn Benson , a 42 y.o. male  was evaluated in triage.  Pt complains of L foot pain.  Patient stepped on a nail last week near the second left toe.  He reports that now he cannot feel his left second toe and cannot move the other small toes.  Pain has now spread over the plantar aspect of his L foot.  History of diabetes.  No fever.  He did not receive a Tdap as most recent dose was 4 years ago.  Review of Systems  Positive: As above Negative: As above  Physical Exam  BP 138/75 (BP Location: Left Arm)   Pulse (!) 59   Temp 98.1 F (36.7 C) (Oral)   Resp 17   Ht 6\' 7"  (2.007 m)   Wt (!) 154.2 kg   SpO2 99%   BMI 38.30 kg/m  Gen:   Awake, no distress   Resp:  Normal effort  MSK:   Moves extremities without difficulty  Other:  Puncture wound at the base of the second left toe.  Medical Decision Making  Medically screening exam initiated at 4:03 PM.  Appropriate orders placed.  Shawn Benson was informed that the remainder of the evaluation will be completed by another provider, this initial triage assessment does not replace that evaluation, and the importance of remaining in the ED until their evaluation is complete.     Rex Kras, [place] 10/18/22 [phone]

## 2022-10-17 NOTE — ED Provider Notes (Signed)
[facility]-EMERGENCY DEPT Provider Note   CSN: [id number] Arrival date & time: 10/17/22  1511     History  Chief Complaint  Patient presents with   Wound Check    Shawn Benson is a 42 y.o. male, history of diabetes type 2, who presents to the ED secondary to stepping on a nail last Thursday, while at work.  He states that his foot has been swollen since then, and he saw his typical podiatrist on Monday, and was prescribed Augmentin.  He states that his foot still feels very swollen, and feels like there is something in his foot.  He notes that he cannot feel the bottom of his foot, but the top of it is quite tender.  Has been on Augmentin, without relief of his symptoms.  Has a planned MRI a week or so from now.  It was told to come here by urgent care.  Denies any fevers, chills    Home Medications Prior to Admission medications   Medication Sig Start Date End Date Taking? Authorizing Provider  amoxicillin-clavulanate (AUGMENTIN XR) 1000-62.5 MG 12 hr tablet Take 2 tablets by mouth 2 (two) times daily. 10/17/22  Yes Anh Mangano L, PA  atorvastatin (LIPITOR) 20 MG tablet Take 1 tablet (20 mg total) by mouth daily. Patient not taking: Reported on 07/18/2022 08/02/21   Julieanne Manson, MD  sulfamethoxazole-trimethoprim (BACTRIM DS) 800-160 MG tablet Take 2 tablets by mouth 2 (two) times daily for 10 days. 10/17/22 10/27/22 Yes Jala Dundon L, PA  amoxicillin-clavulanate (AUGMENTIN) 875-125 MG tablet Take 1 tablet by mouth 2 (two) times daily. 10/17/22   Ameri Cahoon L, PA  Blood Glucose Monitoring Suppl (ACCU-CHEK AVIVA PLUS) w/Device KIT Check blood sugar before meals 3 times daily Patient not taking: Reported on 07/18/2022 09/16/20   Julieanne Manson, MD  Continuous Blood Gluc Receiver (FREESTYLE LIBRE 2 READER) DEVI Check blood glucose 4 times daily before meals and at bedtime. Patient not taking: Reported on 07/18/2022 06/28/21   Julieanne Manson, MD   Continuous Blood Gluc Sensor (FREESTYLE LIBRE 2 SENSOR) MISC Check Blood glucose 4 times daily before each meal and at bedtime Patient not taking: Reported on 07/18/2022 06/28/21   Julieanne Manson, MD  gabapentin (NEURONTIN) 300 MG capsule Take 1 capsule (300 mg total) by mouth at bedtime. Patient not taking: Reported on 07/18/2022 12/14/21   Asencion Islam, DPM  insulin NPH-regular Human (NOVOLIN 70/30) (70-30) 100 UNIT/ML injection Inject 52 Units into the skin 2 (two) times daily with a meal. 52 units subcutaneously twice daily before meals 07/10/16   Julieanne Manson, MD  Insulin Syringe-Needle U-100 (INSULIN SYRINGE .5CC/31GX5/16") 31G X 5/16" 0.5 ML MISC 1 Device by Does not apply route 2 (two) times daily with a meal. Patient not taking: Reported on 07/18/2022 10/20/16   Julieanne Manson, MD  ketoconazole (NIZORAL) 2 % cream Apply to both feet and between toes once daily for 6 weeks. Patient not taking: Reported on 07/18/2022 06/26/22   Freddie Breech, DPM  terbinafine (LAMISIL) 250 MG tablet 1 tab by mouth daily for 84 days. Patient not taking: Reported on 07/18/2022 06/28/21   Julieanne Manson, MD      Allergies    Patient has no known allergies.    Review of Systems   Review of Systems  Constitutional:  Negative for fever.  Skin:  Positive for wound.    Physical Exam Updated Vital Signs BP (!) 141/73   Pulse (!) 49   Temp 98.5 F (  36.9 C) (Oral)   Resp 18   Ht 6\' 7"  (2.007 m)   Wt (!) 154.2 kg   SpO2 100%   BMI 38.30 kg/m  Physical Exam Vitals and nursing note reviewed.  Constitutional:      General: He is not in acute distress.    Appearance: He is well-developed.  HENT:     Head: Normocephalic and atraumatic.  Eyes:     Conjunctiva/sclera: Conjunctivae normal.  Cardiovascular:     Rate and Rhythm: Normal rate and regular rhythm.     Heart sounds: No murmur heard. Pulmonary:     Effort: Pulmonary effort is normal. No respiratory distress.      Breath sounds: Normal breath sounds.  Abdominal:     Palpations: Abdomen is soft.     Tenderness: There is no abdominal tenderness.  Musculoskeletal:        General: No swelling.     Cervical back: Neck supple.     Comments: TTP of dorsal aspect of foot predominant along distal metatarsals of digits 2+3. Mild edema of foot. No overt erythema. Numbness of toe 3.   Skin:    General: Skin is warm and dry.     Capillary Refill: Capillary refill takes less than 2 seconds.  Neurological:     Mental Status: He is alert.  Psychiatric:        Mood and Affect: Mood normal.     ED Results / Procedures / Treatments   Labs (all labs ordered are listed, but only abnormal results are displayed) Labs Reviewed  BASIC METABOLIC PANEL - Abnormal; Notable for the following components:      Result Value   Potassium 3.4 (*)    Glucose, Bld 192 (*)    Creatinine, Ser 0.37 (*)    All other components within normal limits  CBG MONITORING, ED - Abnormal; Notable for the following components:   Glucose-Capillary 190 (*)    All other components within normal limits  CBC    EKG None  Radiology CT FOOT LEFT W CONTRAST  Result Date: 10/17/2022 CLINICAL DATA:  stepped on nail, hx of DMII, swollen, eval for abscess EXAM: CT OF THE LOWER LEFT EXTREMITY WITH CONTRAST TECHNIQUE: Multidetector CT imaging of the lower left extremity was performed according to the standard protocol following intravenous contrast administration. RADIATION DOSE REDUCTION: This exam was performed according to the departmental dose-optimization program which includes automated exposure control, adjustment of the mA and/or kV according to patient size and/or use of iterative reconstruction technique. CONTRAST:  157mL OMNIPAQUE IOHEXOL 300 MG/ML  SOLN COMPARISON:  None Available. FINDINGS: Bones/Joint/Cartilage Normal alignment. No acute fracture or dislocation. Mild midfoot degenerative arthritis, most severe involving the talonavicular  articulation. No focal erosion or abnormal periosteal reaction. Ligaments Suboptimally assessed by CT. Muscles and Tendons There is atrophy of intrinsic musculature of the left foot. Tendons appear intact. Soft tissues Mild subcutaneous edema noted within the plantar aspect of the hindfoot. No retained radiopaque foreign body. No subcutaneous fluid collection identified. IMPRESSION: 1. No acute osseous abnormality. 2. Mild subcutaneous edema within the plantar aspect of the hindfoot. No subcutaneous fluid collection or retained radiopaque foreign body. 3. Atrophy of intrinsic musculature of the left foot. 4. Mild midfoot degenerative arthritis, most severe involving the talonavicular articulation. Electronically Signed   By: Fidela Salisbury M.D.   On: 10/17/2022 20:18   DG Foot Complete Left  Result Date: 10/17/2022 CLINICAL DATA:  Stepped on a nail 1 week ago.  Wound. EXAM:  LEFT FOOT - COMPLETE 3 VIEW COMPARISON:  10/15/2022 from outside institution. FINDINGS: Slight hallux valgus deformity of the first ray with some hypertrophic degenerative changes of the first metatarsophalangeal joint. Mild degenerative changes of the dorsal aspect of the midfoot. Well corticated plantar and Achilles calcaneal spurs. No fracture or dislocation. Preserved bone mineralization. No definite soft tissue gas or radiopaque foreign body. Please correlate with the exact location of injury. If there is further concern of soft tissue or bone infection, additional cross-sectional imaging study could be performed as clinically indicated. IMPRESSION: Degenerative changes.  Hallux valgus deformity of the first ray. Electronically Signed   By: Jill Side M.D.   On: 10/17/2022 16:40   DG Foot Complete Left  Result Date: 10/16/2022 Please see detailed radiograph report in office note.   Procedures Procedures    Medications Ordered in ED Medications  iohexol (OMNIPAQUE) 300 MG/ML solution 100 mL (100 mLs Intravenous Contrast Given  10/17/22 1951)    ED Course/ Medical Decision Making/ A&P                           Medical Decision Making Patient is a 42 year old male, who stepped on a nail at his job about a week ago, went to his podiatrist, and was prescribed Augmentin, but states he still has overt swelling of his foot, and is extremely painful to step on.  We will obtain a CT of his foot secondary to persistent symptoms to rule out an abscess, and obtain blood work.  He states he is nervous because the x-ray was not normal, and would like further evaluation thus CT of the foot was performed.  Amount and/or Complexity of Data Reviewed Radiology: ordered.    Details: Edema of the foot, no overt abscess per CT Discussion of management or test interpretation with external provider(s): Discussed with patient, CT findings, given that he has tried Augmentin and still has persistent swelling, concern for possible infection, and initially was only treated with intake, I discussed with the pharmacist on her recommendations.  She states that Augmentin was not sufficient for coverage given the nail injury, and she recommends Bactrim and Augmentin at a higher dose, I prescribed this to the patient, and discussed return precautions, encouraged him to follow-up with his podiatrist.  He voiced understanding.  Risk Prescription drug management.   Final Clinical Impression(s) / ED Diagnoses Final diagnoses:  Visit for wound check  Edema of foot  Foot infection  Cellulitis, unspecified cellulitis site    Rx / DC Orders ED Discharge Orders          Ordered    amoxicillin-clavulanate (AUGMENTIN XR) 1000-62.5 MG 12 hr tablet  2 times daily        10/17/22 2119    sulfamethoxazole-trimethoprim (BACTRIM DS) 800-160 MG tablet  2 times daily        10/17/22 2119    amoxicillin-clavulanate (AUGMENTIN) 875-125 MG tablet  2 times daily        10/17/22 2119              Roxie Kreeger, [place], [place] 10/18/22 1305    Fredia Sorrow,  MD 10/19/22 2046

## 2022-10-23 ENCOUNTER — Telehealth: Payer: Self-pay | Admitting: *Deleted

## 2022-10-23 NOTE — Telephone Encounter (Signed)
Patient is at [facility] for a workman's comp claim,would it be better for him to schedule with our doctor instead of them since he is already an established patient here already?Please advise.

## 2022-10-24 NOTE — Telephone Encounter (Signed)
Patient said that he will be faxing over a letter giving him consent from Urgent Care to continue to see Dr Elisha Ponder since his initial visit was with her.

## 2022-10-24 NOTE — Telephone Encounter (Signed)
Patient is aware of appointment for Friday , will bring paper work from [facility] giving consent to see our doctor.he will fax paper work as well.

## 2022-10-26 ENCOUNTER — Ambulatory Visit (INDEPENDENT_AMBULATORY_CARE_PROVIDER_SITE_OTHER): Admitting: Podiatry

## 2022-10-26 ENCOUNTER — Encounter: Payer: Self-pay | Admitting: Podiatry

## 2022-10-26 VITALS — BP 114/66 | HR 66

## 2022-10-26 DIAGNOSIS — S91332D Puncture wound without foreign body, left foot, subsequent encounter: Secondary | ICD-10-CM | POA: Diagnosis not present

## 2022-10-26 NOTE — Progress Notes (Signed)
Chief Complaint  Patient presents with   Diabetic Ulcer    Diabetic A1c- 12, BG- not taking, stepped on a nail left  forefoot, started 10/11/22,  Rate of pain 8 out of 10, TX: surgical shoes, Augmentin    HPI: 42 y.o. male presenting today for follow-up evaluation of a puncture wound to the left forefoot.  Patient states that on 10/11/2022 he stepped on a nail while at work.  He works at [employer].  He was eventually referred to the urgent care and also seen by Dr. Adah Perl here in our office.  Currently on Augmentin and Bactrim DS.  CT scan left foot completed 10/17/2022.  Patient states that he has been working light duty but he continues to have pain 7/10 on a daily basis.  He wears the postsurgical shoe.  Presenting today for follow-up evaluation and to review the CT scan results  Past Medical History:  Diagnosis Date   Allergy    Seasonal--spring through fall.   Asthma childhood and adult   triggers are allergies and URI.  Spring, summer and fall.   Diabetes mellitus without complication ([place])    Morbid obesity ([place])    Peripheral edema 04/09/2017    Past Surgical History:  Procedure Laterality Date   INCISION AND DRAINAGE PERIRECTAL ABSCESS N/A 10/06/2015   Procedure: IRRIGATION AND DEBRIDEMENT PERIANAL  ABSCESS;  Surgeon: Alphonsa Overall, MD;  Location: [facility];  Service: General;  Laterality: N/A;   KNEE SURGERY     Right and Left Knee    No Known Allergies   Physical Exam: General: The patient is alert and oriented x3 in no acute distress.  Dermatology: Skin is warm, dry and supple bilateral lower extremities. Negative for open lesions or macerations.  It appears that the puncture wound to the plantar aspect of the forefoot has healed.  Complete reepithelialization has occurred  Vascular: Palpable pedal pulses bilaterally. Capillary refill within normal limits.  Negative for any significant edema or erythema  Neurological: Light touch and protective threshold grossly  intact  Musculoskeletal Exam: No pedal deformities noted.  There is tenderness throughout palpation to the left forefoot.  Radiographic Exam LT foot 10/17/2022:  FINDINGS: Slight hallux valgus deformity of the first ray with some hypertrophic degenerative changes of the first metatarsophalangeal joint. Mild degenerative changes of the dorsal aspect of the midfoot. Well corticated plantar and Achilles calcaneal spurs. No fracture or dislocation. Preserved bone mineralization. No definite soft tissue gas or radiopaque foreign body. Please correlate with the exact location of injury. If there is further concern of soft tissue or bone infection, additional cross-sectional imaging study could be performed as clinically indicated.   IMPRESSION: Degenerative changes.  Hallux valgus deformity of the first ray.  CT FOOT LEFT WO CONTRAST 10/17/2022: IMPRESSION: 1. No acute osseous abnormality. 2. Mild subcutaneous edema within the plantar aspect of the hindfoot. No subcutaneous fluid collection or retained radiopaque foreign body. 3. Atrophy of intrinsic musculature of the left foot. 4. Mild midfoot degenerative arthritis, most severe involving the talonavicular articulation.  Assessment: 1.  Puncture wound left forefoot -CT scan results reviewed -Continue Augmentin and Bactrim until completed -Continue WBAT postsurgical shoe.  Patient may begin to slowly transition out of the postsurgical shoe into good supportive tennis shoes and sneakers -Refrain from work 2 weeks.  Currently pain is 7/10 on a daily basis.  We will keep the patient out of work for 2 weeks to allow the foot to rest and heal.  Beginning 11/08/2022  patient may return to work with light duty restrictions:Light duty: No lifting.  No long periods of standing.  Sedentary type work.  Rest as needed. -Return to clinic 4 weeks      Edrick Kins, DPM [facility]  Dr. Edrick Kins, DPM    [address], [place] [address]                Office [phone]  Fax [phone]

## 2022-10-29 ENCOUNTER — Telehealth: Payer: Self-pay | Admitting: *Deleted

## 2022-10-29 NOTE — Telephone Encounter (Signed)
error 

## 2022-11-14 ENCOUNTER — Ambulatory Visit (INDEPENDENT_AMBULATORY_CARE_PROVIDER_SITE_OTHER): Admitting: Podiatry

## 2022-11-14 ENCOUNTER — Encounter: Payer: Self-pay | Admitting: Podiatry

## 2022-11-14 DIAGNOSIS — S91332D Puncture wound without foreign body, left foot, subsequent encounter: Secondary | ICD-10-CM

## 2022-11-14 NOTE — Progress Notes (Signed)
Chief Complaint  Patient presents with   Foot Injury    Patient came in today for a left foot wound, patient is still having pain on the top of the foot,     HPI: 42 y.o. male presenting today for follow-up evaluation of a puncture wound to the left forefoot.  Patient states that on 10/11/2022 he stepped on a nail while at work.  He works at [employer].  He was eventually referred to the urgent care and also seen by Dr. Adah Perl here in our office.  Completed a course of Augmentin and Bactrim DS.  CT scan left foot completed 10/17/2022.  Patient states that he has been working light duty but he continues to have pain 7/10 on a daily basis.  He wears the postsurgical shoe.    Patient is recently returned to work 11/08/2022 on light duty.  He continues to have pain and tenderness to the left forefoot.  He says that now he is unable to move and flex his toes.  They feel very stiff and achy.  Past Medical History:  Diagnosis Date   Allergy    Seasonal--spring through fall.   Asthma childhood and adult   triggers are allergies and URI.  Spring, summer and fall.   Diabetes mellitus without complication ([place])    Morbid obesity ([place])    Peripheral edema 04/09/2017    Past Surgical History:  Procedure Laterality Date   INCISION AND DRAINAGE PERIRECTAL ABSCESS N/A 10/06/2015   Procedure: IRRIGATION AND DEBRIDEMENT PERIANAL  ABSCESS;  Surgeon: Alphonsa Overall, MD;  Location: [facility];  Service: General;  Laterality: N/A;   KNEE SURGERY     Right and Left Knee    No Known Allergies   Physical Exam: General: The patient is alert and oriented x3 in no acute distress.  Dermatology: Skin is warm, dry and supple bilateral lower extremities. Negative for open lesions or macerations.  It appears that the puncture wound to the plantar aspect of the forefoot has healed.  Complete reepithelialization has occurred  Vascular: Palpable pedal pulses bilaterally. Capillary refill within normal limits.  Negative  for any significant edema or erythema  Neurological: Light touch and protective threshold grossly intact  Musculoskeletal Exam: No pedal deformities noted.  There is tenderness throughout palpation to the left forefoot.  Radiographic Exam LT foot 10/17/2022:  FINDINGS: Slight hallux valgus deformity of the first ray with some hypertrophic degenerative changes of the first metatarsophalangeal joint. Mild degenerative changes of the dorsal aspect of the midfoot. Well corticated plantar and Achilles calcaneal spurs. No fracture or dislocation. Preserved bone mineralization. No definite soft tissue gas or radiopaque foreign body. Please correlate with the exact location of injury. If there is further concern of soft tissue or bone infection, additional cross-sectional imaging study could be performed as clinically indicated.   IMPRESSION: Degenerative changes.  Hallux valgus deformity of the first ray.  CT FOOT LEFT WO CONTRAST 10/17/2022: IMPRESSION: 1. No acute osseous abnormality. 2. Mild subcutaneous edema within the plantar aspect of the hindfoot. No subcutaneous fluid collection or retained radiopaque foreign body. 3. Atrophy of intrinsic musculature of the left foot. 4. Mild midfoot degenerative arthritis, most severe involving the talonavicular articulation.  Assessment: 1.  Puncture wound left forefoot -Completed course of Augmentin and Bactrim -Continue WBAT postsurgical shoe.  Patient may begin to slowly transition out of the postsurgical shoe into good supportive tennis shoes and sneakers -Continue light duty at work. Patient may return to work with light  duty restrictions:Light duty: No lifting.  No long periods of standing.  Sedentary type work.  Rest as needed. -Order placed for PT at [facility] -Return to clinic 8 weeks      Edrick Kins, DPM [facility]  Dr. Edrick Kins, DPM    [address], [place] [address]                Office [phone]  Fax [phone]

## 2022-11-16 ENCOUNTER — Telehealth: Payer: Self-pay | Admitting: Podiatry

## 2022-11-16 ENCOUNTER — Encounter: Payer: Self-pay | Admitting: Podiatry

## 2022-11-16 NOTE — Telephone Encounter (Signed)
Pt called and was seen 2.7 and was given a not to return to work but it did not note that he should still be on light duty. He asked if he could get a note stating light duty.   Dr Amalia Hailey note did state pt should be light duty and I have edited the note to state light duty per Dr Amalia Hailey note and emailed to pt.

## 2022-11-21 ENCOUNTER — Other Ambulatory Visit: Payer: Self-pay

## 2022-11-21 ENCOUNTER — Emergency Department (HOSPITAL_COMMUNITY)
Admission: EM | Admit: 2022-11-21 | Discharge: 2022-11-22 | Disposition: A | Discharge status: Home / Self Care | Payer: BC Managed Care – PPO | Attending: Emergency Medicine | Admitting: Emergency Medicine

## 2022-11-21 ENCOUNTER — Encounter (HOSPITAL_COMMUNITY): Payer: Self-pay

## 2022-11-21 DIAGNOSIS — R112 Nausea with vomiting, unspecified: Secondary | ICD-10-CM | POA: Diagnosis not present

## 2022-11-21 DIAGNOSIS — F12188 Cannabis abuse with other cannabis-induced disorder: Secondary | ICD-10-CM | POA: Insufficient documentation

## 2022-11-21 DIAGNOSIS — R109 Unspecified abdominal pain: Secondary | ICD-10-CM | POA: Diagnosis not present

## 2022-11-21 DIAGNOSIS — Z794 Long term (current) use of insulin: Secondary | ICD-10-CM | POA: Insufficient documentation

## 2022-11-21 DIAGNOSIS — R1084 Generalized abdominal pain: Secondary | ICD-10-CM | POA: Diagnosis not present

## 2022-11-21 DIAGNOSIS — R1013 Epigastric pain: Secondary | ICD-10-CM | POA: Diagnosis not present

## 2022-11-21 DIAGNOSIS — R1110 Vomiting, unspecified: Secondary | ICD-10-CM | POA: Diagnosis not present

## 2022-11-21 DIAGNOSIS — F1290 Cannabis use, unspecified, uncomplicated: Secondary | ICD-10-CM

## 2022-11-21 DIAGNOSIS — R739 Hyperglycemia, unspecified: Secondary | ICD-10-CM | POA: Diagnosis not present

## 2022-11-21 MED ORDER — ONDANSETRON HCL 4 MG/2ML IJ SOLN
4.0000 mg | Freq: Once | INTRAMUSCULAR | Status: AC
  Administered 2022-11-21: 4 mg via INTRAVENOUS
  Filled 2022-11-21: qty 2

## 2022-11-21 NOTE — ED Triage Notes (Signed)
Pt states he's been sick since about 15:00 today. Emesis. 1 episode of diarrhea. Abdominal pain. Sore throat.

## 2022-11-21 NOTE — ED Provider Triage Note (Signed)
Emergency Medicine Provider Triage Evaluation Note  Shawn Benson , a 42 y.o. male  was evaluated in triage.  Pt complains of past medical history type 2 diabetes who presents the ED complaining of frequent episodes of nausea and vomiting since 3 PM this afternoon.  He has not taken any antiemetics at home.  He states he has associated diffuse upper abdominal pain.  He states he has no history of recurrent abdominal pain or vomiting.  Blood glucose at home has recently been in the 300-400 range but he states he does not check it daily or regularly.  He denies fever, chills, chest pain, shortness of breath, back pain, dysuria, hematuria, or other symptoms apart from 1 episode of diarrhea since the onset of his other symptoms.  No known sick contacts or known bad foods.  Review of Systems  Positive: See HPI Negative: See HPI  Physical Exam  BP (!) 151/100 (BP Location: Right Arm)   Pulse 75   Temp 97.6 F (36.4 C) (Oral)   Resp 18   SpO2 100%  Gen:   Awake, moderate distress secondary to active vomiting Resp:  Normal effort lungs clear to auscultation MSK:   Moves extremities without difficulty  Other:  Diffuse abdominal tenderness without rebound or guarding  Medical Decision Making  Medically screening exam initiated at 10:23 PM.  Appropriate orders placed.  Shawn Benson was informed that the remainder of the evaluation will be completed by another provider, this initial triage assessment does not replace that evaluation, and the importance of remaining in the ED until their evaluation is complete.     Turner Daniels 11/21/22 2224

## 2022-11-22 ENCOUNTER — Encounter (HOSPITAL_COMMUNITY): Payer: Self-pay

## 2022-11-22 ENCOUNTER — Emergency Department (HOSPITAL_COMMUNITY): Payer: BC Managed Care – PPO

## 2022-11-22 DIAGNOSIS — R109 Unspecified abdominal pain: Secondary | ICD-10-CM | POA: Diagnosis not present

## 2022-11-22 DIAGNOSIS — R1110 Vomiting, unspecified: Secondary | ICD-10-CM | POA: Diagnosis not present

## 2022-11-22 LAB — CBC WITH DIFFERENTIAL/PLATELET
Abs Immature Granulocytes: 0.23 10*3/uL — ABNORMAL HIGH (ref 0.00–0.07)
Basophils Absolute: 0 10*3/uL (ref 0.0–0.1)
Basophils Relative: 0 %
Eosinophils Absolute: 0 10*3/uL (ref 0.0–0.5)
Eosinophils Relative: 0 %
HCT: 47.9 % (ref 39.0–52.0)
Hemoglobin: 15.1 g/dL (ref 13.0–17.0)
Immature Granulocytes: 2 %
Lymphocytes Relative: 8 %
Lymphs Abs: 1.1 10*3/uL (ref 0.7–4.0)
MCH: 26 pg (ref 26.0–34.0)
MCHC: 31.5 g/dL (ref 30.0–36.0)
MCV: 82.6 fL (ref 80.0–100.0)
Monocytes Absolute: 0.3 10*3/uL (ref 0.1–1.0)
Monocytes Relative: 2 %
Neutro Abs: 12.2 10*3/uL — ABNORMAL HIGH (ref 1.7–7.7)
Neutrophils Relative %: 88 %
Platelets: 263 10*3/uL (ref 150–400)
RBC: 5.8 MIL/uL (ref 4.22–5.81)
RDW: 13.8 % (ref 11.5–15.5)
WBC: 13.8 10*3/uL — ABNORMAL HIGH (ref 4.0–10.5)
nRBC: 0 % (ref 0.0–0.2)

## 2022-11-22 LAB — URINALYSIS, ROUTINE W REFLEX MICROSCOPIC
Bacteria, UA: NONE SEEN
Bilirubin Urine: NEGATIVE
Glucose, UA: >=500 mg/dL — AB
Hgb urine dipstick: NEGATIVE
Ketones, ur: 80 mg/dL — AB
Leukocytes,Ua: NEGATIVE
Nitrite: NEGATIVE
Protein, ur: NEGATIVE mg/dL
Specific Gravity, Urine: >1.046 — ABNORMAL HIGH (ref 1.005–1.030)
pH: 5 (ref 5.0–8.0)

## 2022-11-22 LAB — COMPREHENSIVE METABOLIC PANEL
ALT: 12 U/L (ref 0–44)
AST: 17 U/L (ref 15–41)
Albumin: 4.2 g/dL (ref 3.5–5.0)
Alkaline Phosphatase: 101 U/L (ref 38–126)
Anion gap: 13 (ref 5–15)
BUN: 14 mg/dL (ref 6–20)
CO2: 22 mmol/L (ref 22–32)
Calcium: 9.3 mg/dL (ref 8.9–10.3)
Chloride: 101 mmol/L (ref 98–111)
Creatinine, Ser: 0.69 mg/dL (ref 0.61–1.24)
GFR, Estimated: >60 mL/min (ref >60)
Glucose, Bld: 338 mg/dL — ABNORMAL HIGH (ref 70–99)
Potassium: 3.6 mmol/L (ref 3.5–5.1)
Sodium: 136 mmol/L (ref 135–145)
Total Bilirubin: 0.7 mg/dL (ref 0.3–1.2)
Total Protein: 8 g/dL (ref 6.5–8.1)

## 2022-11-22 LAB — LIPASE, BLOOD: Lipase: 27 U/L (ref 11–51)

## 2022-11-22 MED ORDER — DICYCLOMINE HCL 20 MG PO TABS: 20.0000 mg | ORAL_TABLET | Freq: Two times a day (BID) | ORAL | 0 refills | Status: DC

## 2022-11-22 MED ORDER — SODIUM CHLORIDE 0.9 % IV BOLUS
1000.0000 mL | Freq: Once | INTRAVENOUS | Status: AC
  Administered 2022-11-22: 1000 mL via INTRAVENOUS

## 2022-11-22 MED ORDER — MORPHINE SULFATE (PF) 4 MG/ML IV SOLN
4.0000 mg | Freq: Once | INTRAVENOUS | Status: AC
  Administered 2022-11-22: 4 mg via INTRAVENOUS
  Filled 2022-11-22: qty 1

## 2022-11-22 MED ORDER — DROPERIDOL 2.5 MG/ML IJ SOLN
1.2500 mg | Freq: Once | INTRAMUSCULAR | Status: AC
  Administered 2022-11-22: 1.25 mg via INTRAVENOUS
  Filled 2022-11-22: qty 2

## 2022-11-22 MED ORDER — SODIUM CHLORIDE (PF) 0.9 % IJ SOLN
INTRAMUSCULAR | Status: AC
  Filled 2022-11-22: qty 50

## 2022-11-22 MED ORDER — ONDANSETRON HCL 4 MG/2ML IJ SOLN
4.0000 mg | Freq: Once | INTRAMUSCULAR | Status: AC
  Administered 2022-11-22: 4 mg via INTRAVENOUS
  Filled 2022-11-22: qty 2

## 2022-11-22 MED ORDER — METOCLOPRAMIDE HCL 5 MG/ML IJ SOLN
10.0000 mg | Freq: Once | INTRAMUSCULAR | Status: AC
  Administered 2022-11-22: 10 mg via INTRAVENOUS
  Filled 2022-11-22: qty 2

## 2022-11-22 MED ORDER — ONDANSETRON 4 MG PO TBDP: 4.0000 mg | ORAL_TABLET | Freq: Three times a day (TID) | ORAL | 0 refills | Status: DC | PRN

## 2022-11-22 MED ORDER — IOHEXOL 300 MG/ML  SOLN
100.0000 mL | Freq: Once | INTRAMUSCULAR | Status: AC | PRN
  Administered 2022-11-22: 100 mL via INTRAVENOUS

## 2022-11-22 MED ORDER — LACTATED RINGERS IV BOLUS
1000.0000 mL | Freq: Once | INTRAVENOUS | Status: AC
  Administered 2022-11-22: 1000 mL via INTRAVENOUS

## 2022-11-22 NOTE — ED Notes (Signed)
Patient given ginger ale. 

## 2022-11-22 NOTE — Discharge Instructions (Addendum)
Your workup today was reassuring.  No concerning cause of your symptoms on CT scan.  Blood work otherwise reassuring.  You received fluids, medications in the emergency department with improvement in your symptoms.  Your symptoms could be related to the marijuana use.  I recommend you cut back on this.  I have sent a couple medications into the pharmacy for you.  This includes nausea medication and Bentyl which you can use as needed for belly pain.  For any concerning symptoms return to the emergency department otherwise follow-up with your primary care provider.

## 2022-11-22 NOTE — ED Notes (Signed)
Patient given urinal.

## 2022-11-22 NOTE — ED Provider Notes (Signed)
[facility] Provider Note   CSN: [id number] Arrival date & time: 11/21/22  2146     History  Chief Complaint  Patient presents with   Emesis    Shawn Benson is a 42 y.o. male.  42 year old male presents today for evaluation of nausea, vomiting, and abdominal pain that started around 3 PM.  He denies fever, radiation of the pain, dysuria, flank pain.  Denies history of kidney stones.  Denies chest pain, or shortness of breath.  The history is provided by the patient. No language interpreter was used.       Home Medications Prior to Admission medications   Medication Sig Start Date End Date Taking? Authorizing Provider  atorvastatin (LIPITOR) 20 MG tablet Take 1 tablet (20 mg total) by mouth daily. Patient not taking: Reported on 07/18/2022 08/02/21   Mack Hook, MD  amoxicillin-clavulanate (AUGMENTIN XR) 1000-62.5 MG 12 hr tablet Take 2 tablets by mouth 2 (two) times daily. 10/17/22   Small, Brooke L, PA  amoxicillin-clavulanate (AUGMENTIN) 875-125 MG tablet Take 1 tablet by mouth 2 (two) times daily. 10/17/22   Small, Brooke L, PA  Blood Glucose Monitoring Suppl (ACCU-CHEK AVIVA PLUS) w/Device KIT Check blood sugar before meals 3 times daily Patient not taking: Reported on 07/18/2022 09/16/20   Mack Hook, MD  Continuous Blood Gluc Receiver (FREESTYLE LIBRE 2 READER) DEVI Check blood glucose 4 times daily before meals and at bedtime. Patient not taking: Reported on 07/18/2022 06/28/21   Mack Hook, MD  Continuous Blood Gluc Sensor (FREESTYLE LIBRE 2 SENSOR) MISC Check Blood glucose 4 times daily before each meal and at bedtime Patient not taking: Reported on 07/18/2022 06/28/21   Mack Hook, MD  gabapentin (NEURONTIN) 300 MG capsule Take 1 capsule (300 mg total) by mouth at bedtime. Patient not taking: Reported on 07/18/2022 12/14/21   Landis Martins, DPM  insulin NPH-regular Human (NOVOLIN 70/30)  (70-30) 100 UNIT/ML injection Inject 52 Units into the skin 2 (two) times daily with a meal. 52 units subcutaneously twice daily before meals 07/10/16   Mack Hook, MD  Insulin Syringe-Needle U-100 (INSULIN SYRINGE .5CC/31GX5/16") 31G X 5/16" 0.5 ML MISC 1 Device by Does not apply route 2 (two) times daily with a meal. Patient not taking: Reported on 07/18/2022 10/20/16   Mack Hook, MD  ketoconazole (NIZORAL) 2 % cream Apply to both feet and between toes once daily for 6 weeks. Patient not taking: Reported on 07/18/2022 06/26/22   Marzetta Board, DPM  terbinafine (LAMISIL) 250 MG tablet 1 tab by mouth daily for 84 days. Patient not taking: Reported on 07/18/2022 06/28/21   Mack Hook, MD      Allergies    Patient has no known allergies.    Review of Systems   Review of Systems  Constitutional:  Negative for chills and fever.  Respiratory:  Negative for shortness of breath.   Cardiovascular:  Negative for chest pain.  Gastrointestinal:  Positive for abdominal pain, nausea and vomiting.  Neurological:  Negative for light-headedness.  All other systems reviewed and are negative.   Physical Exam Updated Vital Signs BP (!) 140/74   Pulse 70   Temp 97.6 F (36.4 C) (Oral)   Resp 18   SpO2 97%  Physical Exam Vitals and nursing note reviewed.  Constitutional:      General: He is not in acute distress.    Appearance: Normal appearance. He is not ill-appearing.  HENT:     Head:  Normocephalic and atraumatic.     Nose: Nose normal.  Eyes:     General: No scleral icterus.    Extraocular Movements: Extraocular movements intact.     Conjunctiva/sclera: Conjunctivae normal.  Cardiovascular:     Rate and Rhythm: Normal rate and regular rhythm.     Pulses: Normal pulses.  Pulmonary:     Effort: Pulmonary effort is normal. No respiratory distress.     Breath sounds: Normal breath sounds. No wheezing or rales.  Abdominal:     General: There is no distension.      Palpations: Abdomen is soft.     Tenderness: There is no abdominal tenderness. There is no guarding.  Musculoskeletal:        General: Normal range of motion.     Cervical back: Normal range of motion.  Skin:    General: Skin is warm and dry.  Neurological:     General: No focal deficit present.     Mental Status: He is alert. Mental status is at baseline.     ED Results / Procedures / Treatments   Labs (all labs ordered are listed, but only abnormal results are displayed) Labs Reviewed  CBC WITH DIFFERENTIAL/PLATELET - Abnormal; Notable for the following components:      Result Value   WBC 13.8 (*)    Neutro Abs 12.2 (*)    Abs Immature Granulocytes 0.23 (*)    All other components within normal limits  COMPREHENSIVE METABOLIC PANEL - Abnormal; Notable for the following components:   Glucose, Bld 338 (*)    All other components within normal limits  LIPASE, BLOOD  URINALYSIS, ROUTINE W REFLEX MICROSCOPIC    EKG None  Radiology No results found.  Procedures Procedures    Medications Ordered in ED Medications  ondansetron (ZOFRAN) injection 4 mg (has no administration in time range)  morphine (PF) 4 MG/ML injection 4 mg (has no administration in time range)  ondansetron (ZOFRAN) injection 4 mg (4 mg Intravenous Given 11/21/22 2353)    ED Course/ Medical Decision Making/ A&P Clinical Course as of 11/22/22 0620  Thu Nov 22, 2022  0344 CBC with mild leukocytosis, with mild left shift.  No anemia.  CMP with glucose of 338 otherwise without acute findings.  UA with some ketones otherwise no evidence of UTI.  Lipase within normal limits.  CT abdomen pelvis with contrast without acute intra-abdominal finding to explain patient's abdominal pain.  On reevaluation he reports improvement in pain.  He did have another episode of emesis.  Was recently dosed with Zofran.  Patient is resting comfortably on exam with his eyes closed.  He is requesting food and ginger ale.  Mild  tachycardia noted.  Will give additional fluid bolus, and wait about 10 to 15 minutes and p.o. challenge. Plan  discussed with nurse. [AA]  0444 Failed p.o. challenge.  Upon further history patient does endorse using marijuana every day.  Will give Reglan and droperidol. [AA]    Clinical Course User Index [AA] Evlyn Courier, PA-C                             Medical Decision Making Amount and/or Complexity of Data Reviewed Radiology: ordered.  Risk Prescription drug management.   Medical Decision Making / ED Course   This patient presents to the ED for concern of emesis, abdominal pain, this involves an extensive number of treatment options, and is a complaint that carries with  it a high risk of complications and morbidity.  The differential diagnosis includes gastroenteritis, pancreatitis, appendicitis, diverticulitis, cannabinoid hyperemesis syndrome  MDM: 42 year old male presents with above-mentioned complaints.  Endorses marijuana use daily.  Has had multiple episodes of emesis in the emergency department.  Symptoms improve after he received a dose of droperidol and Reglan.  No hematemesis.  Patient discharged with Zofran, Bentyl.  Workup as noted above and ED course.  Ultimately passed p.o. challenge.  Patient is appropriate for discharge.  Strict return precaution discussed.  Discussed follow-up with PCP.  Discussed cutting back on marijuana use.  This is likely related to cannabinoid use.  Lab Tests: -I ordered, reviewed, and interpreted labs.   The pertinent results include:   Labs Reviewed  CBC WITH DIFFERENTIAL/PLATELET - Abnormal; Notable for the following components:      Result Value   WBC 13.8 (*)    Neutro Abs 12.2 (*)    Abs Immature Granulocytes 0.23 (*)    All other components within normal limits  COMPREHENSIVE METABOLIC PANEL - Abnormal; Notable for the following components:   Glucose, Bld 338 (*)    All other components within normal limits  URINALYSIS, ROUTINE  W REFLEX MICROSCOPIC - Abnormal; Notable for the following components:   Color, Urine STRAW (*)    Specific Gravity, Urine >1.046 (*)    Glucose, UA >=500 (*)    Ketones, ur 80 (*)    All other components within normal limits  LIPASE, BLOOD      EKG  EKG Interpretation  Date/Time:    Ventricular Rate:    PR Interval:    QRS Duration:   QT Interval:    QTC Calculation:   R Axis:     Text Interpretation:           Imaging Studies ordered: I ordered imaging studies including CT abdomen pelvis with contrast I independently visualized and interpreted imaging. I agree with the radiologist interpretation   Medicines ordered and prescription drug management: Meds ordered this encounter  Medications   ondansetron (ZOFRAN) injection 4 mg   ondansetron (ZOFRAN) injection 4 mg   morphine (PF) 4 MG/ML injection 4 mg   sodium chloride 0.9 % bolus 1,000 mL   iohexol (OMNIPAQUE) 300 MG/ML solution 100 mL   sodium chloride (PF) 0.9 % injection    Angelica Chessman M: cabinet override   ondansetron [facility]) injection 4 mg   lactated ringers bolus 1,000 mL   metoCLOPramide (REGLAN) injection 10 mg   droperidol (INAPSINE) 2.5 MG/ML injection 1.25 mg   dicyclomine (BENTYL) 20 MG tablet    Sig: Take 1 tablet (20 mg total) by mouth 2 (two) times daily.    Dispense:  20 tablet    Refill:  0    Order Specific Question:   Supervising Provider    Answer:   MILLER, BRIAN [[id number]]   ondansetron (ZOFRAN-ODT) 4 MG disintegrating tablet    Sig: Take 1 tablet (4 mg total) by mouth every 8 (eight) hours as needed.    Dispense:  20 tablet    Refill:  0    Order Specific Question:   Supervising Provider    Answer:   MILLER, BRIAN [[id number]]    -I have reviewed the patients home medicines and have made adjustments as needed  Critical interventions Fluid bolus, nausea medications   Reevaluation: After the interventions noted above, I reevaluated the patient and found that they have  :improved  Co morbidities that complicate the patient evaluation  Past Medical History:  Diagnosis Date   Allergy    Seasonal--spring through fall.   Asthma childhood and adult   triggers are allergies and URI.  Spring, summer and fall.   Diabetes mellitus without complication ([place])    Morbid obesity ([place])    Peripheral edema 04/09/2017      Dispostion: Patient stable for discharge.  Strict return precaution discussed.  Patient voices understanding and is in agreement with plan.   Final Clinical Impression(s) / ED Diagnoses Final diagnoses:  Nausea and vomiting, unspecified vomiting type  Cannabinoid hyperemesis syndrome    Rx / DC Orders ED Discharge Orders          Ordered    dicyclomine (BENTYL) 20 MG tablet  2 times daily        11/22/22 0620    ondansetron (ZOFRAN-ODT) 4 MG disintegrating tablet  Every 8 hours PRN        11/22/22 0620              Evlyn Courier, PA-C 11/22/22 [id number]    Ezequiel Essex, MD 11/22/22 [id number]

## 2022-11-22 NOTE — ED Notes (Signed)
Patient is resting comfortably. 

## 2022-11-26 ENCOUNTER — Encounter: Payer: Self-pay | Admitting: Nurse Practitioner

## 2022-11-26 ENCOUNTER — Other Ambulatory Visit: Payer: Self-pay

## 2022-11-26 ENCOUNTER — Inpatient Hospital Stay (HOSPITAL_COMMUNITY)
Admission: EM | Admit: 2022-11-26 | Discharge: 2022-11-30 | DRG: 074 | Disposition: A | Discharge status: Home / Self Care | Payer: BC Managed Care – PPO | Attending: Internal Medicine | Admitting: Internal Medicine

## 2022-11-26 ENCOUNTER — Emergency Department (HOSPITAL_COMMUNITY): Payer: BC Managed Care – PPO

## 2022-11-26 ENCOUNTER — Encounter (HOSPITAL_COMMUNITY): Payer: Self-pay

## 2022-11-26 DIAGNOSIS — K3184 Gastroparesis: Secondary | ICD-10-CM | POA: Diagnosis not present

## 2022-11-26 DIAGNOSIS — G4730 Sleep apnea, unspecified: Secondary | ICD-10-CM | POA: Diagnosis not present

## 2022-11-26 DIAGNOSIS — E1142 Type 2 diabetes mellitus with diabetic polyneuropathy: Secondary | ICD-10-CM | POA: Insufficient documentation

## 2022-11-26 DIAGNOSIS — R531 Weakness: Secondary | ICD-10-CM | POA: Diagnosis not present

## 2022-11-26 DIAGNOSIS — Z794 Long term (current) use of insulin: Secondary | ICD-10-CM | POA: Diagnosis not present

## 2022-11-26 DIAGNOSIS — Z713 Dietary counseling and surveillance: Secondary | ICD-10-CM

## 2022-11-26 DIAGNOSIS — E860 Dehydration: Secondary | ICD-10-CM | POA: Diagnosis present

## 2022-11-26 DIAGNOSIS — E1165 Type 2 diabetes mellitus with hyperglycemia: Secondary | ICD-10-CM | POA: Diagnosis present

## 2022-11-26 DIAGNOSIS — R1310 Dysphagia, unspecified: Secondary | ICD-10-CM | POA: Diagnosis not present

## 2022-11-26 DIAGNOSIS — E871 Hypo-osmolality and hyponatremia: Secondary | ICD-10-CM | POA: Diagnosis not present

## 2022-11-26 DIAGNOSIS — E8729 Other acidosis: Secondary | ICD-10-CM | POA: Diagnosis present

## 2022-11-26 DIAGNOSIS — J302 Other seasonal allergic rhinitis: Secondary | ICD-10-CM | POA: Diagnosis present

## 2022-11-26 DIAGNOSIS — R112 Nausea with vomiting, unspecified: Secondary | ICD-10-CM | POA: Insufficient documentation

## 2022-11-26 DIAGNOSIS — D72829 Elevated white blood cell count, unspecified: Secondary | ICD-10-CM | POA: Diagnosis not present

## 2022-11-26 DIAGNOSIS — Z79899 Other long term (current) drug therapy: Secondary | ICD-10-CM | POA: Diagnosis not present

## 2022-11-26 DIAGNOSIS — R739 Hyperglycemia, unspecified: Principal | ICD-10-CM

## 2022-11-26 DIAGNOSIS — R1110 Vomiting, unspecified: Secondary | ICD-10-CM | POA: Diagnosis not present

## 2022-11-26 DIAGNOSIS — E876 Hypokalemia: Secondary | ICD-10-CM | POA: Diagnosis not present

## 2022-11-26 DIAGNOSIS — E785 Hyperlipidemia, unspecified: Secondary | ICD-10-CM | POA: Diagnosis not present

## 2022-11-26 DIAGNOSIS — E1143 Type 2 diabetes mellitus with diabetic autonomic (poly)neuropathy: Secondary | ICD-10-CM | POA: Diagnosis not present

## 2022-11-26 DIAGNOSIS — E119 Type 2 diabetes mellitus without complications: Secondary | ICD-10-CM

## 2022-11-26 DIAGNOSIS — Z6838 Body mass index (BMI) 38.0-38.9, adult: Secondary | ICD-10-CM

## 2022-11-26 DIAGNOSIS — E874 Mixed disorder of acid-base balance: Secondary | ICD-10-CM | POA: Diagnosis present

## 2022-11-26 DIAGNOSIS — F17210 Nicotine dependence, cigarettes, uncomplicated: Secondary | ICD-10-CM | POA: Diagnosis not present

## 2022-11-26 DIAGNOSIS — Z833 Family history of diabetes mellitus: Secondary | ICD-10-CM

## 2022-11-26 DIAGNOSIS — K449 Diaphragmatic hernia without obstruction or gangrene: Secondary | ICD-10-CM | POA: Diagnosis not present

## 2022-11-26 DIAGNOSIS — Z0389 Encounter for observation for other suspected diseases and conditions ruled out: Secondary | ICD-10-CM | POA: Diagnosis not present

## 2022-11-26 LAB — COMPREHENSIVE METABOLIC PANEL
ALT: 12 U/L (ref 0–44)
AST: 10 U/L — ABNORMAL LOW (ref 15–41)
Albumin: 3.4 g/dL — ABNORMAL LOW (ref 3.5–5.0)
Alkaline Phosphatase: 84 U/L (ref 38–126)
Anion gap: 17 — ABNORMAL HIGH (ref 5–15)
BUN: 16 mg/dL (ref 6–20)
CO2: 20 mmol/L — ABNORMAL LOW (ref 22–32)
Calcium: 8.4 mg/dL — ABNORMAL LOW (ref 8.9–10.3)
Chloride: 96 mmol/L — ABNORMAL LOW (ref 98–111)
Creatinine, Ser: 0.89 mg/dL (ref 0.61–1.24)
GFR, Estimated: >60 mL/min (ref >60)
Glucose, Bld: 423 mg/dL — ABNORMAL HIGH (ref 70–99)
Potassium: 3.2 mmol/L — ABNORMAL LOW (ref 3.5–5.1)
Sodium: 133 mmol/L — ABNORMAL LOW (ref 135–145)
Total Bilirubin: 2 mg/dL — ABNORMAL HIGH (ref 0.3–1.2)
Total Protein: 6.8 g/dL (ref 6.5–8.1)

## 2022-11-26 LAB — BASIC METABOLIC PANEL
Anion gap: 13 (ref 5–15)
Anion gap: 15 (ref 5–15)
BUN: 15 mg/dL (ref 6–20)
BUN: 14 mg/dL (ref 6–20)
CO2: 23 mmol/L (ref 22–32)
CO2: 21 mmol/L — ABNORMAL LOW (ref 22–32)
Calcium: 8.6 mg/dL — ABNORMAL LOW (ref 8.9–10.3)
Calcium: 8.5 mg/dL — ABNORMAL LOW (ref 8.9–10.3)
Chloride: 98 mmol/L (ref 98–111)
Chloride: 97 mmol/L — ABNORMAL LOW (ref 98–111)
Creatinine, Ser: 0.72 mg/dL (ref 0.61–1.24)
Creatinine, Ser: 0.75 mg/dL (ref 0.61–1.24)
GFR, Estimated: >60 mL/min (ref >60)
GFR, Estimated: >60 mL/min (ref >60)
Glucose, Bld: 316 mg/dL — ABNORMAL HIGH (ref 70–99)
Glucose, Bld: 272 mg/dL — ABNORMAL HIGH (ref 70–99)
Potassium: 3 mmol/L — ABNORMAL LOW (ref 3.5–5.1)
Potassium: 2.8 mmol/L — ABNORMAL LOW (ref 3.5–5.1)
Sodium: 134 mmol/L — ABNORMAL LOW (ref 135–145)
Sodium: 133 mmol/L — ABNORMAL LOW (ref 135–145)

## 2022-11-26 LAB — CBC WITH DIFFERENTIAL/PLATELET
Abs Immature Granulocytes: 0.03 10*3/uL (ref 0.00–0.07)
Basophils Absolute: 0.1 10*3/uL (ref 0.0–0.1)
Basophils Relative: 0 %
Eosinophils Absolute: 0.1 10*3/uL (ref 0.0–0.5)
Eosinophils Relative: 1 %
HCT: 51 % (ref 39.0–52.0)
Hemoglobin: 16.8 g/dL (ref 13.0–17.0)
Immature Granulocytes: 0 %
Lymphocytes Relative: 17 %
Lymphs Abs: 2.4 10*3/uL (ref 0.7–4.0)
MCH: 26.1 pg (ref 26.0–34.0)
MCHC: 32.9 g/dL (ref 30.0–36.0)
MCV: 79.3 fL — ABNORMAL LOW (ref 80.0–100.0)
Monocytes Absolute: 1.3 10*3/uL — ABNORMAL HIGH (ref 0.1–1.0)
Monocytes Relative: 9 %
Neutro Abs: 10.2 10*3/uL — ABNORMAL HIGH (ref 1.7–7.7)
Neutrophils Relative %: 73 %
Platelets: 292 10*3/uL (ref 150–400)
RBC: 6.43 MIL/uL — ABNORMAL HIGH (ref 4.22–5.81)
RDW: 14.1 % (ref 11.5–15.5)
WBC: 14.1 10*3/uL — ABNORMAL HIGH (ref 4.0–10.5)
nRBC: 0 % (ref 0.0–0.2)

## 2022-11-26 LAB — CBG MONITORING, ED
Glucose-Capillary: 439 mg/dL — ABNORMAL HIGH (ref 70–99)
Glucose-Capillary: 472 mg/dL — ABNORMAL HIGH (ref 70–99)
Glucose-Capillary: 350 mg/dL — ABNORMAL HIGH (ref 70–99)

## 2022-11-26 LAB — URINALYSIS, ROUTINE W REFLEX MICROSCOPIC
Bacteria, UA: NONE SEEN
Bilirubin Urine: NEGATIVE
Glucose, UA: >=500 mg/dL — AB
Hgb urine dipstick: NEGATIVE
Ketones, ur: 80 mg/dL — AB
Leukocytes,Ua: NEGATIVE
Nitrite: NEGATIVE
Protein, ur: NEGATIVE mg/dL
Specific Gravity, Urine: 1.016 (ref 1.005–1.030)
pH: 5 (ref 5.0–8.0)

## 2022-11-26 LAB — BLOOD GAS, VENOUS
Acid-base deficit: 1.8 mmol/L (ref 0.0–2.0)
Bicarbonate: 20.9 mmol/L (ref 20.0–28.0)
O2 Saturation: 78 %
Patient temperature: 37
pCO2, Ven: 30 mmHg — ABNORMAL LOW (ref 44–60)
pH, Ven: 7.45 — ABNORMAL HIGH (ref 7.25–7.43)
pO2, Ven: 43 mmHg (ref 32–45)

## 2022-11-26 LAB — HEMOGLOBIN A1C
Hgb A1c MFr Bld: 12.9 % — ABNORMAL HIGH (ref 4.8–5.6)
Mean Plasma Glucose: 323.53 mg/dL

## 2022-11-26 LAB — LIPASE, BLOOD: Lipase: 30 U/L (ref 11–51)

## 2022-11-26 LAB — BETA-HYDROXYBUTYRIC ACID: Beta-Hydroxybutyric Acid: 2.72 mmol/L — ABNORMAL HIGH (ref 0.05–0.27)

## 2022-11-26 LAB — GLUCOSE, CAPILLARY: Glucose-Capillary: 276 mg/dL — ABNORMAL HIGH (ref 70–99)

## 2022-11-26 MED ORDER — INSULIN ASPART 100 UNIT/ML IJ SOLN
10.0000 [IU] | Freq: Once | INTRAMUSCULAR | Status: AC
  Administered 2022-11-26: 10 [IU] via SUBCUTANEOUS
  Filled 2022-11-26: qty 0.1

## 2022-11-26 MED ORDER — LACTATED RINGERS IV SOLN: INTRAVENOUS | Status: DC

## 2022-11-26 MED ORDER — RIVAROXABAN 10 MG PO TABS
10.0000 mg | ORAL_TABLET | Freq: Every day | ORAL | Status: DC
  Administered 2022-11-26 – 2022-11-29 (×4): 10 mg via ORAL
  Filled 2022-11-26 (×4): qty 1

## 2022-11-26 MED ORDER — INSULIN ASPART 100 UNIT/ML IJ SOLN
15.0000 [IU] | Freq: Once | INTRAMUSCULAR | Status: AC
  Administered 2022-11-26: 15 [IU] via SUBCUTANEOUS
  Filled 2022-11-26: qty 0.15

## 2022-11-26 MED ORDER — INSULIN ASPART 100 UNIT/ML IJ SOLN
0.0000 [IU] | INTRAMUSCULAR | Status: DC
  Administered 2022-11-26: 11 [IU] via SUBCUTANEOUS
  Administered 2022-11-27 (×2): 7 [IU] via SUBCUTANEOUS
  Administered 2022-11-27: 11 [IU] via SUBCUTANEOUS
  Administered 2022-11-27: 20 [IU] via SUBCUTANEOUS
  Administered 2022-11-27: 4 [IU] via SUBCUTANEOUS
  Administered 2022-11-28: 7 [IU] via SUBCUTANEOUS
  Administered 2022-11-28 (×2): 11 [IU] via SUBCUTANEOUS
  Administered 2022-11-28: 7 [IU] via SUBCUTANEOUS
  Administered 2022-11-28: 11 [IU] via SUBCUTANEOUS
  Administered 2022-11-29: 7 [IU] via SUBCUTANEOUS
  Administered 2022-11-29 (×2): 3 [IU] via SUBCUTANEOUS
  Administered 2022-11-29 – 2022-11-30 (×3): 4 [IU] via SUBCUTANEOUS
  Administered 2022-11-30: 3 [IU] via SUBCUTANEOUS
  Administered 2022-11-30: 7 [IU] via SUBCUTANEOUS
  Administered 2022-11-30: 3 [IU] via SUBCUTANEOUS

## 2022-11-26 MED ORDER — ACETAMINOPHEN 325 MG PO TABS
650.0000 mg | ORAL_TABLET | Freq: Four times a day (QID) | ORAL | Status: DC | PRN
  Administered 2022-11-28: 650 mg via ORAL
  Filled 2022-11-26: qty 2

## 2022-11-26 MED ORDER — KETOROLAC TROMETHAMINE 30 MG/ML IJ SOLN
30.0000 mg | Freq: Four times a day (QID) | INTRAMUSCULAR | Status: DC | PRN
  Administered 2022-11-27: 30 mg via INTRAVENOUS
  Filled 2022-11-26: qty 1

## 2022-11-26 MED ORDER — ALUM & MAG HYDROXIDE-SIMETH 200-200-20 MG/5ML PO SUSP
30.0000 mL | ORAL | Status: DC | PRN
  Administered 2022-11-27 – 2022-11-28 (×2): 30 mL via ORAL
  Filled 2022-11-26 (×3): qty 30

## 2022-11-26 MED ORDER — KCL-LACTATED RINGERS 20 MEQ/L IV SOLN
INTRAVENOUS | Status: DC
  Filled 2022-11-26: qty 1000

## 2022-11-26 MED ORDER — POTASSIUM CHLORIDE 20 MEQ PO PACK
20.0000 meq | PACK | Freq: Once | ORAL | Status: AC
  Administered 2022-11-26: 20 meq via ORAL
  Filled 2022-11-26: qty 1

## 2022-11-26 MED ORDER — POTASSIUM CHLORIDE 10 MEQ/100ML IV SOLN
10.0000 meq | INTRAVENOUS | Status: AC
  Administered 2022-11-26 – 2022-11-27 (×4): 10 meq via INTRAVENOUS
  Filled 2022-11-26 (×4): qty 100

## 2022-11-26 MED ORDER — ACETAMINOPHEN 650 MG RE SUPP: 650.0000 mg | Freq: Four times a day (QID) | RECTAL | Status: DC | PRN

## 2022-11-26 MED ORDER — POTASSIUM CHLORIDE 2 MEQ/ML IV SOLN
INTRAVENOUS | Status: DC
  Filled 2022-11-26 (×14): qty 1000

## 2022-11-26 MED ORDER — SODIUM CHLORIDE 0.9% FLUSH
3.0000 mL | Freq: Two times a day (BID) | INTRAVENOUS | Status: DC
  Administered 2022-11-28 – 2022-11-29 (×3): 3 mL via INTRAVENOUS

## 2022-11-26 MED ORDER — GABAPENTIN 300 MG PO CAPS
300.0000 mg | ORAL_CAPSULE | Freq: Every day | ORAL | Status: DC
  Administered 2022-11-26 – 2022-11-29 (×4): 300 mg via ORAL
  Filled 2022-11-26 (×4): qty 1

## 2022-11-26 MED ORDER — LACTATED RINGERS IV BOLUS
2000.0000 mL | Freq: Once | INTRAVENOUS | Status: AC
  Administered 2022-11-26: 2000 mL via INTRAVENOUS

## 2022-11-26 MED ORDER — ONDANSETRON HCL 4 MG PO TABS: 4.0000 mg | ORAL_TABLET | Freq: Four times a day (QID) | ORAL | Status: DC | PRN

## 2022-11-26 MED ORDER — ONDANSETRON HCL 4 MG/2ML IJ SOLN
4.0000 mg | Freq: Four times a day (QID) | INTRAMUSCULAR | Status: DC | PRN
  Administered 2022-11-26 – 2022-11-29 (×4): 4 mg via INTRAVENOUS
  Filled 2022-11-26 (×4): qty 2

## 2022-11-26 NOTE — ED Notes (Signed)
ED TO INPATIENT HANDOFF REPORT  Name/Age/Gender Shawn Benson 42 y.o. male  Code Status    Code Status Orders  (From admission, onward)           Start     Ordered   11/26/22 1735  Full code  Continuous       Question:  By:  Answer:  Consent: discussion documented in EHR   11/26/22 1736           Code Status History     Date Active Date Inactive Code Status Order ID Comments User Context   10/06/2015 0121 10/07/2015 1726 Full Code [id number]  Reubin Milan, MD Inpatient   09/30/2015 1547 10/04/2015 0002 Full Code [id number]  Donne Hazel, MD ED       Home/SNF/Other Home  Chief Complaint High anion gap metabolic acidosis [id number]  Level of Care/Admitting Diagnosis ED Disposition     ED Disposition  Admit   Condition  --   Comment  Hospital Area: [facility] [[id number]]  Level of Care: Med-Surg [16]  May admit patient to [facility] or [facility] if equivalent level of care is available:: Yes  Covid Evaluation: Asymptomatic - no recent exposure (last 10 days) testing not required  Diagnosis: High anion gap metabolic acidosis [id number]  Admitting Physician: Sid Falcon [phone]  Attending Physician: Sid Falcon [id number]  Certification:: I certify this patient will need inpatient services for at least 2 midnights  Estimated Length of Stay: 2          Medical History Past Medical History:  Diagnosis Date   Allergy    Seasonal--spring through fall.   Asthma childhood and adult   triggers are allergies and URI.  Spring, summer and fall.   Diabetes mellitus without complication ([place])    Morbid obesity ([place])    Peripheral edema 04/09/2017    Allergies No Known Allergies  IV Location/Drains/Wounds Patient Lines/Drains/Airways Status     Active Line/Drains/Airways     Name Placement date Placement time Site Days   Peripheral IV 11/26/22 20 G Anterior;Right Forearm 11/26/22  1452  Forearm  less than 1   Wound /  Incision (Open or Dehisced) 09/30/15 Incision - Open;Other (Comment) Abdomen Left;Lower wdl  09/30/15  1623  Abdomen  2614   Wound / Incision (Open or Dehisced) 10/05/15 Other (Comment) Buttocks Right;Medial RIGHT INNER BUTTOCK 10/05/15  1539  Buttocks  2609            Labs/Imaging Results for orders placed or performed during the hospital encounter of 11/26/22 (from the past 48 hour(s))  CBG monitoring, ED     Status: Abnormal   Collection Time: 11/26/22  2:15 PM  Result Value Ref Range   Glucose-Capillary 439 (H) 70 - 99 mg/dL    Comment: Glucose reference range applies only to samples taken after fasting for at least 8 hours.  CBG monitoring, ED     Status: Abnormal   Collection Time: 11/26/22  2:35 PM  Result Value Ref Range   Glucose-Capillary 472 (H) 70 - 99 mg/dL    Comment: Glucose reference range applies only to samples taken after fasting for at least 8 hours.  CBC with Differential/Platelet     Status: Abnormal   Collection Time: 11/26/22  2:55 PM  Result Value Ref Range   WBC 14.1 (H) 4.0 - 10.5 K/uL   RBC 6.43 (H) 4.22 - 5.81 MIL/uL   Hemoglobin 16.8 13.0 - 17.0 g/dL  HCT 51.0 39.0 - 52.0 %   MCV 79.3 (L) 80.0 - 100.0 fL   MCH 26.1 26.0 - 34.0 pg   MCHC 32.9 30.0 - 36.0 g/dL   RDW 14.1 11.5 - 15.5 %   Platelets 292 150 - 400 K/uL   nRBC 0.0 0.0 - 0.2 %   Neutrophils Relative % 73 %   Neutro Abs 10.2 (H) 1.7 - 7.7 K/uL   Lymphocytes Relative 17 %   Lymphs Abs 2.4 0.7 - 4.0 K/uL   Monocytes Relative 9 %   Monocytes Absolute 1.3 (H) 0.1 - 1.0 K/uL   Eosinophils Relative 1 %   Eosinophils Absolute 0.1 0.0 - 0.5 K/uL   Basophils Relative 0 %   Basophils Absolute 0.1 0.0 - 0.1 K/uL   Immature Granulocytes 0 %   Abs Immature Granulocytes 0.03 0.00 - 0.07 K/uL    Comment: Performed at [facility], [place] [address] [place], [place] [address]  Comprehensive metabolic panel     Status: Abnormal   Collection Time: 11/26/22  2:55 PM  Result Value  Ref Range   Sodium 133 (L) 135 - 145 mmol/L   Potassium 3.2 (L) 3.5 - 5.1 mmol/L   Chloride 96 (L) 98 - 111 mmol/L   CO2 20 (L) 22 - 32 mmol/L   Glucose, Bld 423 (H) 70 - 99 mg/dL    Comment: Glucose reference range applies only to samples taken after fasting for at least 8 hours.   BUN 16 6 - 20 mg/dL   Creatinine, Ser 0.89 0.61 - 1.24 mg/dL   Calcium 8.4 (L) 8.9 - 10.3 mg/dL   Total Protein 6.8 6.5 - 8.1 g/dL   Albumin 3.4 (L) 3.5 - 5.0 g/dL   AST 10 (L) 15 - 41 U/L   ALT 12 0 - 44 U/L   Alkaline Phosphatase 84 38 - 126 U/L   Total Bilirubin 2.0 (H) 0.3 - 1.2 mg/dL   GFR, Estimated >60 >60 mL/min    Comment: (NOTE) Calculated using the CKD-EPI Creatinine Equation (2021)    Anion gap 17 (H) 5 - 15    Comment: Performed at [facility], [place] [address] [place], [place] [address]  Lipase, blood     Status: None   Collection Time: 11/26/22  2:55 PM  Result Value Ref Range   Lipase 30 11 - 51 U/L    Comment: Performed at [facility], [place] [address] [place], [place] [address]  Blood gas, venous     Status: Abnormal   Collection Time: 11/26/22  2:55 PM  Result Value Ref Range   pH, Ven 7.45 (H) 7.25 - 7.43   pCO2, Ven 30 (L) 44 - 60 mmHg   pO2, Ven 43 32 - 45 mmHg   Bicarbonate 20.9 20.0 - 28.0 mmol/L   Acid-base deficit 1.8 0.0 - 2.0 mmol/L   O2 Saturation 78 %   Patient temperature 37.0     Comment: Performed at [facility], [place] [address], [place], [place] [address]  CBG monitoring, ED     Status: Abnormal   Collection Time: 11/26/22  4:35 PM  Result Value Ref Range   Glucose-Capillary 350 (H) 70 - 99 mg/dL    Comment: Glucose reference range applies only to samples taken after fasting for at least 8 hours.  Urinalysis, Routine w reflex microscopic -Urine, Clean Catch     Status: Abnormal   Collection Time: 11/26/22  5:01 PM  Result Value Ref Range   Color, Urine STRAW (A) YELLOW   APPearance CLEAR CLEAR    Specific Gravity, Urine 1.016 1.005 - 1.030   pH 5.0 5.0 - 8.0   Glucose, UA >=500 (A) NEGATIVE mg/dL   Hgb urine dipstick NEGATIVE NEGATIVE   Bilirubin Urine NEGATIVE NEGATIVE   Ketones, ur 80 (A) NEGATIVE mg/dL   Protein, ur NEGATIVE NEGATIVE mg/dL   Nitrite NEGATIVE NEGATIVE   Leukocytes,Ua NEGATIVE NEGATIVE   RBC / HPF 0-5 0 - 5 RBC/hpf   WBC, UA 0-5 0 - 5 WBC/hpf   Bacteria, UA NONE SEEN NONE SEEN   Squamous Epithelial / HPF 0-5 0 - 5 /HPF   Mucus PRESENT     Comment: Performed at [facility], [place] [address], [place], [place] [address]   DG ABD ACUTE 2+V W 1V CHEST  Result Date: 11/26/2022 CLINICAL DATA:  Vomiting and weakness. EXAM: DG ABDOMEN ACUTE WITH 1 VIEW CHEST COMPARISON:  CT of the abdomen on 11/22/2022 and chest x-ray on 07/16/2022 FINDINGS: From chest radiograph demonstrates normal heart size and mediastinal contours. There is no evidence of pulmonary edema, consolidation, pneumothorax, nodule or pleural fluid. Abdominal films demonstrate normal bowel gas pattern without evidence of obstruction, ileus or free air. No abnormal calcifications identified. Visualized bony structures are unremarkable. IMPRESSION: Negative abdominal radiographs.  No acute cardiopulmonary disease. Electronically Signed   By: Aletta Edouard M.D.   On: 11/26/2022 15:28    Pending Labs Unresulted Labs (From admission, onward)     Start     Ordered   11/26/22 [id number]  Basic metabolic panel  Once-Timed,   STAT        11/26/22 1717   11/26/22 1800  Beta-hydroxybutyric acid  Once-Timed,   URGENT        11/26/22 1718   Signed and Held  HIV Antibody (routine testing w rflx)  (HIV Antibody (Routine testing w reflex) panel)  Once,   R        Signed and Held   Signed and Held  Hemoglobin A1c  (Glycemic Control (SSI)  Q 4 Hours / Glycemic Control (SSI)  AC +/- HS)  Once,   R       Comments: To assess prior glycemic control    Signed and Held   Signed and Held  Basic metabolic panel   Now then every 6 hours,   R      Signed and Held   Signed and Held  CBC  Tomorrow morning,   R        Signed and Held            Vitals/Pain Today's Vitals   11/26/22 1418 11/26/22 1437  BP: 116/86 133/83  Pulse: 100 72  Resp: 19 16  Temp: 98.2 F (36.8 C) 98 F (36.7 C)  TempSrc: Oral   SpO2: 100% 98%  PainSc: 5      Isolation Precautions No active isolations  Medications Medications  lactated ringers infusion ( Intravenous New Bag/Given 11/26/22 1701)  lactated ringers bolus 2,000 mL (0 mLs Intravenous Stopped 11/26/22 1652)  insulin aspart (novoLOG) injection 10 Units (10 Units Subcutaneous Given 11/26/22 1533)  insulin aspart (novoLOG) injection 15 Units (15 Units Subcutaneous Given 11/26/22 1746)    Mobility walks

## 2022-11-26 NOTE — ED Triage Notes (Addendum)
Patient said he has been vomiting since Wednesday. Has type 2 diabetes and takes insulin 2x a day. Feel weak and no energy to walk.

## 2022-11-26 NOTE — Plan of Care (Signed)
Plan of care initiated.

## 2022-11-26 NOTE — ED Provider Notes (Signed)
[place] EMERGENCY DEPARTMENT AT [facility] Provider Note   CSN: [id number] Arrival date & time: 11/26/22  1410     History  Chief Complaint  Patient presents with   Hyperglycemia    Shawn Benson is a 42 y.o. male.  42 year old male presents with persistent nausea and vomiting.  Also notes epigastric discomfort.  Seen here recently for same and that visit was reviewed.  Workup was negative at that time.  Was treated with antiemetics and discharged.  States since he left the hospital several days ago he is continue to not be able to keep anything down.  Denies any fever or chills.  Emesis has been nonbilious or bloody.  States increased weakness.  Is also been hyperglycemic at home with sugars in the 300s.       Home Medications Prior to Admission medications   Medication Sig Start Date End Date Taking? Authorizing Provider  atorvastatin (LIPITOR) 20 MG tablet Take 1 tablet (20 mg total) by mouth daily. Patient not taking: Reported on 07/18/2022 08/02/21   Mack Hook, MD  amoxicillin-clavulanate (AUGMENTIN XR) 1000-62.5 MG 12 hr tablet Take 2 tablets by mouth 2 (two) times daily. Patient not taking: Reported on 11/22/2022 10/17/22   Small, Brooke L, PA  amoxicillin-clavulanate (AUGMENTIN) 875-125 MG tablet Take 1 tablet by mouth 2 (two) times daily. Patient not taking: Reported on 11/22/2022 10/17/22   Small, Brooke L, PA  Blood Glucose Monitoring Suppl (ACCU-CHEK AVIVA PLUS) w/Device KIT Check blood sugar before meals 3 times daily Patient not taking: Reported on 07/18/2022 09/16/20   Mack Hook, MD  Continuous Blood Gluc Receiver (FREESTYLE LIBRE 2 READER) DEVI Check blood glucose 4 times daily before meals and at bedtime. Patient not taking: Reported on 07/18/2022 06/28/21   Mack Hook, MD  Continuous Blood Gluc Sensor (FREESTYLE LIBRE 2 SENSOR) MISC Check Blood glucose 4 times daily before each meal and at bedtime Patient not taking:  Reported on 07/18/2022 06/28/21   Mack Hook, MD  dicyclomine (BENTYL) 20 MG tablet Take 1 tablet (20 mg total) by mouth 2 (two) times daily. 11/22/22   Evlyn Courier, PA-C  gabapentin (NEURONTIN) 300 MG capsule Take 1 capsule (300 mg total) by mouth at bedtime. Patient not taking: Reported on 07/18/2022 12/14/21   Landis Martins, DPM  insulin NPH-regular Human (NOVOLIN 70/30) (70-30) 100 UNIT/ML injection Inject 52 Units into the skin 2 (two) times daily with a meal. 52 units subcutaneously twice daily before meals Patient taking differently: Inject 70 Units into the skin 2 (two) times daily with a meal. 52 units subcutaneously twice daily before meals 07/10/16   Mack Hook, MD  Insulin Syringe-Needle U-100 (INSULIN SYRINGE .5CC/31GX5/16") 31G X 5/16" 0.5 ML MISC 1 Device by Does not apply route 2 (two) times daily with a meal. Patient not taking: Reported on 07/18/2022 10/20/16   Mack Hook, MD  ketoconazole (NIZORAL) 2 % cream Apply to both feet and between toes once daily for 6 weeks. Patient not taking: Reported on 11/22/2022 06/26/22   Marzetta Board, DPM  ondansetron (ZOFRAN-ODT) 4 MG disintegrating tablet Take 1 tablet (4 mg total) by mouth every 8 (eight) hours as needed. 11/22/22   Deatra Canter, Amjad, PA-C  terbinafine (LAMISIL) 250 MG tablet 1 tab by mouth daily for 84 days. Patient not taking: Reported on 07/18/2022 06/28/21   Mack Hook, MD      Allergies    Patient has no known allergies.    Review of Systems   Review  of Systems  All other systems reviewed and are negative.   Physical Exam Updated Vital Signs BP 133/83 (BP Location: Left Arm)   Pulse 72   Temp 98 F (36.7 C)   Resp 16   SpO2 98%  Physical Exam Vitals and nursing note reviewed.  Constitutional:      General: He is not in acute distress.    Appearance: Normal appearance. He is well-developed. He is not toxic-appearing.  HENT:     Head: Normocephalic and atraumatic.  Eyes:      General: Lids are normal.     Conjunctiva/sclera: Conjunctivae normal.     Pupils: Pupils are equal, round, and reactive to light.  Neck:     Thyroid: No thyroid mass.     Trachea: No tracheal deviation.  Cardiovascular:     Rate and Rhythm: Normal rate and regular rhythm.     Heart sounds: Normal heart sounds. No murmur heard.    No gallop.  Pulmonary:     Effort: Pulmonary effort is normal. No respiratory distress.     Breath sounds: Normal breath sounds. No stridor. No decreased breath sounds, wheezing, rhonchi or rales.  Abdominal:     General: There is no distension.     Palpations: Abdomen is soft.     Tenderness: There is abdominal tenderness in the epigastric area. There is no rebound.  Musculoskeletal:        General: No tenderness. Normal range of motion.     Cervical back: Normal range of motion and neck supple.  Skin:    General: Skin is warm and dry.     Findings: No abrasion or rash.  Neurological:     Mental Status: He is alert and oriented to person, place, and time. Mental status is at baseline.     GCS: GCS eye subscore is 4. GCS verbal subscore is 5. GCS motor subscore is 6.     Cranial Nerves: No cranial nerve deficit.     Sensory: No sensory deficit.     Motor: Motor function is intact.  Psychiatric:        Attention and Perception: Attention normal.        Speech: Speech normal.        Behavior: Behavior normal.     ED Results / Procedures / Treatments   Labs (all labs ordered are listed, but only abnormal results are displayed) Labs Reviewed  CBG MONITORING, ED - Abnormal; Notable for the following components:      Result Value   Glucose-Capillary 439 (*)    All other components within normal limits  CBG MONITORING, ED - Abnormal; Notable for the following components:   Glucose-Capillary 472 (*)    All other components within normal limits  CBC WITH DIFFERENTIAL/PLATELET  COMPREHENSIVE METABOLIC PANEL  LIPASE, BLOOD  URINALYSIS, ROUTINE W  REFLEX MICROSCOPIC  BLOOD GAS, VENOUS    EKG None  Radiology No results found.  Procedures Procedures    Medications Ordered in ED Medications  lactated ringers bolus 2,000 mL (has no administration in time range)  lactated ringers infusion (has no administration in time range)    ED Course/ Medical Decision Making/ A&P                             Medical Decision Making Amount and/or Complexity of Data Reviewed Labs: ordered. Radiology: ordered.  Risk Prescription drug management.   Patient given IV fluids here and  has persistent nausea vomit despite that.  Patient recently seen here for same and has failed outpatient therapy.  Blood sugar elevated and given insulin.. will admit to the hospital service        Final Clinical Impression(s) / ED Diagnoses Final diagnoses:  None    Rx / DC Orders ED Discharge Orders     None         Lacretia Leigh, MD 11/26/22 1605

## 2022-11-26 NOTE — H&P (Addendum)
History and Physical    Shawn Benson [id number] DOB: August 15, 1981 DOA: 11/26/2022  PCP: Mack Hook, MD Patient coming from: Home  Chief Complaint: Nausea, vomiting  HPI: Shawn P Fore is a 42 y.o. male with medical history significant of asthma, DM2, obesity who presents for hyperglycemia, nausea, vomiting and inability to keep anything down since 11/21/22.  He notes that he did not know of anything that changed during that time.  He has not been able to keep anything down.  When he does drink fluids or eat something, it will immediately come back up.  He notes a sensation of dysphagia when he eat and then a sensation of burning chest pain when he throws up.  He is vomiting up bile, yellowish fluid and previously food.  He has only taken his insulin once since this started.  He has never had this happen before.  He is not aware of any inciting event.  He notes that he has had DM2 for about 4-5 years and has been taking his insulin prior to this event.  He denies substernal chest pain, fever, chills, diarrhea, constipation, rash.  He does have pain in the legs which is aching and also sensitive to touch.  He has no swelling or any skin breakdown.   ED Course: In the ED, he was found to be hyperglycemic, with an elevated AG, Bicarb of 20.  His pH on VBG, however, was 7.45.  He denies any ingestions.  WBC was elevated at 14.1, with elevated monocytes.  Renal function is preserved. UA was not consistent with infection.  CXR did not show any acute disease.  LFTs are WNL.  Lipase is 30  Review of Systems: As per HPI otherwise all other systems reviewed and are negative.   Past Medical History:  Diagnosis Date   Allergy    Seasonal--spring through fall.   Asthma childhood and adult   triggers are allergies and URI.  Spring, summer and fall.   Diabetes mellitus without complication ([place])    Morbid obesity ([place])    Peripheral edema 04/09/2017    Past Surgical History:  Procedure  Laterality Date   INCISION AND DRAINAGE PERIRECTAL ABSCESS N/A 10/06/2015   Procedure: IRRIGATION AND DEBRIDEMENT PERIANAL  ABSCESS;  Surgeon: Alphonsa Overall, MD;  Location: [facility] ORS;  Service: General;  Laterality: N/A;   KNEE SURGERY     Right and Left Knee    Social History  reports that he has been smoking cigarettes. He has been smoking an average of 1 pack per day. He has never used smokeless tobacco. He reports current alcohol use. He reports that he does not currently use drugs.  No Known Allergies  Family History  Problem Relation Age of Onset   Diabetes Mother    Diabetes Father    Diabetes Brother    Diabetes Brother      Prior to Admission medications   Medication Sig Start Date End Date Taking? Authorizing Provider                                                          insulin NPH-regular Human (NOVOLIN 70/30) (70-30) 100 UNIT/ML injection Inject 52 Units into the skin 2 (two) times daily with a meal. 52 units subcutaneously twice daily before meals Patient taking differently: Inject 70  Units into the skin 2 (two) times daily with a meal. 52 units subcutaneously twice daily before meals 07/10/16   Mack Hook, MD                ondansetron (ZOFRAN-ODT) 4 MG disintegrating tablet Take 1 tablet (4 mg total) by mouth every 8 (eight) hours as needed. 11/22/22   Evlyn Courier, PA-C           Physical Exam: Vitals:   11/26/22 1418 11/26/22 1437 11/26/22 1820 11/26/22 1844  BP: 116/86 133/83 (!) 151/90 (!) 151/85  Pulse: 100 72 82 78  Resp: 19 16 (!) 27 18  Temp: 98.2 F (36.8 C) 98 F (36.7 C) 98 F (36.7 C) 99.6 F (37.6 C)  TempSrc: Oral  Oral Oral  SpO2: 100% 98% 96% 98%    Constitutional: NAD, calm, comfortable, lying in bed Eyes: Injected conjunctivae bilaterally, no icterus ENMT: Mucous membranes are very dry Neck: normal, supple Respiratory: CTAB, no wheezing Cardiovascular: RR, NR, no murmur noted, no pedal edema Abdomen: + TTP in the  epigastrium, otherwise non distended, +BS Musculoskeletal: normal tone and bulk, no swelling or rashes on LE, he has some tenderness to palpation generalized to the lower legs below the knees Skin: Some chronic skin changes noted Neurologic: non focal, grossly intact Psychiatric: Normal judgment and insight.    Labs on Admission: I have personally reviewed following labs and imaging studies  CBC: Recent Labs  Lab 11/21/22 2350 11/26/22 1455  WBC 13.8* 14.1*  NEUTROABS 12.2* 10.2*  HGB 15.1 16.8  HCT 47.9 51.0  MCV 82.6 79.3*  PLT 263 [id number]    Basic Metabolic Panel: Recent Labs  Lab 11/21/22 2350 11/26/22 1455 11/26/22 1755  NA 136 133* 134*  K 3.6 3.2* 3.0*  CL 101 96* 98  CO2 22 20* 23  GLUCOSE 338* 423* 316*  BUN 14 16 15  $ CREATININE 0.69 0.89 0.72  CALCIUM 9.3 8.4* 8.6*    GFR: CrCl cannot be calculated (Unknown ideal weight.).  Liver Function Tests: Recent Labs  Lab 11/21/22 2350 11/26/22 1455  AST 17 10*  ALT 12 12  ALKPHOS 101 84  BILITOT 0.7 2.0*  PROT 8.0 6.8  ALBUMIN 4.2 3.4*    Urine analysis:    Component Value Date/Time   COLORURINE STRAW (A) 11/26/2022 1701   APPEARANCEUR CLEAR 11/26/2022 1701   LABSPEC 1.016 11/26/2022 1701   PHURINE 5.0 11/26/2022 1701   GLUCOSEU >=500 (A) 11/26/2022 1701   HGBUR NEGATIVE 11/26/2022 1701   BILIRUBINUR NEGATIVE 11/26/2022 1701   BILIRUBINUR neg 07/22/2017 1114   KETONESUR 80 (A) 11/26/2022 1701   PROTEINUR NEGATIVE 11/26/2022 1701   UROBILINOGEN 0.2 07/22/2017 1114   NITRITE NEGATIVE 11/26/2022 1701   LEUKOCYTESUR NEGATIVE 11/26/2022 1701    Radiological Exams on Admission: DG ABD ACUTE 2+V W 1V CHEST  Result Date: 11/26/2022 CLINICAL DATA:  Vomiting and weakness. EXAM: DG ABDOMEN ACUTE WITH 1 VIEW CHEST COMPARISON:  CT of the abdomen on 11/22/2022 and chest x-ray on 07/16/2022 FINDINGS: From chest radiograph demonstrates normal heart size and mediastinal contours. There is no evidence of  pulmonary edema, consolidation, pneumothorax, nodule or pleural fluid. Abdominal films demonstrate normal bowel gas pattern without evidence of obstruction, ileus or free air. No abnormal calcifications identified. Visualized bony structures are unremarkable. IMPRESSION: Negative abdominal radiographs.  No acute cardiopulmonary disease. Electronically Signed   By: Aletta Edouard M.D.   On: 11/26/2022 15:28    EKG: ordered  Assessment/Plan  High anion  gap metabolic acidosis with concomitant metabolic alkalosis Hyperglycemia Hypokalemia DM2 on insulin - I think his derangements can be explained by hyperglycemia and early AG metabolic acidosis and vomiting causing an alkalosis - Treated with SQ insulin, 0.2units/kg, and now around 0.1units/kg by SSI - Trend BMP q 6 hours - SQ insulin through SSI every 4 hours - Treat nausea with zofran - Check EKG - Replete K in IVF and with IV replacement UPDATE: K is lower on recheck at 2.8, will also trial 57mq of oral K powder in a small amount of liquid, if he can tolerate.  EKG pending - Restart home insulin, 70 units BID, when eating regularly - NPO - Lipase and LFTs are normal.  - Patient is requesting a new Rx for his CGM on discharge.   Nausea/vomiting Dehydration - LR with K at 125cc/hr - Received a 2L bolus in the ED - Monitor for improved volume status - Given his neuropathic symptoms - started gabapentin, also could be having gastric emptying issues. - Gastric emptying study  Leukocytosis - Likely reactive in the setting of vomiting - Monitor for fever - UA reassuring - CXR without acute issue - [facility]    DVT prophylaxis: Xarelto  Code Status:   Full  Disposition Plan:   Patient is from:  Home  Anticipated DC to:  Home  Anticipated DC date:  11/28/22  Anticipated DC barriers: Improvement in symptoms, ability to eat  Consults called:  None  Admission status:  IP, med surg   Severity  of Illness: The appropriate patient status for this patient is INPATIENT. Inpatient status is judged to be reasonable and necessary in order to provide the required intensity of service to ensure the patient's safety. The patient's presenting symptoms, physical exam findings, and initial radiographic and laboratory data in the context of their chronic comorbidities is felt to place them at high risk for further clinical deterioration. Furthermore, it is not anticipated that the patient will be medically stable for discharge from the hospital within 2 midnights of admission.   * I certify that at the point of admission it is my clinical judgment that the patient will require inpatient hospital care spanning beyond 2 midnights from the point of admission due to high intensity of service, high risk for further deterioration and high frequency of surveillance required.*Gilles ChiquitoMD [facility]  How to contact the [facility] CenterAttending or Consulting provider 7Thermopolisor covering provider during after hours [place] for this patient?   Check the care team in [facility]and look for a) attending/consulting [facility] provider listed and b) the TM[facility] Centerteam listed Log into [email or web address] and use [place]'s universal password to access. If you do not have the password, please contact the hospital operator. Locate the TJefferson Hospitalprovider you are looking for under [facility] and page to a number that you can be directly reached. If you still have difficulty reaching the provider, please page the [facility](Director on Call) for the Hospitalists listed on amion for assistance.  11/26/2022, 6:50 PM

## 2022-11-27 ENCOUNTER — Other Ambulatory Visit: Payer: Self-pay

## 2022-11-27 ENCOUNTER — Inpatient Hospital Stay (HOSPITAL_COMMUNITY): Payer: BC Managed Care – PPO

## 2022-11-27 DIAGNOSIS — E8729 Other acidosis: Secondary | ICD-10-CM | POA: Diagnosis not present

## 2022-11-27 DIAGNOSIS — R1310 Dysphagia, unspecified: Secondary | ICD-10-CM

## 2022-11-27 DIAGNOSIS — R112 Nausea with vomiting, unspecified: Secondary | ICD-10-CM | POA: Insufficient documentation

## 2022-11-27 LAB — BASIC METABOLIC PANEL
Anion gap: 10 (ref 5–15)
Anion gap: 12 (ref 5–15)
Anion gap: 12 (ref 5–15)
Anion gap: 13 (ref 5–15)
BUN: 13 mg/dL (ref 6–20)
BUN: 13 mg/dL (ref 6–20)
BUN: 14 mg/dL (ref 6–20)
BUN: 14 mg/dL (ref 6–20)
CO2: 21 mmol/L — ABNORMAL LOW (ref 22–32)
CO2: 24 mmol/L (ref 22–32)
CO2: 25 mmol/L (ref 22–32)
CO2: 27 mmol/L (ref 22–32)
Calcium: 8.5 mg/dL — ABNORMAL LOW (ref 8.9–10.3)
Calcium: 8.6 mg/dL — ABNORMAL LOW (ref 8.9–10.3)
Calcium: 8.7 mg/dL — ABNORMAL LOW (ref 8.9–10.3)
Calcium: 8.7 mg/dL — ABNORMAL LOW (ref 8.9–10.3)
Chloride: 100 mmol/L (ref 98–111)
Chloride: 102 mmol/L (ref 98–111)
Chloride: 97 mmol/L — ABNORMAL LOW (ref 98–111)
Chloride: 99 mmol/L (ref 98–111)
Creatinine, Ser: 0.69 mg/dL (ref 0.61–1.24)
Creatinine, Ser: 0.85 mg/dL (ref 0.61–1.24)
Creatinine, Ser: 0.92 mg/dL (ref 0.61–1.24)
Creatinine, Ser: 0.92 mg/dL (ref 0.61–1.24)
GFR, Estimated: >60 mL/min (ref >60)
GFR, Estimated: >60 mL/min (ref >60)
GFR, Estimated: >60 mL/min (ref >60)
GFR, Estimated: >60 mL/min (ref >60)
Glucose, Bld: 175 mg/dL — ABNORMAL HIGH (ref 70–99)
Glucose, Bld: 225 mg/dL — ABNORMAL HIGH (ref 70–99)
Glucose, Bld: 257 mg/dL — ABNORMAL HIGH (ref 70–99)
Glucose, Bld: 336 mg/dL — ABNORMAL HIGH (ref 70–99)
Potassium: 3.1 mmol/L — ABNORMAL LOW (ref 3.5–5.1)
Potassium: 3.2 mmol/L — ABNORMAL LOW (ref 3.5–5.1)
Potassium: 3.3 mmol/L — ABNORMAL LOW (ref 3.5–5.1)
Potassium: 3.6 mmol/L (ref 3.5–5.1)
Sodium: 134 mmol/L — ABNORMAL LOW (ref 135–145)
Sodium: 135 mmol/L (ref 135–145)
Sodium: 136 mmol/L (ref 135–145)
Sodium: 137 mmol/L (ref 135–145)

## 2022-11-27 LAB — CBC
HCT: 48 % (ref 39.0–52.0)
Hemoglobin: 15.9 g/dL (ref 13.0–17.0)
MCH: 26.2 pg (ref 26.0–34.0)
MCHC: 33.1 g/dL (ref 30.0–36.0)
MCV: 79.2 fL — ABNORMAL LOW (ref 80.0–100.0)
Platelets: 288 10*3/uL (ref 150–400)
RBC: 6.06 MIL/uL — ABNORMAL HIGH (ref 4.22–5.81)
RDW: 13.3 % (ref 11.5–15.5)
WBC: 13.4 10*3/uL — ABNORMAL HIGH (ref 4.0–10.5)
nRBC: 0 % (ref 0.0–0.2)

## 2022-11-27 LAB — GLUCOSE, CAPILLARY
Glucose-Capillary: 179 mg/dL — ABNORMAL HIGH (ref 70–99)
Glucose-Capillary: 215 mg/dL — ABNORMAL HIGH (ref 70–99)
Glucose-Capillary: 239 mg/dL — ABNORMAL HIGH (ref 70–99)
Glucose-Capillary: 299 mg/dL — ABNORMAL HIGH (ref 70–99)
Glucose-Capillary: 352 mg/dL — ABNORMAL HIGH (ref 70–99)

## 2022-11-27 LAB — HIV ANTIBODY (ROUTINE TESTING W REFLEX): HIV Screen 4th Generation wRfx: NONREACTIVE

## 2022-11-27 MED ORDER — PANTOPRAZOLE SODIUM 40 MG IV SOLR
40.0000 mg | Freq: Two times a day (BID) | INTRAVENOUS | Status: DC
  Administered 2022-11-27 – 2022-11-29 (×6): 40 mg via INTRAVENOUS
  Filled 2022-11-27 (×6): qty 10

## 2022-11-27 MED ORDER — MORPHINE SULFATE (PF) 2 MG/ML IV SOLN
2.0000 mg | INTRAVENOUS | Status: DC | PRN
  Administered 2022-11-27 – 2022-11-29 (×5): 2 mg via INTRAVENOUS
  Filled 2022-11-27 (×6): qty 1

## 2022-11-27 MED ORDER — TECHNETIUM TC 99M SULFUR COLLOID
1.9000 | Freq: Once | INTRAVENOUS | Status: AC | PRN
  Administered 2022-11-27: 1.9 via INTRAVENOUS

## 2022-11-27 MED ORDER — INSULIN ASPART PROT & ASPART (70-30 MIX) 100 UNIT/ML ~~LOC~~ SUSP
20.0000 [IU] | Freq: Two times a day (BID) | SUBCUTANEOUS | Status: DC
  Administered 2022-11-27: 20 [IU] via SUBCUTANEOUS
  Filled 2022-11-27: qty 10

## 2022-11-27 MED ORDER — POTASSIUM CHLORIDE CRYS ER 20 MEQ PO TBCR
40.0000 meq | EXTENDED_RELEASE_TABLET | ORAL | Status: DC
  Filled 2022-11-27: qty 2

## 2022-11-27 MED ORDER — POTASSIUM CHLORIDE CRYS ER 20 MEQ PO TBCR
40.0000 meq | EXTENDED_RELEASE_TABLET | ORAL | Status: AC
  Administered 2022-11-27 (×2): 40 meq via ORAL
  Filled 2022-11-27 (×2): qty 2

## 2022-11-27 NOTE — Plan of Care (Signed)
  Problem: Activity: Goal: Risk for activity intolerance will decrease Outcome: Progressing   Problem: Nutrition: Goal: Adequate nutrition will be maintained Outcome: Progressing   Problem: Pain Managment: Goal: General experience of comfort will improve Outcome: Progressing   Problem: Safety: Goal: Ability to remain free from injury will improve Outcome: Progressing   

## 2022-11-27 NOTE — Progress Notes (Signed)
PROGRESS NOTE    Shawn Benson  [id number] DOB: [birth year] DOA: 11/26/2022 PCP: Mack Hook, MD   Brief Narrative:  HPI: Shawn P Deoliveira is a 42 y.o. male with medical history significant of asthma, DM2, obesity who presents for hyperglycemia, nausea, vomiting and inability to keep anything down since 11/21/22.  He notes that he did not know of anything that changed during that time.  He has not been able to keep anything down.  When he does drink fluids or eat something, it will immediately come back up.  He notes a sensation of dysphagia when he eat and then a sensation of burning chest pain when he throws up.  He is vomiting up bile, yellowish fluid and previously food.  He has only taken his insulin once since this started.  He has never had this happen before.  He is not aware of any inciting event.  He notes that he has had DM2 for about 4-5 years and has been taking his insulin prior to this event.  He denies substernal chest pain, fever, chills, diarrhea, constipation, rash.  He does have pain in the legs which is aching and also sensitive to touch.  He has no swelling or any skin breakdown.    ED Course: In the ED, he was found to be hyperglycemic, with an elevated AG, Bicarb of 20.  His pH on VBG, however, was 7.45.  He denies any ingestions.  WBC was elevated at 14.1, with elevated monocytes.  Renal function is preserved. UA was not consistent with infection.  CXR did not show any acute disease.  LFTs are WNL.  Lipase is 30  Assessment & Plan:   Principal Problem:   High anion gap metabolic acidosis Active Problems:   Type 2 diabetes mellitus without complication, with long-term current use of insulin (HCC)   Morbid obesity due to excess calories (HCC)   Sleep apnea   Dysphagia   Nausea and vomiting  High anion gap metabolic acidosis/type 2 diabetes mellitus: This was metabolic condition, his acidosis has resolved.  Anion gap is closed as well.  His blood sugar is  improving but still is elevated.  He appears to be taking 70/30, 60 units twice daily, I will start him on 20 units twice daily for now and will continue SSI.  Will also continue Ringer's lactate that he is on since his p.o. intake is unreliable.  Nausea/vomiting/dysphagia/dehydration: Patient complains of burning pain in the esophageal area and he has been having this for last 1 week.  He says that he is also having difficulty swallowing, equally with liquids and solids.  Patient is undergoing gastric emptying study, once that is done, we will also perform barium swallow.  Continue symptomatic treatment.  He is willing to try liquids so I will start him on clear liquid diet.  Will also start him on Protonix for possible GERD as he claims that he drinks at least 3 cups of coffee and at least 3 large sodas a day.  Also eats spicy and junk food.  Hypokalemia: Will replace.  Hyponatremia: Resolved.  Obesity: Weight loss and dietary modification counseled.  History of sleep apnea: Unsure whether he uses CPAP at night or not.  Leukocytosis: Nonspecific and improving.  He is afebrile.  No signs of infection.  Hyperlipidemia: Takes atorvastatin at home which I am going to hold for now since he is symptomatic with nausea.  DVT prophylaxis: rivaroxaban (XARELTO) tablet 10 mg Start: 11/26/22 2200   Code  Status: Full Code  Family Communication:  None present at bedside.  Plan of care discussed with patient in length and he/she verbalized understanding and agreed with it.  Status is: Inpatient Remains inpatient appropriate because: Still symptomatic with nausea and heartburn.   Estimated body mass index is 38.3 kg/m as calculated from the following:   Height as of this encounter: 6' 7"$  (2.007 m).   Weight as of this encounter: 154.2 kg.    Nutritional Assessment: Body mass index is 38.3 kg/m.Marland Kitchen Seen by dietician.  I agree with the assessment and plan as outlined below: Nutrition Status:         . Skin Assessment: I have examined the patient's skin and I agree with the wound assessment as performed by the wound care RN as outlined below:    Consultants:  None  Procedures:  None  Antimicrobials:  Anti-infectives (From admission, onward)    None         Subjective: Patient seen and examined while he was having his gastric emptying study.  He was complaining of pain in the esophageal area/epigastric and heartburn.  Objective: Vitals:   11/26/22 1850 11/26/22 2033 11/27/22 0034 11/27/22 0441  BP: (!) 164/87 131/78 (!) 130/58 132/67  Pulse: 82 70 76 73  Resp: 18 18 17 17  $ Temp: 99.5 F (37.5 C) 98.6 F (37 C) 98.9 F (37.2 C) 98.6 F (37 C)  TempSrc: Oral Oral Oral Oral  SpO2: 95% 100% 96% 98%  Weight:   (!) 154.2 kg   Height:   6' 7"$  (2.007 m)     Intake/Output Summary (Last 24 hours) at 11/27/2022 1125 Last data filed at 11/27/2022 0600 Gross per 24 hour  Intake 774.82 ml  Output 450 ml  Net 324.82 ml   Filed Weights   11/27/22 0034  Weight: (!) 154.2 kg    Examination:  General exam: Appears calm and comfortable, obese Respiratory system: Clear to auscultation. Respiratory effort normal. Cardiovascular system: S1 & S2 heard, RRR. No JVD, murmurs, rubs, gallops or clicks. No pedal edema. Gastrointestinal system: Abdomen is nondistended, soft and epigastric tenderness. No organomegaly or masses felt. Normal bowel sounds heard. Central nervous system: Alert and oriented. No focal neurological deficits. Extremities: Symmetric 5 x 5 power. Skin: No rashes, lesions or ulcers Psychiatry: Judgement and insight appear normal. Mood & affect appropriate.    Data Reviewed: I have personally reviewed following labs and imaging studies  CBC: Recent Labs  Lab 11/21/22 2350 11/26/22 1455 11/27/22 0344  WBC 13.8* 14.1* 13.4*  NEUTROABS 12.2* 10.2*  --   HGB 15.1 16.8 15.9  HCT 47.9 51.0 48.0  MCV 82.6 79.3* 79.2*  PLT 263 292 [id number]   Basic  Metabolic Panel: Recent Labs  Lab 11/26/22 1455 11/26/22 1755 11/26/22 1857 11/27/22 0017 11/27/22 0726  NA 133* 134* 133* 134* 136  K 3.2* 3.0* 2.8* 3.1* 3.2*  CL 96* 98 97* 100 97*  CO2 20* 23 21* 24 27  GLUCOSE 423* 316* 272* 175* 225*  BUN 16 15 14 13 13  $ CREATININE 0.89 0.72 0.75 0.69 0.92  CALCIUM 8.4* 8.6* 8.5* 8.7* 8.6*   GFR: Estimated Creatinine Clearance: 172.6 mL/min (by C-G formula based on SCr of 0.92 mg/dL). Liver Function Tests: Recent Labs  Lab 11/21/22 2350 11/26/22 1455  AST 17 10*  ALT 12 12  ALKPHOS 101 84  BILITOT 0.7 2.0*  PROT 8.0 6.8  ALBUMIN 4.2 3.4*   Recent Labs  Lab 11/21/22 2350 11/26/22 1455  LIPASE 27 30   No results for input(s): "AMMONIA" in the last 168 hours. Coagulation Profile: No results for input(s): "INR", "PROTIME" in the last 168 hours. Cardiac Enzymes: No results for input(s): "CKTOTAL", "CKMB", "CKMBINDEX", "TROPONINI" in the last 168 hours. BNP (last 3 results) No results for input(s): "PROBNP" in the last 8760 hours. HbA1C: Recent Labs    11/26/22 1857  HGBA1C 12.9*   CBG: Recent Labs  Lab 11/26/22 1435 11/26/22 1635 11/26/22 2031 11/27/22 0034 11/27/22 0442  GLUCAP 472* 350* 276* 179* 215*   Lipid Profile: No results for input(s): "CHOL", "HDL", "LDLCALC", "TRIG", "CHOLHDL", "LDLDIRECT" in the last 72 hours. Thyroid Function Tests: No results for input(s): "TSH", "T4TOTAL", "FREET4", "T3FREE", "THYROIDAB" in the last 72 hours. Anemia Panel: No results for input(s): "VITAMINB12", "FOLATE", "FERRITIN", "TIBC", "IRON", "RETICCTPCT" in the last 72 hours. Sepsis Labs: No results for input(s): "PROCALCITON", "LATICACIDVEN" in the last 168 hours.  No results found for this or any previous visit (from the past 240 hour(s)).   Radiology Studies: DG ABD ACUTE 2+V W 1V CHEST  Result Date: 11/26/2022 CLINICAL DATA:  Vomiting and weakness. EXAM: DG ABDOMEN ACUTE WITH 1 VIEW CHEST COMPARISON:  CT of the  abdomen on 11/22/2022 and chest x-ray on 07/16/2022 FINDINGS: From chest radiograph demonstrates normal heart size and mediastinal contours. There is no evidence of pulmonary edema, consolidation, pneumothorax, nodule or pleural fluid. Abdominal films demonstrate normal bowel gas pattern without evidence of obstruction, ileus or free air. No abnormal calcifications identified. Visualized bony structures are unremarkable. IMPRESSION: Negative abdominal radiographs.  No acute cardiopulmonary disease. Electronically Signed   By: Aletta Edouard M.D.   On: 11/26/2022 15:28    Scheduled Meds:  gabapentin  300 mg Oral QHS   insulin aspart  0-20 Units Subcutaneous Q4H   insulin aspart protamine- aspart  20 Units Subcutaneous BID WC   pantoprazole (PROTONIX) IV  40 mg Intravenous Q12H   potassium chloride  40 mEq Oral Q4H   rivaroxaban  10 mg Oral QHS   sodium chloride flush  3 mL Intravenous Q12H   Continuous Infusions:  lactated ringers 1,000 mL with potassium chloride 20 mEq infusion 125 mL/hr at 11/26/22 2023     LOS: 1 day   Darliss Cheney, MD [facility]  11/27/2022, 11:25 AM   *Please note that this is a verbal dictation therefore any spelling or grammatical errors are due to the "[place] [employer]" system interpretation.  Please page via [place] and do not message via secure chat for urgent patient care matters. Secure chat can be used for non urgent patient care matters.  How to contact the [facility] Attending or Consulting provider [place] or covering provider during after hours [place], for this patient?  Check the care team in [facility] and look for a) attending/consulting TRH provider listed and b) the [facility] team listed. Page or secure chat [phone]. Log into [email or web address] and use [place]'s universal password to access. If you do not have the password, please contact the hospital operator. Locate the [facility] provider you are looking for under [facility] and page to a number that you can be  directly reached. If you still have difficulty reaching the provider, please page the [facility] (Director on Call) for the Hospitalists listed on amion for assistance.

## 2022-11-27 NOTE — Inpatient Diabetes Management (Signed)
Inpatient Diabetes Program Recommendations  AACE/ADA: New Consensus Statement on Inpatient Glycemic Control (2015)  Target Ranges:  Prepandial:   less than 140 mg/dL      Peak postprandial:   less than 180 mg/dL (1-2 hours)      Critically ill patients:  140 - 180 mg/dL   Lab Results  Component Value Date   GLUCAP 299 (H) 11/27/2022   HGBA1C 12.9 (H) 11/26/2022    Review of Glycemic Control  Diabetes history: DM2 Outpatient Diabetes medications: Novolin 70/30 70 units BID Current orders for Inpatient glycemic control: Novolog 20 units BID  HgbA1C - 12.9% Pt continues to be nauseated. On liquid diet, taking very little po's  Inpatient Diabetes Program Recommendations:   May need basal/bolus insulin while poor po intake.   Consider Semglee 30 units BID  Continue Novolog 0-20 Q4H  Will need Novolog meal coverage (10 units TID) when eating > 50% meal.  Spoke with patient about new diabetes diagnosis.  Discussed A1C results (12.9%) and explained what an A1C is and informed patient that his current A1C indicates an average glucose of 324 mg/dl over the past 2-3 months. Reviewed basic pathophysiology of DM Type 2, basic home care, importance of checking CBGs and maintaining good CBG control to prevent long-term and short-term complications. Reviewed glucose and A1C goals.  Discussed impact of nutrition, exercise, stress, sickness, and medications on diabetes. Pt states he's been taking 70/30 70 units QAM, not BID. Has not been checking blood sugars at home. States he's just been lazy. Has insulin and several meters available. Pt states he stepped on a nail at work in January, and blood sugars seemed to be high after this happened. States foot "is fine now" and didn't mention to  Hospitalist. ? Whether this may be component in uncontrolled blood sugars.   Will f/u with pt and wife on 2/21. Discussed above with RN, Tu.   Thank you. Lorenda Peck, RD, LDN, [place] Inpatient Diabetes  Coordinator [phone]

## 2022-11-27 NOTE — TOC CM/SW Note (Signed)
  Transition of Care (TOC) Screening Note   Patient Details  Name: Shawn Benson Date of Birth: 1981-03-08   Transition of Care [facility]) CM/SW Contact:    Lennart Pall, LCSW Phone Number: 11/27/2022, 2:27 PM    Transition of Care Department [facility]) has reviewed patient and no TOC needs have been identified at this time. We will continue to monitor patient advancement through interdisciplinary progression rounds. If new patient transition needs arise, please place a TOC consult.

## 2022-11-27 NOTE — Progress Notes (Signed)
Transferred patient to room [id number] per policy.  Call bell within reach.  Receiving nurse at bedside.

## 2022-11-28 ENCOUNTER — Inpatient Hospital Stay (HOSPITAL_COMMUNITY): Payer: BC Managed Care – PPO

## 2022-11-28 ENCOUNTER — Ambulatory Visit: Payer: BC Managed Care – PPO | Admitting: Internal Medicine

## 2022-11-28 DIAGNOSIS — E8729 Other acidosis: Secondary | ICD-10-CM | POA: Diagnosis not present

## 2022-11-28 DIAGNOSIS — E1143 Type 2 diabetes mellitus with diabetic autonomic (poly)neuropathy: Secondary | ICD-10-CM

## 2022-11-28 LAB — CBC WITH DIFFERENTIAL/PLATELET
Abs Immature Granulocytes: 0.03 10*3/uL (ref 0.00–0.07)
Basophils Absolute: 0 10*3/uL (ref 0.0–0.1)
Basophils Relative: 0 %
Eosinophils Absolute: 0.3 10*3/uL (ref 0.0–0.5)
Eosinophils Relative: 2 %
HCT: 46.4 % (ref 39.0–52.0)
Hemoglobin: 15 g/dL (ref 13.0–17.0)
Immature Granulocytes: 0 %
Lymphocytes Relative: 26 %
Lymphs Abs: 2.7 10*3/uL (ref 0.7–4.0)
MCH: 26 pg (ref 26.0–34.0)
MCHC: 32.3 g/dL (ref 30.0–36.0)
MCV: 80.4 fL (ref 80.0–100.0)
Monocytes Absolute: 0.9 10*3/uL (ref 0.1–1.0)
Monocytes Relative: 8 %
Neutro Abs: 6.6 10*3/uL (ref 1.7–7.7)
Neutrophils Relative %: 64 %
Platelets: 297 10*3/uL (ref 150–400)
RBC: 5.77 MIL/uL (ref 4.22–5.81)
RDW: 13.8 % (ref 11.5–15.5)
WBC: 10.4 10*3/uL (ref 4.0–10.5)
nRBC: 0 % (ref 0.0–0.2)

## 2022-11-28 LAB — BASIC METABOLIC PANEL
Anion gap: 12 (ref 5–15)
BUN: 11 mg/dL (ref 6–20)
CO2: 22 mmol/L (ref 22–32)
Calcium: 8.7 mg/dL — ABNORMAL LOW (ref 8.9–10.3)
Chloride: 104 mmol/L (ref 98–111)
Creatinine, Ser: 0.68 mg/dL (ref 0.61–1.24)
GFR, Estimated: >60 mL/min (ref >60)
Glucose, Bld: 244 mg/dL — ABNORMAL HIGH (ref 70–99)
Potassium: 3.1 mmol/L — ABNORMAL LOW (ref 3.5–5.1)
Sodium: 138 mmol/L (ref 135–145)

## 2022-11-28 LAB — GLUCOSE, CAPILLARY
Glucose-Capillary: 115 mg/dL — ABNORMAL HIGH (ref 70–99)
Glucose-Capillary: 201 mg/dL — ABNORMAL HIGH (ref 70–99)
Glucose-Capillary: 228 mg/dL — ABNORMAL HIGH (ref 70–99)
Glucose-Capillary: 261 mg/dL — ABNORMAL HIGH (ref 70–99)
Glucose-Capillary: 264 mg/dL — ABNORMAL HIGH (ref 70–99)
Glucose-Capillary: 270 mg/dL — ABNORMAL HIGH (ref 70–99)

## 2022-11-28 MED ORDER — METOCLOPRAMIDE HCL 5 MG/ML IJ SOLN
5.0000 mg | Freq: Three times a day (TID) | INTRAMUSCULAR | Status: DC
  Administered 2022-11-28 – 2022-11-29 (×3): 5 mg via INTRAVENOUS
  Filled 2022-11-28 (×3): qty 2

## 2022-11-28 MED ORDER — INSULIN ASPART PROT & ASPART (70-30 MIX) 100 UNIT/ML ~~LOC~~ SUSP
35.0000 [IU] | Freq: Two times a day (BID) | SUBCUTANEOUS | Status: DC
  Administered 2022-11-28 – 2022-11-29 (×2): 35 [IU] via SUBCUTANEOUS
  Filled 2022-11-28 (×2): qty 10

## 2022-11-28 MED ORDER — POTASSIUM CHLORIDE CRYS ER 20 MEQ PO TBCR
40.0000 meq | EXTENDED_RELEASE_TABLET | ORAL | Status: AC
  Administered 2022-11-28 (×2): 40 meq via ORAL
  Filled 2022-11-28 (×3): qty 2

## 2022-11-28 NOTE — Progress Notes (Signed)
PROGRESS NOTE    Shawn Benson  [id number] DOB: 07-10-1981 DOA: 11/26/2022 PCP: Mack Hook, MD   Brief Narrative:  HPI: Shawn P Behrens is a 42 y.o. male with medical history significant of asthma, DM2, obesity who presents for hyperglycemia, nausea, vomiting and inability to keep anything down since 11/21/22.  He notes that he did not know of anything that changed during that time.  He has not been able to keep anything down.  When he does drink fluids or eat something, it will immediately come back up.  He notes a sensation of dysphagia when he eat and then a sensation of burning chest pain when he throws up.  He is vomiting up bile, yellowish fluid and previously food.  He has only taken his insulin once since this started.  He has never had this happen before.  He is not aware of any inciting event.  He notes that he has had DM2 for about 4-5 years and has been taking his insulin prior to this event.  He denies substernal chest pain, fever, chills, diarrhea, constipation, rash.  He does have pain in the legs which is aching and also sensitive to touch.  He has no swelling or any skin breakdown.    ED Course: In the ED, he was found to be hyperglycemic, with an elevated AG, Bicarb of 20.  His pH on VBG, however, was 7.45.  He denies any ingestions.  WBC was elevated at 14.1, with elevated monocytes.  Renal function is preserved. UA was not consistent with infection.  CXR did not show any acute disease.  LFTs are WNL.  Lipase is 30  Assessment & Plan:   Principal Problem:   High anion gap metabolic acidosis Active Problems:   Type 2 diabetes mellitus without complication, with long-term current use of insulin (HCC)   Morbid obesity due to excess calories (HCC)   Sleep apnea   Dysphagia   Nausea and vomiting   Diabetic gastroparesis (HCC)  High anion gap metabolic acidosis/type 2 diabetes mellitus: his acidosis has resolved.  Anion gap is closed as well. He appears to be  taking 70/30, 60 units twice daily, I started him on 20 units twice daily but he is still hyperglycemic so I will increase this to 25 units twice daily.  Continue SSI.   Nausea/vomiting/dysphagia/dehydration/diabetic gastroparesis: Patient underwent gastric emptying study yesterday which shows mild gastroparesis.  Patient is feeling slightly better.  He is tolerating liquid diet and wants to advance diet.  I will advance him to full liquid now and then soft later if he tolerates full liquid diet.  I will start him on Reglan 5 mg 3 times daily before meals.  Barium swallow is unremarkable.  Hypokalemia: Low again, Will replace.  Hyponatremia: Resolved.  Obesity: Weight loss and dietary modification counseled.  History of sleep apnea: Unsure whether he uses CPAP at night or not.  Leukocytosis: Nonspecific and improving.  He is afebrile.  No signs of infection.  Hyperlipidemia: Takes atorvastatin at home which I am going to hold for now since he is symptomatic with nausea.  DVT prophylaxis: rivaroxaban (XARELTO) tablet 10 mg Start: 11/26/22 2200   Code Status: Full Code  Family Communication:  None present at bedside.  Plan of care discussed with patient in length and he/she verbalized understanding and agreed with it.  Status is: Inpatient Remains inpatient appropriate because: Still symptomatic with nausea and heartburn.   Estimated body mass index is 38.3 kg/m as calculated from  the following:   Height as of this encounter: 6' 7"$  (2.007 m).   Weight as of this encounter: 154.2 kg.    Nutritional Assessment: Body mass index is 38.3 kg/m.Marland Kitchen Seen by dietician.  I agree with the assessment and plan as outlined below: Nutrition Status:        . Skin Assessment: I have examined the patient's skin and I agree with the wound assessment as performed by the wound care RN as outlined below:    Consultants:  None  Procedures:  None  Antimicrobials:  Anti-infectives (From  admission, onward)    None         Subjective: Patient seen and examined a couple of family members at the bedside.  Patient states that he still has symptoms of nausea and vomiting and mild abdominal pain but he is improving.  Objective: Vitals:   11/27/22 2021 11/27/22 2110 11/28/22 0143 11/28/22 0510  BP: (!) 144/85 135/79 132/77 139/81  Pulse: 76 81 81 80  Resp: 17 18 18 18  $ Temp: 99.3 F (37.4 C) 98.9 F (37.2 C) 98.1 F (36.7 C) 98.2 F (36.8 C)  TempSrc: Oral Oral Oral Oral  SpO2: 98% 99% 98% 100%  Weight:      Height:        Intake/Output Summary (Last 24 hours) at 11/28/2022 0954 Last data filed at 11/28/2022 0940 Gross per 24 hour  Intake 1724.56 ml  Output --  Net 1724.56 ml   Filed Weights   11/27/22 0034  Weight: (!) 154.2 kg    Examination:  General exam: Appears calm and comfortable  Respiratory system: Clear to auscultation. Respiratory effort normal. Cardiovascular system: S1 & S2 heard, RRR. No JVD, murmurs, rubs, gallops or clicks. No pedal edema. Gastrointestinal system: Abdomen is nondistended, soft and very mild epigastric tenderness, much improved compared to yesterday. No organomegaly or masses felt. Normal bowel sounds heard. Central nervous system: Alert and oriented. No focal neurological deficits. Extremities: Symmetric 5 x 5 power. Skin: No rashes, lesions or ulcers.  Psychiatry: Judgement and insight appear normal. Mood & affect appropriate.   Data Reviewed: I have personally reviewed following labs and imaging studies  CBC: Recent Labs  Lab 11/21/22 2350 11/26/22 1455 11/27/22 0344 11/28/22 0624  WBC 13.8* 14.1* 13.4* 10.4  NEUTROABS 12.2* 10.2*  --  6.6  HGB 15.1 16.8 15.9 15.0  HCT 47.9 51.0 48.0 46.4  MCV 82.6 79.3* 79.2* 80.4  PLT 263 292 288 [id number]   Basic Metabolic Panel: Recent Labs  Lab 11/27/22 0017 11/27/22 0726 11/27/22 1618 11/27/22 2125 11/28/22 0624  NA 134* 136 137 135 138  K 3.1* 3.2* 3.6 3.3* 3.1*   CL 100 97* 99 102 104  CO2 24 27 25 $ 21* 22  GLUCOSE 175* 225* 336* 257* 244*  BUN 13 13 14 14 11  $ CREATININE 0.69 0.92 0.92 0.85 0.68  CALCIUM 8.7* 8.6* 8.5* 8.7* 8.7*   GFR: Estimated Creatinine Clearance: 198.5 mL/min (by C-G formula based on SCr of 0.68 mg/dL). Liver Function Tests: Recent Labs  Lab 11/21/22 2350 11/26/22 1455  AST 17 10*  ALT 12 12  ALKPHOS 101 84  BILITOT 0.7 2.0*  PROT 8.0 6.8  ALBUMIN 4.2 3.4*   Recent Labs  Lab 11/21/22 2350 11/26/22 1455  LIPASE 27 30   No results for input(s): "AMMONIA" in the last 168 hours. Coagulation Profile: No results for input(s): "INR", "PROTIME" in the last 168 hours. Cardiac Enzymes: No results for input(s): "CKTOTAL", "CKMB", "CKMBINDEX", "  TROPONINI" in the last 168 hours. BNP (last 3 results) No results for input(s): "PROBNP" in the last 8760 hours. HbA1C: Recent Labs    11/26/22 1857  HGBA1C 12.9*   CBG: Recent Labs  Lab 11/27/22 1705 11/27/22 2024 11/28/22 0032 11/28/22 0513 11/28/22 0806  GLUCAP 352* 239* 228* 270* 201*   Lipid Profile: No results for input(s): "CHOL", "HDL", "LDLCALC", "TRIG", "CHOLHDL", "LDLDIRECT" in the last 72 hours. Thyroid Function Tests: No results for input(s): "TSH", "T4TOTAL", "FREET4", "T3FREE", "THYROIDAB" in the last 72 hours. Anemia Panel: No results for input(s): "VITAMINB12", "FOLATE", "FERRITIN", "TIBC", "IRON", "RETICCTPCT" in the last 72 hours. Sepsis Labs: No results for input(s): "PROCALCITON", "LATICACIDVEN" in the last 168 hours.  No results found for this or any previous visit (from the past 240 hour(s)).   Radiology Studies: DG ESOPHAGUS W SINGLE CM (SOL OR THIN BA)  Result Date: 11/28/2022 CLINICAL DATA:  History of DM II admitted for hyperglycemia with metabolic alkalosis complaining of nausea vomiting x1 week. Patient referred for single contrast fluoroscopic esophagram study. EXAM: ESOPHAGUS/BARIUM SWALLOW/TABLET STUDY TECHNIQUE: Single contrast  examination was performed using thin liquid barium. This exam was performed by Narda Rutherford, NP, and was supervised and interpreted by Suzy Bouchard, MD. FLUOROSCOPY: Radiation Exposure Index (as provided by the fluoroscopic device): 13.00 mGy Kerma COMPARISON:  CT AP dated November 22, 2022. FINDINGS: Swallowing: Appears normal. No vestibular penetration or aspiration seen. Pharynx: Unremarkable. Esophagus: Normal appearance. Esophageal motility: Within normal limits. Hiatal Hernia: Small hiatal hernia. Gastroesophageal reflux: No reflux noted with provocation maneuvers. Ingested 50m barium tablet: Not given Other: None. IMPRESSION: Small hiatal hernia. No esophageal mucosal irregularity, stricture or mass. No gastroesophageal reflux. Read by: SNarda Rutherford AGNP-BC Electronically Signed   By: SSuzy BouchardM.D.   On: 11/28/2022 09:27   NM GASTRIC EMPTYING  Result Date: 11/27/2022 CLINICAL DATA:  Concern for gastroparesis EXAM: NUCLEAR MEDICINE GASTRIC EMPTYING SCAN TECHNIQUE: After oral ingestion of radiolabeled meal, sequential abdominal images were obtained for 4 hours. Percentage of activity emptying the stomach was calculated at 1 hour, 2 hour, 3 hour, and 4 hours. RADIOPHARMACEUTICALS:  1.9 mCi [id number] colloid in standardized meal COMPARISON:  CT November 22, 2022 FINDINGS: Expected location of the stomach in the left upper quadrant. Ingested meal empties the stomach gradually over the course of the study. 58% emptied at 1 hr ( normal >= 10%) 60% emptied at 2 hr ( normal >= 40%) 68% emptied at 3 hr ( normal >= 70%) 87% emptied at 4 hr ( normal >= 90%) IMPRESSION: Slightly reduced gastric emptying suspicious for gastroparesis. Electronically Signed   By: JeDahlia Bailiff.D.   On: 11/27/2022 14:39   DG ABD ACUTE 2+V W 1V CHEST  Result Date: 11/26/2022 CLINICAL DATA:  Vomiting and weakness. EXAM: DG ABDOMEN ACUTE WITH 1 VIEW CHEST COMPARISON:  CT of the abdomen on 11/22/2022 and chest x-ray on  07/16/2022 FINDINGS: From chest radiograph demonstrates normal heart size and mediastinal contours. There is no evidence of pulmonary edema, consolidation, pneumothorax, nodule or pleural fluid. Abdominal films demonstrate normal bowel gas pattern without evidence of obstruction, ileus or free air. No abnormal calcifications identified. Visualized bony structures are unremarkable. IMPRESSION: Negative abdominal radiographs.  No acute cardiopulmonary disease. Electronically Signed   By: GlAletta Edouard.D.   On: 11/26/2022 15:28    Scheduled Meds:  gabapentin  300 mg Oral QHS   insulin aspart  0-20 Units Subcutaneous Q4H   insulin aspart protamine- aspart  35 Units  Subcutaneous BID WC   metoCLOPramide (REGLAN) injection  5 mg Intravenous TID AC   pantoprazole (PROTONIX) IV  40 mg Intravenous Q12H   potassium chloride  40 mEq Oral Q4H   rivaroxaban  10 mg Oral QHS   sodium chloride flush  3 mL Intravenous Q12H   Continuous Infusions:  lactated ringers 1,000 mL with potassium chloride 20 mEq infusion 125 mL/hr at 11/26/22 2023     LOS: 2 days   Darliss Cheney, MD [facility]  11/28/2022, 9:54 AM   *Please note that this is a verbal dictation therefore any spelling or grammatical errors are due to the "[place]" system interpretation.  Please page via [place] and do not message via secure chat for urgent patient care matters. Secure chat can be used for non urgent patient care matters.  How to contact the [facility] Attending or Consulting provider [place] or covering provider during after hours [place], for this patient?  Check the care team in [facility] and look for a) attending/consulting TRH provider listed and b) the [facility] team listed. Page or secure chat [phone]. Log into [email or web address] and use [facility]'s universal password to access. If you do not have the password, please contact the hospital operator. Locate the [facility] provider you are looking for under [facility] and page to a  number that you can be directly reached. If you still have difficulty reaching the provider, please page the [facility] (Director on Call) for the Hospitalists listed on amion for assistance.

## 2022-11-29 DIAGNOSIS — E8729 Other acidosis: Secondary | ICD-10-CM | POA: Diagnosis not present

## 2022-11-29 LAB — BASIC METABOLIC PANEL
Anion gap: 8 (ref 5–15)
BUN: 9 mg/dL (ref 6–20)
CO2: 26 mmol/L (ref 22–32)
Calcium: 8.6 mg/dL — ABNORMAL LOW (ref 8.9–10.3)
Chloride: 106 mmol/L (ref 98–111)
Creatinine, Ser: 0.7 mg/dL (ref 0.61–1.24)
GFR, Estimated: >60 mL/min (ref >60)
Glucose, Bld: 184 mg/dL — ABNORMAL HIGH (ref 70–99)
Potassium: 3.3 mmol/L — ABNORMAL LOW (ref 3.5–5.1)
Sodium: 140 mmol/L (ref 135–145)

## 2022-11-29 LAB — GLUCOSE, CAPILLARY
Glucose-Capillary: 146 mg/dL — ABNORMAL HIGH (ref 70–99)
Glucose-Capillary: 162 mg/dL — ABNORMAL HIGH (ref 70–99)
Glucose-Capillary: 174 mg/dL — ABNORMAL HIGH (ref 70–99)
Glucose-Capillary: 199 mg/dL — ABNORMAL HIGH (ref 70–99)
Glucose-Capillary: 212 mg/dL — ABNORMAL HIGH (ref 70–99)
Glucose-Capillary: 94 mg/dL (ref 70–99)

## 2022-11-29 MED ORDER — CALCIUM CARBONATE ANTACID 500 MG PO CHEW
400.0000 mg | CHEWABLE_TABLET | Freq: Four times a day (QID) | ORAL | Status: DC | PRN
  Administered 2022-11-29 – 2022-11-30 (×3): 400 mg via ORAL
  Filled 2022-11-29 (×3): qty 2

## 2022-11-29 MED ORDER — METOCLOPRAMIDE HCL 5 MG/ML IJ SOLN
10.0000 mg | Freq: Three times a day (TID) | INTRAMUSCULAR | Status: DC
  Administered 2022-11-29 – 2022-11-30 (×4): 10 mg via INTRAVENOUS
  Filled 2022-11-29 (×4): qty 2

## 2022-11-29 MED ORDER — INSULIN ASPART PROT & ASPART (70-30 MIX) 100 UNIT/ML ~~LOC~~ SUSP
40.0000 [IU] | Freq: Two times a day (BID) | SUBCUTANEOUS | Status: DC
  Administered 2022-11-29 – 2022-11-30 (×2): 40 [IU] via SUBCUTANEOUS

## 2022-11-29 MED ORDER — POTASSIUM CHLORIDE CRYS ER 20 MEQ PO TBCR
40.0000 meq | EXTENDED_RELEASE_TABLET | ORAL | Status: AC
  Administered 2022-11-29 (×2): 40 meq via ORAL
  Filled 2022-11-29 (×2): qty 2

## 2022-11-29 NOTE — Progress Notes (Signed)
PROGRESS NOTE    Shawn Benson  [id number] DOB: [birth year] DOA: 11/26/2022 PCP: Mack Hook, MD   Brief Narrative:  HPI: Shawn Benson is a 42 y.o. male with medical history significant of asthma, DM2, obesity who presents for hyperglycemia, nausea, vomiting and inability to keep anything down since 11/21/22.  He notes that he did not know of anything that changed during that time.  He has not been able to keep anything down.  When he does drink fluids or eat something, it will immediately come back up.  He notes a sensation of dysphagia when he eat and then a sensation of burning chest pain when he throws up.  He is vomiting up bile, yellowish fluid and previously food.  He has only taken his insulin once since this started.  He has never had this happen before.  He is not aware of any inciting event.  He notes that he has had DM2 for about 4-5 years and has been taking his insulin prior to this event.  He denies substernal chest pain, fever, chills, diarrhea, constipation, rash.  He does have pain in the legs which is aching and also sensitive to touch.  He has no swelling or any skin breakdown.    ED Course: In the ED, he was found to be hyperglycemic, with an elevated AG, Bicarb of 20.  His pH on VBG, however, was 7.45.  He denies any ingestions.  WBC was elevated at 14.1, with elevated monocytes.  Renal function is preserved. UA was not consistent with infection.  CXR did not show any acute disease.  LFTs are WNL.  Lipase is 30  Assessment & Plan:   Principal Problem:   High anion gap metabolic acidosis Active Problems:   Type 2 diabetes mellitus without complication, with long-term current use of insulin (HCC)   Morbid obesity due to excess calories (HCC)   Sleep apnea   Dysphagia   Nausea and vomiting   Diabetic gastroparesis (HCC)  High anion gap metabolic acidosis/type 2 diabetes mellitus: his acidosis has resolved.  Anion gap is closed as well. He appears to be  taking 70/30, 60 units twice daily, he is currently on 35 units, still slightly hyperglycemic, will increase that to 40 units and continue SSI.  Nausea/vomiting/dysphagia/dehydration/diabetic gastroparesis: Patient underwent gastric emptying study which shows mild gastroparesis.  Patient was also complaining of dysphagia for which barium swallow was completed which was unremarkable.  Patient is improving however he claims that in the middle of the night, he had another episode of vomiting and he still complains of epigastric pain.  However he is on soft diet.  I will continue current soft diet and increase Reglan to 10 mg 3 times daily Premeal.  Continue PPI twice daily.  Hypokalemia: Low again.  Will replace again today.  Recheck in the morning.  Hyponatremia: Resolved.  Obesity: Weight loss and dietary modification counseled.  History of sleep apnea: Unsure whether he uses CPAP at night or not.  Leukocytosis: Nonspecific and resolved.  Hyperlipidemia: Takes atorvastatin at home which I am going to hold for now since he is symptomatic with nausea/vomiting.  DVT prophylaxis: rivaroxaban (XARELTO) tablet 10 mg Start: 11/26/22 2200   Code Status: Full Code  Family Communication:  None present at bedside.  Plan of care discussed with patient in length and he/she verbalized understanding and agreed with it.  Also discussed with his wife over the phone.  Status is: Inpatient Remains inpatient appropriate because: Still symptomatic with  nausea and heartburn.   Estimated body mass index is 38.3 kg/m as calculated from the following:   Height as of this encounter: 6' 7"$  (2.007 m).   Weight as of this encounter: 154.2 kg.    Nutritional Assessment: Body mass index is 38.3 kg/m.Marland Kitchen Seen by dietician.  I agree with the assessment and plan as outlined below: Nutrition Status:        . Skin Assessment: I have examined the patient's skin and I agree with the wound assessment as performed by  the wound care RN as outlined below:    Consultants:  None  Procedures:  None  Antimicrobials:  Anti-infectives (From admission, onward)    None         Subjective: Seen and examined.  He claims that he is improving but overnight he had 1 episode of vomiting and now has mild abdominal pain.  Objective: Vitals:   11/28/22 0510 11/28/22 1425 11/28/22 2037 11/29/22 0427  BP: 139/81 135/72 (!) 140/96 (!) 161/85  Pulse: 80 71 90 71  Resp: 18 20 18 18  $ Temp: 98.2 F (36.8 C) 98.2 F (36.8 C) 98.2 F (36.8 C) 98.6 F (37 C)  TempSrc: Oral Oral Oral Oral  SpO2: 100% 97% 98% 98%  Weight:      Height:        Intake/Output Summary (Last 24 hours) at 11/29/2022 1008 Last data filed at 11/29/2022 0444 Gross per 24 hour  Intake 120 ml  Output 100 ml  Net 20 ml    Filed Weights   11/27/22 0034  Weight: (!) 154.2 kg    Examination:  General exam: Appears calm and comfortable  Respiratory system: Clear to auscultation. Respiratory effort normal. Cardiovascular system: S1 & S2 heard, RRR. No JVD, murmurs, rubs, gallops or clicks. No pedal edema. Gastrointestinal system: Abdomen is nondistended, soft and mild epigastric tenderness. No organomegaly or masses felt. Normal bowel sounds heard. Central nervous system: Alert and oriented. No focal neurological deficits. Extremities: Symmetric 5 x 5 power. Skin: No rashes, lesions or ulcers.  Psychiatry: Judgement and insight appear normal. Mood & affect appropriate.   Data Reviewed: I have personally reviewed following labs and imaging studies  CBC: Recent Labs  Lab 11/26/22 1455 11/27/22 0344 11/28/22 0624  WBC 14.1* 13.4* 10.4  NEUTROABS 10.2*  --  6.6  HGB 16.8 15.9 15.0  HCT 51.0 48.0 46.4  MCV 79.3* 79.2* 80.4  PLT 292 288 [id number]    Basic Metabolic Panel: Recent Labs  Lab 11/27/22 0726 11/27/22 1618 11/27/22 2125 11/28/22 0624 11/29/22 0706  NA 136 137 135 138 140  K 3.2* 3.6 3.3* 3.1* 3.3*  CL 97* 99  102 104 106  CO2 27 25 21* 22 26  GLUCOSE 225* 336* 257* 244* 184*  BUN 13 14 14 11 9  $ CREATININE 0.92 0.92 0.85 0.68 0.70  CALCIUM 8.6* 8.5* 8.7* 8.7* 8.6*    GFR: Estimated Creatinine Clearance: 198.5 mL/min (by C-G formula based on SCr of 0.7 mg/dL). Liver Function Tests: Recent Labs  Lab 11/26/22 1455  AST 10*  ALT 12  ALKPHOS 84  BILITOT 2.0*  PROT 6.8  ALBUMIN 3.4*    Recent Labs  Lab 11/26/22 1455  LIPASE 30    No results for input(s): "AMMONIA" in the last 168 hours. Coagulation Profile: No results for input(s): "INR", "PROTIME" in the last 168 hours. Cardiac Enzymes: No results for input(s): "CKTOTAL", "CKMB", "CKMBINDEX", "TROPONINI" in the last 168 hours. BNP (last 3 results) No  results for input(s): "PROBNP" in the last 8760 hours. HbA1C: Recent Labs    11/26/22 1857  HGBA1C 12.9*    CBG: Recent Labs  Lab 11/28/22 1637 11/28/22 2039 11/29/22 0001 11/29/22 0429 11/29/22 0743  GLUCAP 264* 115* 146* 162* 199*    Lipid Profile: No results for input(s): "CHOL", "HDL", "LDLCALC", "TRIG", "CHOLHDL", "LDLDIRECT" in the last 72 hours. Thyroid Function Tests: No results for input(s): "TSH", "T4TOTAL", "FREET4", "T3FREE", "THYROIDAB" in the last 72 hours. Anemia Panel: No results for input(s): "VITAMINB12", "FOLATE", "FERRITIN", "TIBC", "IRON", "RETICCTPCT" in the last 72 hours. Sepsis Labs: No results for input(s): "PROCALCITON", "LATICACIDVEN" in the last 168 hours.  No results found for this or any previous visit (from the past 240 hour(s)).   Radiology Studies: DG ESOPHAGUS W SINGLE CM (SOL OR THIN BA)  Result Date: 11/28/2022 CLINICAL DATA:  History of DM II admitted for hyperglycemia with metabolic alkalosis complaining of nausea vomiting x1 week. Patient referred for single contrast fluoroscopic esophagram study. EXAM: ESOPHAGUS/BARIUM SWALLOW/TABLET STUDY TECHNIQUE: Single contrast examination was performed using thin liquid barium. This  exam was performed by Narda Rutherford, NP, and was supervised and interpreted by Suzy Bouchard, MD. FLUOROSCOPY: Radiation Exposure Index (as provided by the fluoroscopic device): 13.00 mGy Kerma COMPARISON:  CT AP dated November 22, 2022. FINDINGS: Swallowing: Appears normal. No vestibular penetration or aspiration seen. Pharynx: Unremarkable. Esophagus: Normal appearance. Esophageal motility: Within normal limits. Hiatal Hernia: Small hiatal hernia. Gastroesophageal reflux: No reflux noted with provocation maneuvers. Ingested 64m barium tablet: Not given Other: None. IMPRESSION: Small hiatal hernia. No esophageal mucosal irregularity, stricture or mass. No gastroesophageal reflux. Read by: SNarda Rutherford AGNP-BC Electronically Signed   By: SSuzy BouchardM.D.   On: 11/28/2022 09:27   NM GASTRIC EMPTYING  Result Date: 11/27/2022 CLINICAL DATA:  Concern for gastroparesis EXAM: NUCLEAR MEDICINE GASTRIC EMPTYING SCAN TECHNIQUE: After oral ingestion of radiolabeled meal, sequential abdominal images were obtained for 4 hours. Percentage of activity emptying the stomach was calculated at 1 hour, 2 hour, 3 hour, and 4 hours. RADIOPHARMACEUTICALS:  1.9 mCi [id number] colloid in standardized meal COMPARISON:  CT November 22, 2022 FINDINGS: Expected location of the stomach in the left upper quadrant. Ingested meal empties the stomach gradually over the course of the study. 58% emptied at 1 hr ( normal >= 10%) 60% emptied at 2 hr ( normal >= 40%) 68% emptied at 3 hr ( normal >= 70%) 87% emptied at 4 hr ( normal >= 90%) IMPRESSION: Slightly reduced gastric emptying suspicious for gastroparesis. Electronically Signed   By: JeDahlia Bailiff.D.   On: 11/27/2022 14:39    Scheduled Meds:  gabapentin  300 mg Oral QHS   insulin aspart  0-20 Units Subcutaneous Q4H   insulin aspart protamine- aspart  35 Units Subcutaneous BID WC   metoCLOPramide (REGLAN) injection  10 mg Intravenous TID AC   pantoprazole (PROTONIX) IV  40  mg Intravenous Q12H   rivaroxaban  10 mg Oral QHS   sodium chloride flush  3 mL Intravenous Q12H   Continuous Infusions:  lactated ringers 1,000 mL with potassium chloride 20 mEq infusion 125 mL/hr at 11/29/22 0933     LOS: 3 days   RaDarliss CheneyMD [facility]  11/29/2022, 10:08 AM   *Please note that this is a verbal dictation therefore any spelling or grammatical errors are due to the "[place]" system interpretation.  Please page via [place] do not message via secure chat for urgent patient  care matters. Secure chat can be used for non urgent patient care matters.  How to contact the [facility] Attending or Consulting provider [place] or covering provider during after hours [place], for this patient?  Check the care team in [facility] and look for a) attending/consulting TRH provider listed and b) the [facility] team listed. Page or secure chat [phone]. Log into [email or web address] and use [place]'s universal password to access. If you do not have the password, please contact the hospital operator. Locate the [facility] provider you are looking for under [facility] and page to a number that you can be directly reached. If you still have difficulty reaching the provider, please page the [facility] (Director on Call) for the Hospitalists listed on amion for assistance.

## 2022-11-30 ENCOUNTER — Other Ambulatory Visit (HOSPITAL_COMMUNITY): Payer: Self-pay

## 2022-11-30 DIAGNOSIS — K3184 Gastroparesis: Secondary | ICD-10-CM | POA: Diagnosis not present

## 2022-11-30 DIAGNOSIS — E1143 Type 2 diabetes mellitus with diabetic autonomic (poly)neuropathy: Secondary | ICD-10-CM | POA: Diagnosis not present

## 2022-11-30 DIAGNOSIS — E876 Hypokalemia: Secondary | ICD-10-CM | POA: Insufficient documentation

## 2022-11-30 LAB — BASIC METABOLIC PANEL
Anion gap: 7 (ref 5–15)
BUN: 7 mg/dL (ref 6–20)
CO2: 27 mmol/L (ref 22–32)
Calcium: 8.5 mg/dL — ABNORMAL LOW (ref 8.9–10.3)
Chloride: 104 mmol/L (ref 98–111)
Creatinine, Ser: 0.65 mg/dL (ref 0.61–1.24)
GFR, Estimated: >60 mL/min (ref >60)
Glucose, Bld: 154 mg/dL — ABNORMAL HIGH (ref 70–99)
Potassium: 3.1 mmol/L — ABNORMAL LOW (ref 3.5–5.1)
Sodium: 138 mmol/L (ref 135–145)

## 2022-11-30 LAB — GLUCOSE, CAPILLARY
Glucose-Capillary: 125 mg/dL — ABNORMAL HIGH (ref 70–99)
Glucose-Capillary: 162 mg/dL — ABNORMAL HIGH (ref 70–99)
Glucose-Capillary: 169 mg/dL — ABNORMAL HIGH (ref 70–99)
Glucose-Capillary: 225 mg/dL — ABNORMAL HIGH (ref 70–99)

## 2022-11-30 MED ORDER — ONDANSETRON HCL 4 MG PO TABS: 4.0000 mg | ORAL_TABLET | Freq: Three times a day (TID) | ORAL | 0 refills | Status: DC | PRN

## 2022-11-30 MED ORDER — POTASSIUM CHLORIDE 10 MEQ/100ML IV SOLN
10.0000 meq | INTRAVENOUS | Status: DC
  Administered 2022-11-30: 10 meq via INTRAVENOUS
  Filled 2022-11-30: qty 100

## 2022-11-30 MED ORDER — METOCLOPRAMIDE HCL 10 MG PO TABS: 10.0000 mg | ORAL_TABLET | Freq: Three times a day (TID) | ORAL | 2 refills | Status: DC

## 2022-11-30 MED ORDER — POTASSIUM CHLORIDE 10 MEQ/100ML IV SOLN
INTRAVENOUS | Status: AC
  Administered 2022-11-30: 10 meq via INTRAVENOUS
  Filled 2022-11-30: qty 100

## 2022-11-30 MED ORDER — POTASSIUM CHLORIDE 10 MEQ/100ML IV SOLN
10.0000 meq | INTRAVENOUS | Status: AC
  Administered 2022-11-30 (×2): 10 meq via INTRAVENOUS

## 2022-11-30 MED ORDER — PANTOPRAZOLE SODIUM 40 MG PO TBEC
40.0000 mg | DELAYED_RELEASE_TABLET | Freq: Two times a day (BID) | ORAL | Status: DC
  Administered 2022-11-30: 40 mg via ORAL
  Filled 2022-11-30: qty 1

## 2022-11-30 MED ORDER — POTASSIUM CHLORIDE CRYS ER 20 MEQ PO TBCR
40.0000 meq | EXTENDED_RELEASE_TABLET | ORAL | Status: DC
  Administered 2022-11-30: 40 meq via ORAL
  Filled 2022-11-30: qty 2

## 2022-11-30 MED ORDER — PANTOPRAZOLE SODIUM 40 MG PO TBEC: 40.0000 mg | DELAYED_RELEASE_TABLET | Freq: Two times a day (BID) | ORAL | 2 refills | Status: DC

## 2022-11-30 NOTE — Inpatient Diabetes Management (Signed)
Inpatient Diabetes Program Recommendations  AACE/ADA: New Consensus Statement on Inpatient Glycemic Control (2015)  Target Ranges:  Prepandial:   less than 140 mg/dL      Peak postprandial:   less than 180 mg/dL (1-2 hours)      Critically ill patients:  140 - 180 mg/dL   Lab Results  Component Value Date   GLUCAP 225 (H) 11/30/2022   HGBA1C 12.9 (H) 11/26/2022    Review of Glycemic Control  Diabetes history: DM2 Outpatient Diabetes medications: Novolin 70/30 70 units QAM Current orders for Inpatient glycemic control: 70/30 40 units BID, Novolog 0-20 Q4H  Inpatient Diabetes Program Recommendations:    Spoke with pt at bedside regarding his diabetes and reviewed diet, exercise, monitoring and taking insulin as prescribed. To f/u with Dr Amil Amen. Checked on CGM and insurance says it requires pre-authorization. Gave info to pt and he will f/u with PCP. States he's motivated to keep his blood sugars under control to feel better and reduce risks of both long and short-term complications. Reviewed hypoglycemia s/s and treatment. Answered questions. To discharge today.  Thank you. Lorenda Peck, RD, LDN, [place] Inpatient Diabetes Coordinator [phone]

## 2022-11-30 NOTE — Discharge Summary (Signed)
Physician Discharge Summary  Shawn Benson [id number] DOB: 04-Jan-1981 DOA: 11/26/2022  PCP: Mack Hook, MD  Admit date: 11/26/2022 Discharge date: 11/30/2022  Admitted From: Home Disposition:  Home   Recommendations for Outpatient Follow-up:  Follow up with PCP in 1 week  Discharge Condition: Stable CODE STATUS: Full  Diet recommendation: Carb modified   Brief/Interim Summary: Shawn Benson is a 42 y.o. male with medical history significant of asthma, DM2, obesity who presents for hyperglycemia, nausea, vomiting and inability to keep anything down since 11/21/22.  He notes that he did not know of anything that changed during that time.  He has not been able to keep anything down.  When he does drink fluids or eat something, it will immediately come back up.  He notes a sensation of dysphagia when he eat and then a sensation of burning chest pain when he throws up.  He is vomiting up bile, yellowish fluid and previously food.  He has only taken his insulin once since this started.  He has never had this happen before.  He is not aware of any inciting event.  He notes that he has had DM2 for about 4-5 years and has been taking his insulin prior to this event.  He denies substernal chest pain, fever, chills, diarrhea, constipation, rash.  He does have pain in the legs which is aching and also sensitive to touch.  He has no swelling or any skin breakdown.    ED Course: In the ED, he was found to be hyperglycemic, with an elevated AG, Bicarb of 20.  His pH on VBG, however, was 7.45.  He denies any ingestions.  WBC was elevated at 14.1, with elevated monocytes.  Renal function is preserved. UA was not consistent with infection.  CXR did not show any acute disease.  LFTs are WNL.  Lipase is 30.  Patient's anion gap and acidosis resolved.  Patient was resumed on his insulin.  Nausea, vomiting as well as dysphagia was secondary to diabetic gastroparesis which was confirmed by gastric  emptying study.  His barium swallow study was unremarkable.  Patient's diet was advanced with tolerance.  He was treated with Reglan as well as PPI.  Discharge Diagnoses:   Principal Problem:   Diabetic gastroparesis ([place]) Active Problems:   Type 2 diabetes mellitus without complication, with long-term current use of insulin ([place])   Morbid obesity due to excess calories ([facility])   Sleep apnea   Dyslipidemia   High anion gap metabolic acidosis   Dysphagia   Nausea and vomiting   Hypokalemia    Discharge Instructions  Discharge Instructions     Call MD for:  difficulty breathing, headache or visual disturbances   Complete by: As directed    Call MD for:  extreme fatigue   Complete by: As directed    Call MD for:  persistant dizziness or light-headedness   Complete by: As directed    Call MD for:  persistant nausea and vomiting   Complete by: As directed    Call MD for:  severe uncontrolled pain   Complete by: As directed    Call MD for:  temperature >100.4   Complete by: As directed    Diet Carb Modified   Complete by: As directed    Discharge instructions   Complete by: As directed    You were cared for by a hospitalist during your hospital stay. If you have any questions about your discharge medications or the care you received  while you were in the hospital after you are discharged, you can call the unit and ask to speak with the hospitalist on call if the hospitalist that took care of you is not available. Once you are discharged, your primary care physician will handle any further medical issues. Please note that NO REFILLS for any discharge medications will be authorized once you are discharged, as it is imperative that you return to your primary care physician (or establish a relationship with a primary care physician if you do not have one) for your aftercare needs so that they can reassess your need for medications and monitor your lab values.   Increase activity slowly    Complete by: As directed       Allergies as of 11/30/2022   No Known Allergies      Medication List     STOP taking these medications    amoxicillin-clavulanate 1000-62.5 MG 12 hr tablet Commonly known as: AUGMENTIN XR   amoxicillin-clavulanate 875-125 MG tablet Commonly known as: AUGMENTIN   atorvastatin 20 MG tablet Commonly known as: LIPITOR   dicyclomine 20 MG tablet Commonly known as: BENTYL   FreeStyle Libre 2 Reader [employer] 2 Sensor Misc   gabapentin 300 MG capsule Commonly known as: NEURONTIN   ketoconazole 2 % cream Commonly known as: NIZORAL   ondansetron 4 MG disintegrating tablet Commonly known as: ZOFRAN-ODT Replaced by: ondansetron 4 MG tablet   terbinafine 250 MG tablet Commonly known as: LAMISIL       TAKE these medications    Accu-Chek Aviva Plus w/Device Kit Check blood sugar before meals 3 times daily   insulin NPH-regular Human (70-30) 100 UNIT/ML injection Inject 52 Units into the skin 2 (two) times daily with a meal. 52 units subcutaneously twice daily before meals What changed:  how much to take when to take this additional instructions   INSULIN SYRINGE .5CC/31GX5/16" 31G X 5/16" 0.5 ML Misc 1 Device by Does not apply route 2 (two) times daily with a meal.   metoCLOPramide 10 MG tablet Commonly known as: REGLAN Take 1 tablet (10 mg total) by mouth 3 (three) times daily with meals.   ondansetron 4 MG tablet Commonly known as: ZOFRAN Take 1 tablet (4 mg total) by mouth every 8 (eight) hours as needed for nausea or vomiting. Replaces: ondansetron 4 MG disintegrating tablet   pantoprazole 40 MG tablet Commonly known as: PROTONIX Take 1 tablet (40 mg total) by mouth 2 (two) times daily before a meal.        Follow-up Information     Mack Hook, MD Follow up.   Specialty: Internal Medicine Contact information: [place] [address] [phone]                No  Known Allergies    Procedures/Studies: DG ESOPHAGUS W SINGLE CM (SOL OR THIN BA)  Result Date: 11/28/2022 CLINICAL DATA:  History of DM II admitted for hyperglycemia with metabolic alkalosis complaining of nausea vomiting x1 week. Patient referred for single contrast fluoroscopic esophagram study. EXAM: ESOPHAGUS/BARIUM SWALLOW/TABLET STUDY TECHNIQUE: Single contrast examination was performed using thin liquid barium. This exam was performed by Narda Rutherford, NP, and was supervised and interpreted by Suzy Bouchard, MD. FLUOROSCOPY: Radiation Exposure Index (as provided by the fluoroscopic device): 13.00 mGy Kerma COMPARISON:  CT AP dated November 22, 2022. FINDINGS: Swallowing: Appears normal. No vestibular penetration or aspiration seen. Pharynx: Unremarkable. Esophagus: Normal appearance. Esophageal motility: Within normal limits.  Hiatal Hernia: Small hiatal hernia. Gastroesophageal reflux: No reflux noted with provocation maneuvers. Ingested 56m barium tablet: Not given Other: None. IMPRESSION: Small hiatal hernia. No esophageal mucosal irregularity, stricture or mass. No gastroesophageal reflux. Read by: SNarda Rutherford AGNP-BC Electronically Signed   By: SSuzy BouchardM.D.   On: 11/28/2022 09:27   NM GASTRIC EMPTYING  Result Date: 11/27/2022 CLINICAL DATA:  Concern for gastroparesis EXAM: NUCLEAR MEDICINE GASTRIC EMPTYING SCAN TECHNIQUE: After oral ingestion of radiolabeled meal, sequential abdominal images were obtained for 4 hours. Percentage of activity emptying the stomach was calculated at 1 hour, 2 hour, 3 hour, and 4 hours. RADIOPHARMACEUTICALS:  1.9 mCi Tc-960mulfur colloid in standardized meal COMPARISON:  CT November 22, 2022 FINDINGS: Expected location of the stomach in the left upper quadrant. Ingested meal empties the stomach gradually over the course of the study. 58% emptied at 1 hr ( normal >= 10%) 60% emptied at 2 hr ( normal >= 40%) 68% emptied at 3 hr ( normal >= 70%) 87% emptied  at 4 hr ( normal >= 90%) IMPRESSION: Slightly reduced gastric emptying suspicious for gastroparesis. Electronically Signed   By: JeDahlia Bailiff.D.   On: 11/27/2022 14:39   DG ABD ACUTE 2+V W 1V CHEST  Result Date: 11/26/2022 CLINICAL DATA:  Vomiting and weakness. EXAM: DG ABDOMEN ACUTE WITH 1 VIEW CHEST COMPARISON:  CT of the abdomen on 11/22/2022 and chest x-ray on 07/16/2022 FINDINGS: From chest radiograph demonstrates normal heart size and mediastinal contours. There is no evidence of pulmonary edema, consolidation, pneumothorax, nodule or pleural fluid. Abdominal films demonstrate normal bowel gas pattern without evidence of obstruction, ileus or free air. No abnormal calcifications identified. Visualized bony structures are unremarkable. IMPRESSION: Negative abdominal radiographs.  No acute cardiopulmonary disease. Electronically Signed   By: GlAletta Edouard.D.   On: 11/26/2022 15:28   CT ABDOMEN PELVIS W CONTRAST  Result Date: 11/22/2022 CLINICAL DATA:  Abdominal pain with nausea and vomiting. EXAM: CT ABDOMEN AND PELVIS WITH CONTRAST TECHNIQUE: Multidetector CT imaging of the abdomen and pelvis was performed using the standard protocol following bolus administration of intravenous contrast. RADIATION DOSE REDUCTION: This exam was performed according to the departmental dose-optimization program which includes automated exposure control, adjustment of the mA and/or kV according to patient size and/or use of iterative reconstruction technique. CONTRAST:  10026mMNIPAQUE IOHEXOL 300 MG/ML  SOLN COMPARISON:  October 05, 2015 FINDINGS: Lower chest: No acute abnormality. Hepatobiliary: No focal liver abnormality is seen. No gallstones, gallbladder wall thickening, or biliary dilatation. Pancreas: Unremarkable. No pancreatic ductal dilatation or surrounding inflammatory changes. Spleen: Normal in size without focal abnormality. Adrenals/Urinary Tract: Adrenal glands are unremarkable. Kidneys are normal,  without renal calculi, focal lesion, or hydronephrosis. Bladder is unremarkable. Stomach/Bowel: Stomach is within normal limits. Appendix appears normal. No evidence of bowel wall thickening, distention, or inflammatory changes. Vascular/Lymphatic: No significant vascular findings are present. No enlarged abdominal or pelvic lymph nodes. Reproductive: Prostate is unremarkable. Other: No abdominal wall hernia or abnormality. No abdominopelvic ascites. Musculoskeletal: No acute or significant osseous findings. IMPRESSION: No acute abnormality seen in the abdomen or pelvis. Electronically Signed   By: ThaVirgina NorfolkD.   On: 11/22/2022 02:30      Discharge Exam: Vitals:   11/29/22 2036 11/30/22 0553  BP: 129/72 109/66  Pulse: 73 78  Resp: 20 20  Temp: 98.1 F (36.7 C) 98.4 F (36.9 C)  SpO2: 98% 97%    General: Pt is alert, awake, not in acute  distress Cardiovascular: RRR, S1/S2 +, no edema Respiratory: CTA bilaterally, no wheezing, no rhonchi, no respiratory distress, no conversational dyspnea  Abdominal: Soft, NT, ND, bowel sounds + Extremities: no edema, no cyanosis Psych: Normal mood and affect, stable judgement and insight     The results of significant diagnostics from this hospitalization (including imaging, microbiology, ancillary and laboratory) are listed below for reference.     Microbiology: No results found for this or any previous visit (from the past 240 hour(s)).   Labs: BNP (last 3 results) Recent Labs    07/16/22 1813  BNP 0000000   Basic Metabolic Panel: Recent Labs  Lab 11/27/22 1618 11/27/22 2125 11/28/22 0624 11/29/22 0706 11/30/22 0539  NA 137 135 138 140 138  K 3.6 3.3* 3.1* 3.3* 3.1*  CL 99 102 104 106 104  CO2 25 21* '22 26 27  '$ GLUCOSE 336* 257* 244* 184* 154*  BUN '14 14 11 9 7  '$ CREATININE 0.92 0.85 0.68 0.70 0.65  CALCIUM 8.5* 8.7* 8.7* 8.6* 8.5*   Liver Function Tests: Recent Labs  Lab 11/26/22 1455  AST 10*  ALT 12  ALKPHOS 84   BILITOT 2.0*  PROT 6.8  ALBUMIN 3.4*   Recent Labs  Lab 11/26/22 1455  LIPASE 30   No results for input(s): "AMMONIA" in the last 168 hours. CBC: Recent Labs  Lab 11/26/22 1455 11/27/22 0344 11/28/22 0624  WBC 14.1* 13.4* 10.4  NEUTROABS 10.2*  --  6.6  HGB 16.8 15.9 15.0  HCT 51.0 48.0 46.4  MCV 79.3* 79.2* 80.4  PLT 292 288 297   Cardiac Enzymes: No results for input(s): "CKTOTAL", "CKMB", "CKMBINDEX", "TROPONINI" in the last 168 hours. BNP: Invalid input(s): "POCBNP" CBG: Recent Labs  Lab 11/29/22 1648 11/29/22 2038 11/30/22 0056 11/30/22 0401 11/30/22 0732  GLUCAP 174* 94 169* 125* 162*   D-Dimer No results for input(s): "DDIMER" in the last 72 hours. Hgb A1c No results for input(s): "HGBA1C" in the last 72 hours. Lipid Profile No results for input(s): "CHOL", "HDL", "LDLCALC", "TRIG", "CHOLHDL", "LDLDIRECT" in the last 72 hours. Thyroid function studies No results for input(s): "TSH", "T4TOTAL", "T3FREE", "THYROIDAB" in the last 72 hours.  Invalid input(s): "FREET3" Anemia work up No results for input(s): "VITAMINB12", "FOLATE", "FERRITIN", "TIBC", "IRON", "RETICCTPCT" in the last 72 hours. Urinalysis    Component Value Date/Time   COLORURINE STRAW (A) 11/26/2022 1701   APPEARANCEUR CLEAR 11/26/2022 1701   LABSPEC 1.016 11/26/2022 1701   PHURINE 5.0 11/26/2022 1701   GLUCOSEU >=500 (A) 11/26/2022 1701   HGBUR NEGATIVE 11/26/2022 1701   BILIRUBINUR NEGATIVE 11/26/2022 1701   BILIRUBINUR neg 07/22/2017 1114   KETONESUR 80 (A) 11/26/2022 1701   PROTEINUR NEGATIVE 11/26/2022 1701   UROBILINOGEN 0.2 07/22/2017 1114   NITRITE NEGATIVE 11/26/2022 1701   LEUKOCYTESUR NEGATIVE 11/26/2022 1701   Sepsis Labs Recent Labs  Lab 11/26/22 1455 11/27/22 0344 11/28/22 0624  WBC 14.1* 13.4* 10.4   Microbiology No results found for this or any previous visit (from the past 240 hour(s)).   Patient was seen and examined on the day of discharge and was  found to be in stable condition. Time coordinating discharge: 35 minutes including assessment and coordination of care, as well as examination of the patient.   SIGNED:  Dessa Phi, DO [facility] 11/30/2022, 10:47 AM

## 2022-11-30 NOTE — Plan of Care (Signed)

## 2022-12-03 DIAGNOSIS — E1165 Type 2 diabetes mellitus with hyperglycemia: Secondary | ICD-10-CM | POA: Diagnosis not present

## 2022-12-10 DIAGNOSIS — M7989 Other specified soft tissue disorders: Secondary | ICD-10-CM | POA: Diagnosis not present

## 2022-12-10 DIAGNOSIS — M79605 Pain in left leg: Secondary | ICD-10-CM | POA: Diagnosis not present

## 2022-12-10 DIAGNOSIS — E1165 Type 2 diabetes mellitus with hyperglycemia: Secondary | ICD-10-CM | POA: Diagnosis not present

## 2022-12-10 DIAGNOSIS — M79604 Pain in right leg: Secondary | ICD-10-CM | POA: Diagnosis not present

## 2022-12-13 ENCOUNTER — Ambulatory Visit (HOSPITAL_COMMUNITY)
Admission: RE | Admit: 2022-12-13 | Discharge: 2022-12-13 | Disposition: A | Discharge status: Home / Self Care | Payer: BC Managed Care – PPO | Source: Ambulatory Visit | Attending: Physician Assistant | Admitting: Physician Assistant

## 2022-12-13 ENCOUNTER — Other Ambulatory Visit (HOSPITAL_COMMUNITY): Payer: Self-pay | Admitting: Physician Assistant

## 2022-12-13 ENCOUNTER — Ambulatory Visit (HOSPITAL_COMMUNITY): Admission: RE | Admit: 2022-12-13 | Payer: BC Managed Care – PPO | Source: Ambulatory Visit

## 2022-12-13 ENCOUNTER — Encounter (HOSPITAL_COMMUNITY): Payer: Self-pay

## 2022-12-13 DIAGNOSIS — M79606 Pain in leg, unspecified: Secondary | ICD-10-CM

## 2022-12-13 DIAGNOSIS — M7989 Other specified soft tissue disorders: Secondary | ICD-10-CM

## 2022-12-13 NOTE — Progress Notes (Signed)
Bilateral lower extremity venous study completed.   Preliminary results relayed to secretary at Dr. Raymond Gurney office.  Please see CV Procedures for preliminary results.  Kaydenn Mclear, RVT  3:00 PM 12/13/22

## 2022-12-14 DIAGNOSIS — E1142 Type 2 diabetes mellitus with diabetic polyneuropathy: Secondary | ICD-10-CM | POA: Diagnosis not present

## 2022-12-14 DIAGNOSIS — R2243 Localized swelling, mass and lump, lower limb, bilateral: Secondary | ICD-10-CM | POA: Diagnosis not present

## 2022-12-14 DIAGNOSIS — M7989 Other specified soft tissue disorders: Secondary | ICD-10-CM | POA: Diagnosis not present

## 2022-12-14 DIAGNOSIS — E1165 Type 2 diabetes mellitus with hyperglycemia: Secondary | ICD-10-CM | POA: Diagnosis not present

## 2022-12-26 ENCOUNTER — Encounter: Payer: Self-pay | Admitting: Podiatry

## 2022-12-26 ENCOUNTER — Ambulatory Visit (INDEPENDENT_AMBULATORY_CARE_PROVIDER_SITE_OTHER): Payer: BC Managed Care – PPO | Admitting: Podiatry

## 2022-12-26 DIAGNOSIS — S91332D Puncture wound without foreign body, left foot, subsequent encounter: Secondary | ICD-10-CM

## 2022-12-26 NOTE — Progress Notes (Signed)
No chief complaint on file.   HPI: 42 y.o. male presenting today for follow-up evaluation of a puncture wound to the left forefoot. Patient returned to work 11/08/2022 on light duty.  Patient states that he is doing well.  Over the past week he says that basically he has been full activity with no restrictions.  He would like to have an additional week of work restrictions to allow his foot to rest but he is doing well overall  Brief history patient states that on 10/11/2022 he stepped on a nail while at work.  He works at [employer].  He was eventually referred to the urgent care and also seen by Dr. Adah Perl here in our office.  Completed a course of Augmentin and Bactrim DS.  CT scan left foot completed 10/17/2022.  Patient states that he has been working light duty but he continues to have pain 7/10 on a daily basis.  He wears the postsurgical shoe.     Past Medical History:  Diagnosis Date   Allergy    Seasonal--spring through fall.   Asthma childhood and adult   triggers are allergies and URI.  Spring, summer and fall.   Diabetes mellitus without complication ([place])    Morbid obesity ([place])    Peripheral edema 04/09/2017    Past Surgical History:  Procedure Laterality Date   INCISION AND DRAINAGE PERIRECTAL ABSCESS N/A 10/06/2015   Procedure: IRRIGATION AND DEBRIDEMENT PERIANAL  ABSCESS;  Surgeon: Alphonsa Overall, MD;  Location: [facility];  Service: General;  Laterality: N/A;   KNEE SURGERY     Right and Left Knee    No Known Allergies   Physical Exam: General: The patient is alert and oriented x3 in no acute distress.  Dermatology: Skin is warm, dry and supple bilateral lower extremities. Negative for open lesions or macerations.  It appears that the puncture wound to the plantar aspect of the forefoot has healed.  Complete reepithelialization has occurred  Vascular: Palpable pedal pulses bilaterally. Capillary refill within normal limits.  Negative for any significant edema or  erythema  Neurological: Light touch and protective threshold grossly intact  Musculoskeletal Exam: No pedal deformities noted.  Negative for any appreciable tenderness throughout palpation to the left forefoot.  Radiographic Exam LT foot 10/17/2022:  FINDINGS: Slight hallux valgus deformity of the first ray with some hypertrophic degenerative changes of the first metatarsophalangeal joint. Mild degenerative changes of the dorsal aspect of the midfoot. Well corticated plantar and Achilles calcaneal spurs. No fracture or dislocation. Preserved bone mineralization. No definite soft tissue gas or radiopaque foreign body. Please correlate with the exact location of injury. If there is further concern of soft tissue or bone infection, additional cross-sectional imaging study could be performed as clinically indicated.   IMPRESSION: Degenerative changes.  Hallux valgus deformity of the first ray.  CT FOOT LEFT WO CONTRAST 10/17/2022: IMPRESSION: 1. No acute osseous abnormality. 2. Mild subcutaneous edema within the plantar aspect of the hindfoot. No subcutaneous fluid collection or retained radiopaque foreign body. 3. Atrophy of intrinsic musculature of the left foot. 4. Mild midfoot degenerative arthritis, most severe involving the talonavicular articulation.  Assessment: 1.  Puncture wound left forefoot -Completed course of Augmentin and Bactrim - Continue wearing good supportive shoes and sneakers.  Slowly increase activity -Patient may return to work full activity no restrictions beginning 01/07/2023.  Over the next week recommend that he slowly increase his activity and build up to full activity without restrictions -Return to clinic as  needed      Edrick Kins, DPM [facility]  Dr. Edrick Kins, DPM    [address], [place] [address]                Office [phone]  Fax [phone]

## 2022-12-27 ENCOUNTER — Encounter: Payer: Self-pay | Admitting: Nurse Practitioner

## 2022-12-27 ENCOUNTER — Ambulatory Visit (INDEPENDENT_AMBULATORY_CARE_PROVIDER_SITE_OTHER): Payer: BC Managed Care – PPO | Admitting: Nurse Practitioner

## 2022-12-27 VITALS — BP 115/80 | HR 88 | Ht 79.0 in | Wt 342.0 lb

## 2022-12-27 DIAGNOSIS — R112 Nausea with vomiting, unspecified: Secondary | ICD-10-CM | POA: Diagnosis not present

## 2022-12-27 DIAGNOSIS — Z794 Long term (current) use of insulin: Secondary | ICD-10-CM

## 2022-12-27 DIAGNOSIS — E119 Type 2 diabetes mellitus without complications: Secondary | ICD-10-CM

## 2022-12-27 NOTE — Progress Notes (Signed)
12/27/2022 Shawn Benson [id number] 18-Nov-1980   CHIEF COMPLAINT: Nausea and vomiting   HISTORY OF PRESENT ILLNESS: Shawn P. Lorman is a 42 year old male with a past medical history of asthma, morbid obesity, DM type II, perirectal abscess s/p I & D 2016. He presents to our office today for further evaluation regarding nausea and vomiting.  He developed abrupt onset of nausea with vomiting while at work on 11/21/2022.  At that time, he is about to eat a chicken salad sandwich but he developed nausea before eating it and he went home.  Later that afternoon, he started vomiting yellow emesis which was nonstop and he generally felt unwell. He presented to the ED 11/26/2022 for further evaluation.  Labs showed a WBC count of 14.1.  Hemoglobin 16.8.  Platelet 292.  Sodium 133.  K+ 3.2.  Glucose 423.  Total bili 2.0.  Alk phos 84.  AST 10.  ALT 12.  Anion gap 17.  Lipase 30.  HIV nonreactive.  Hemoglobin A1c 12.9.  CTAP 11/22/2022 did not identify any acute intra-abdominal/pelvic pathology to explain his symptoms. He also endorsed having heartburn with difficulty swallowing which occurred after he started vomiting. A barium swallow study was done 11/28/2022 which showed a small hiatal hernia without evidence of reflux, esophageal stricture esophageal or dysmotility. A gastric empty study was done while he was receiving antiemetic and pain meds which showed slightly reduced gastric emptying suspicious for gastroparesis. He received IV fluids and KCL replacement. He was restarted on insulin and his glucose level stabilized. He was discharged home on Reglan 10 mg 3 times daily to be taken prior to meals, Ondansetron 4 mg p.o. every 8 hours and Pantoprazole 40 mg twice daily.  He presents today for further GI evaluation.  He is accompanied by his wife.  He denies having any further nausea, vomiting, heartburn or dysphagia since he was discharged from the hospital.  He is not taking Ondansetron. He is taking  Reglan 10 mg twice daily.  He has intermittent epigastric pain which started 3 to 4 days prior to his hospitalization. He reported having intermittent epigastric pain for the past year. He also noted vomiting nonbloody 3-4 times over the past 2 years.  He smokes marijuana daily for the past 30 years.  He apparently stepped on a nail at work resulting in a left foot injury which required a course of 2 antibiotics when he reported taking Ibuprofen 800 mg once weekly for the past few months.  He recalled being constipated, no BM for 3 to 4 days prior to his hospital admission.  He typically passes a formed bowel movement every other day.  He infrequently sees a small amount of bright red blood on the toilet tissue if he strains to pass a bowel movement, last occurred 6 months ago.   He denies having any recent chest pain.  He was evaluated by cardiologist Dr. Sallyanne Kuster to her room 07/18/2022 due to having chest pain which was thought to be musculoskeletal and noncardiac.  Cardiac CT 09/21/2022 resulted in a coronary calcium score of 0.      Latest Ref Rng & Units 11/28/2022    6:24 AM 11/27/2022    3:44 AM 11/26/2022    2:55 PM  CBC  WBC 4.0 - 10.5 K/uL 10.4  13.4  14.1   Hemoglobin 13.0 - 17.0 g/dL 15.0  15.9  16.8   Hematocrit 39.0 - 52.0 % 46.4  48.0  51.0   Platelets  150 - 400 K/uL 297  288  292        Latest Ref Rng & Units 11/30/2022    5:39 AM 11/29/2022    7:06 AM 11/28/2022    6:24 AM  CMP  Glucose 70 - 99 mg/dL 154  184  244   BUN 6 - 20 mg/dL 7  9  11    Creatinine 0.61 - 1.24 mg/dL 0.65  0.70  0.68   Sodium 135 - 145 mmol/L 138  140  138   Potassium 3.5 - 5.1 mmol/L 3.1  3.3  3.1   Chloride 98 - 111 mmol/L 104  106  104   CO2 22 - 32 mmol/L 27  26  22    Calcium 8.9 - 10.3 mg/dL 8.5  8.6  8.7     Barium swallow study 11/28/2022: Small hiatal hernia.   No esophageal mucosal irregularity, stricture or mass.   No gastroesophageal reflux.  Gastric Empty Study  11/27/2022: FINDINGS: Expected location of the stomach in the left upper quadrant. Ingested meal empties the stomach gradually over the course of the study.   58% emptied at 1 hr ( normal >= 10%)   60% emptied at 2 hr ( normal >= 40%)   68% emptied at 3 hr ( normal >= 70%)   87% emptied at 4 hr ( normal >= 90%)   IMPRESSION: Slightly reduced gastric emptying suspicious for gastroparesis.  CTAP 11/22/2022:  FINDINGS: Lower chest: No acute abnormality.   Hepatobiliary: No focal liver abnormality is seen. No gallstones, gallbladder wall thickening, or biliary dilatation.   Pancreas: Unremarkable. No pancreatic ductal dilatation or surrounding inflammatory changes.   Spleen: Normal in size without focal abnormality.   Adrenals/Urinary Tract: Adrenal glands are unremarkable. Kidneys are normal, without renal calculi, focal lesion, or hydronephrosis. Bladder is unremarkable.   Stomach/Bowel: Stomach is within normal limits. Appendix appears normal. No evidence of bowel wall thickening, distention, or inflammatory changes.   Vascular/Lymphatic: No significant vascular findings are present. No enlarged abdominal or pelvic lymph nodes.   Reproductive: Prostate is unremarkable.   Other: No abdominal wall hernia or abnormality. No abdominopelvic ascites.   Musculoskeletal: No acute or significant osseous findings.   IMPRESSION: No acute abnormality seen in the abdomen or pelvis.  Past Medical History:  Diagnosis Date   Allergy    Seasonal--spring through fall.   Asthma childhood and adult   triggers are allergies and URI.  Spring, summer and fall.   Diabetes mellitus without complication ([place])    Morbid obesity ([place])    Peripheral edema 04/09/2017   Past Surgical History:  Procedure Laterality Date   INCISION AND DRAINAGE PERIRECTAL ABSCESS N/A 10/06/2015   Procedure: IRRIGATION AND DEBRIDEMENT PERIANAL  ABSCESS;  Surgeon: Alphonsa Overall, MD;  Location: [facility];   Service: General;  Laterality: N/A;   KNEE SURGERY     Right and Left Knee  2 right and 2 left knee surgeries   Social History: Married. He has one son and one daughter. He smokes 5 cigarettes daily and smokes marijuana daily x 30 years. No other drug use. He drinks twice every three months.   Family History: Mother with history of ovarian cancer. Mother, father and brother with diabetes. Father with heart disease, had quadruple CABG surgery.   No Known Allergies    Outpatient Encounter Medications as of 12/27/2022  Medication Sig   Blood Glucose Monitoring Suppl (ACCU-CHEK AVIVA PLUS) w/Device KIT Check blood sugar before meals 3 times daily  insulin NPH-regular Human (NOVOLIN 70/30) (70-30) 100 UNIT/ML injection Inject 52 Units into the skin 2 (two) times daily with a meal. 52 units subcutaneously twice daily before meals (Patient taking differently: Inject 70 Units into the skin in the morning and at bedtime.)   Insulin Syringe-Needle U-100 (INSULIN SYRINGE .5CC/31GX5/16") 31G X 5/16" 0.5 ML MISC 1 Device by Does not apply route 2 (two) times daily with a meal.   metoCLOPramide (REGLAN) 10 MG tablet Take 1 tablet (10 mg total) by mouth 3 (three) times daily with meals.   ondansetron (ZOFRAN) 4 MG tablet Take 1 tablet (4 mg total) by mouth every 8 (eight) hours as needed for nausea or vomiting.   pantoprazole (PROTONIX) 40 MG tablet Take 1 tablet (40 mg total) by mouth 2 (two) times daily before a meal.   No facility-administered encounter medications on file as of 12/27/2022.    REVIEW OF SYSTEMS:  Gen: Denies fever, sweats or chills. No weight loss.  CV: + Leg swelling. Denies chest pain or palpitations.  Resp: Denies cough, shortness of breath of hemoptysis.  GI: See HPI. GU : Denies urinary burning, blood in urine, increased urinary frequency or incontinence. MS: Denies joint pain, muscles aches or weakness. Derm: Denies rash, itchiness, skin lesions or unhealing ulcers. Psych:  Denies depression, anxiety, memory loss or confusion. Heme: Denies bruising, easy bleeding. Neuro:  Denies headaches, dizziness or paresthesias. Endo:  + Diabetes.   PHYSICAL EXAM: BP 115/80   Pulse 88   Ht 6\' 7"  (2.007 m)   Wt (!) 342 lb (155.1 kg)   BMI 38.53 kg/m   General: 43 year old male in no acute distress. Head: Normocephalic and atraumatic. Eyes:  Sclerae non-icteric, conjunctive pink. Ears: Normal auditory acuity. Mouth: Dentition intact. No ulcers or lesions.  Neck: Supple, no lymphadenopathy or thyromegaly.  Lungs: Clear bilaterally to auscultation without wheezes, crackles or rhonchi. Heart: Regular rate and rhythm. No murmur, rub or gallop appreciated.  Abdomen: Soft, nontender, nondistended. No masses. No hepatosplenomegaly. Normoactive bowel sounds x 4 quadrants.  Rectal: Deferred. Musculoskeletal: Symmetrical with no gross deformities. Skin: Warm and dry. No rash or lesions on visible extremities. Extremities: No edema. Neurological: Alert oriented x 4, no focal deficits.  Psychological:  Alert and cooperative. Normal mood and affect.  ASSESSMENT AND PLAN:  42 year old male admitted to the hospital 2/19 - 11/30/2022 with N/V x 5 days prior to admission who subsequently developed heartburn and dysphagia in the setting of poorly controlled diabetes and daily marijuana use x 30 years. Prior to admission, patient had intermittent epigastric pain with 3-4 episodes of nausea/vomiting over the past 2 years.  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 CTAP 11/22/2022 did not identify any acute intra-abdominal/pelvic pathology to explain his symptoms. Barium swallow study 11/28/2022 showed a small hiatal hernia without evidence of reflux, esophageal stricture esophageal or dysmotility. A gastric empty study (done while he was receiving antiemetic and pain meds ) showed slightly reduced gastric emptying suspicious for gastroparesis. +  NSAID use.  -Reduce Pantoprazole to 40 mg once daily -Reduce Reglan to 10 mg daily and monitor symptoms, eventually wean off if tolerated.  Patient will restart Reglan 2-3 times daily if his nausea and vomiting recur, however, I discussed the risk of tardive dyskinesia associated with Reglan use. -Patient was encouraged to decrease marijuana use with the eventual goal of no marijuana.  I explained to the patient that chronic marijuana use can result in cyclic nausea vomiting syndrome. -Recommend eating 3-4  small snacks sized meals -Consider repeat gastric emptying study to rule out gastroparesis when patient is off Reglan for 2 to 4 weeks -Avoid ibuprofen and other NSAIDs -EGD to rule out PUD/GERD or other upper GI normality to explain his symptoms, benefits and risks discussed including risk with sedation, risk of bleeding, perforation and infection   Constipation -MiraLAX nightly  Infrequent rectal bleeding which occurs after straining to pass a BM -Patient to monitor symptoms, contact office if rectal bleeding recurs  Poorly controlled diabetes, on insulin. HgA1c 12.9. -Recommended endocrinology follow-up  CC:  Mack Hook, MD

## 2022-12-27 NOTE — Patient Instructions (Addendum)
Decrease marijuana use with eventual goal of no marijuana.   Reduce Pantoprazole 40mg  one capsule to be taken 30 minutes before breakfast   Reduce Reglan 10mg  one tab to once daily before breakfast or dinner  Recommend eating 3 to 4 small snack sized meals   Further follow up to be determined after upper endoscopy completed  Follow up with your endocrinologist to better control your glucose levels   Contact our office if your nausea and vomiting recurs and if your upper abdominal pain worsens   You have been scheduled for an endoscopy. Please follow written instructions given to you at your visit today. If you use inhalers (even only as needed), please bring them with you on the day of your procedure.  Due to recent changes in healthcare laws, you may see the results of your imaging and laboratory studies on MyChart before your provider has had a chance to review them.  We understand that in some cases there may be results that are confusing or concerning to you. Not all laboratory results come back in the same time frame and the provider may be waiting for multiple results in order to interpret others.  Please give [place] 48 hours in order for your provider to thoroughly review all the results before contacting the office for clarification of your results.   Thank you for trusting me with your gastrointestinal care!   Carl Best, CRNP

## 2022-12-29 NOTE — Progress Notes (Signed)
Agree with the assessment and plan as outlined by Carl Best, NP.   Acute symptoms may have also been related to severe hyperglycemia, which can cause clinical picture similar to gastroparesis.  Patient's priority should be glycemic control.  Agree with marijuana cessation as well.  EGD indicated to exclude luminal abnormalities/PUD.   Kamorah Nevils E. Candis Schatz, MD [facility]

## 2023-01-08 ENCOUNTER — Ambulatory Visit (AMBULATORY_SURGERY_CENTER): Payer: BC Managed Care – PPO | Admitting: Gastroenterology

## 2023-01-08 ENCOUNTER — Encounter: Payer: Self-pay | Admitting: Gastroenterology

## 2023-01-08 VITALS — BP 120/77 | HR 70 | Temp 98.9°F | Resp 15 | Ht 79.0 in | Wt 342.0 lb

## 2023-01-08 DIAGNOSIS — R112 Nausea with vomiting, unspecified: Secondary | ICD-10-CM

## 2023-01-08 DIAGNOSIS — K319 Disease of stomach and duodenum, unspecified: Secondary | ICD-10-CM | POA: Diagnosis not present

## 2023-01-08 HISTORY — PX: UPPER GASTROINTESTINAL ENDOSCOPY: SHX188

## 2023-01-08 MED ORDER — SODIUM CHLORIDE 0.9 % IV SOLN: 500.0000 mL | Freq: Once | INTRAVENOUS | Status: DC

## 2023-01-08 NOTE — Op Note (Signed)
[name]: Shawn Benson Procedure Date: 01/08/2023 11:32 AM MRN: [id number] Endoscopist: Nicki Reaper E. Candis Schatz , MD, [id number] Age: 42 Referring MD:  Date of Birth: 04-10-1981 Gender: Male Account #: [id number] Procedure:                Upper GI endoscopy Indications:              Nausea with vomiting Medicines:                Monitored Anesthesia Care Procedure:                Pre-Anesthesia Assessment:                           - Prior to the procedure, a History and Physical                            was performed, and patient medications and                            allergies were reviewed. The patient's tolerance of                            previous anesthesia was also reviewed. The risks                            and benefits of the procedure and the sedation                            options and risks were discussed with the patient.                            All questions were answered, and informed consent                            was obtained. Prior Anticoagulants: The patient has                            taken no anticoagulant or antiplatelet agents. ASA                            Grade Assessment: III - A patient with severe                            systemic disease. After reviewing the risks and                            benefits, the patient was deemed in satisfactory                            condition to undergo the procedure.                           After obtaining informed consent, the endoscope was  passed under direct vision. Throughout the                            procedure, the patient's blood pressure, pulse, and                            oxygen saturations were monitored continuously. The                            Olympus Scope [id number] was introduced through the                            mouth, and advanced to the third part of duodenum.                            The upper GI endoscopy  was accomplished without                            difficulty. The patient tolerated the procedure                            well. Scope In: Scope Out: Findings:                 The examined portions of the nasopharynx,                            oropharynx and larynx were normal.                           The examined esophagus was normal.                           The entire examined stomach was normal. Biopsies                            were taken with a cold forceps for Helicobacter                            pylori testing. Estimated blood loss was minimal.                           The examined duodenum was normal. Complications:            No immediate complications. Estimated Blood Loss:     Estimated blood loss was minimal. Impression:               - The examined portions of the nasopharynx,                            oropharynx and larynx were normal.                           - Normal esophagus.                           - Normal  stomach. Biopsied.                           - Normal examined duodenum.                           - Patient's symptoms may have been related to                            hyperglycemia vs cannibis hyperemesis syndrome. No                            evidence of peptic ulcer disease or gastritis. Recommendation:           - Patient has a contact number available for                            emergencies. The signs and symptoms of potential                            delayed complications were discussed with the                            patient. Return to normal activities tomorrow.                            Written discharge instructions were provided to the                            patient.                           - Resume previous diet.                           - Continue present medications.                           - Await pathology results.                           - Recommend focusing on glycemic control and                             marijuana cessation to improve nausea/vomiting                            symptoms. Norwood Quezada E. Candis Schatz, MD 01/08/2023 12:07:32 PM This report has been signed electronically.

## 2023-01-08 NOTE — Patient Instructions (Addendum)
- Resume previous diet.                           - Continue present medications.                           - Await pathology results.                           - Recommend focusing on glycemic control and                            marijuana cessation to improve nausea/vomiting                            symptoms.  YOU HAD AN ENDOSCOPIC PROCEDURE TODAY AT [facility]:   Refer to the procedure report that was given to you for any specific questions about what was found during the examination.  If the procedure report does not answer your questions, please call your gastroenterologist to clarify.  If you requested that your care partner not be given the details of your procedure findings, then the procedure report has been included in a sealed envelope for you to review at your convenience later.  YOU SHOULD EXPECT: Some feelings of bloating in the abdomen. Passage of more gas than usual.  Walking can help get rid of the air that was put into your GI tract during the procedure and reduce the bloating. If you had a lower endoscopy (such as a colonoscopy or flexible sigmoidoscopy) you may notice spotting of blood in your stool or on the toilet paper. If you underwent a bowel prep for your procedure, you may not have a normal bowel movement for a few days.  Please Note:  You might notice some irritation and congestion in your nose or some drainage.  This is from the oxygen used during your procedure.  There is no need for concern and it should clear up in a day or so.  SYMPTOMS TO REPORT IMMEDIATELY:   Following upper endoscopy (EGD)  Vomiting of blood or coffee ground material  New chest pain or pain under the shoulder blades  Painful or persistently difficult swallowing  New shortness of breath  Fever of 100F or higher  Black, tarry-looking stools  For urgent or emergent issues, a gastroenterologist can be reached at any hour by calling [phone]  [phone]. Do not use MyChart messaging for urgent concerns.    DIET:  We do recommend a small meal at first, but then you may proceed to your regular diet.  Drink plenty of fluids but you should avoid alcoholic beverages for 24 hours.  ACTIVITY:  You should plan to take it easy for the rest of today and you should NOT DRIVE or use heavy machinery until tomorrow (because of the sedation medicines used during the test).    FOLLOW UP: Our staff will call the number listed on your records the next business day following your procedure.  We will call around 7:15- 8:00 am to check on you and address any questions or concerns that you may have regarding the information given to you following your procedure. If we do not reach you, we will leave a message.     If any biopsies were taken you will be  contacted by phone or by letter within the next 1-3 weeks.  Please call us at [phone] if you have not heard about the biopsies in 3 weeks.    SIGNATURES/CONFIDENTIALITY: You and/or your care partner have signed paperwork which will be entered into your electronic medical record.  These signatures attest to the fact that that the information above on your After Visit Summary has been reviewed and is understood.  Full responsibility of the confidentiality of this discharge information lies with you and/or your care-partner.

## 2023-01-08 NOTE — Progress Notes (Signed)
Called to room to assist during endoscopic procedure.  Patient ID and intended procedure confirmed with present staff. Received instructions for my participation in the procedure from the performing physician.  

## 2023-01-08 NOTE — Progress Notes (Signed)
History and Physical Interval Note:  01/08/2023 11:39 AM  Shawn Benson  has presented today for endoscopic procedure(s), with the diagnosis of  Encounter Diagnosis  Name Primary?   Nausea and vomiting, unspecified vomiting type Yes  .  The various methods of evaluation and treatment have been discussed with the patient and/or family. After consideration of risks, benefits and other options for treatment, the patient has consented to  the endoscopic procedure(s).   The patient's history has been reviewed, patient examined, no change in status, stable for endoscopic procedure(s).  I have reviewed the patient's chart and labs.  Questions were answered to the patient's satisfaction.     Brehanna Deveny E. Candis Schatz, MD [facility]

## 2023-01-08 NOTE — Progress Notes (Signed)
A and O x3. Report to RN. Tolerated MAC anesthesia well.Teeth unchanged after procedure. 

## 2023-01-08 NOTE — Progress Notes (Signed)
Pt's states no medical or surgical changes since previsit or office visit. 

## 2023-01-09 ENCOUNTER — Telehealth: Payer: Self-pay

## 2023-01-09 NOTE — Telephone Encounter (Signed)
  Follow up Call-     01/08/2023   10:54 AM  Call back number  Post procedure Call Back phone  # [phone]  Permission to leave phone message Yes     Patient questions:  Do you have a fever, pain , or abdominal swelling? No. Pain Score  0 *  Have you tolerated food without any problems? Yes.    Have you been able to return to your normal activities? Yes.    Do you have any questions about your discharge instructions: Diet   No. Medications  No. Follow up visit  No.  Do you have questions or concerns about your Care? No.  Actions: * If pain score is 4 or above: No action needed, pain <4.

## 2023-01-13 NOTE — Progress Notes (Signed)
[place],  The biopsies taken from your stomach were notable for mild reactive gastropathy which is a common finding and often related to use of certain medications (usually NSAIDs), but there was no evidence of Helicobacter pylori infection. This common finding is not felt to necessarily be a cause of any particular symptom and there is no specific treatment or further evaluation recommended.

## 2023-01-18 DIAGNOSIS — E1165 Type 2 diabetes mellitus with hyperglycemia: Secondary | ICD-10-CM | POA: Diagnosis not present

## 2023-01-21 ENCOUNTER — Ambulatory Visit (INDEPENDENT_AMBULATORY_CARE_PROVIDER_SITE_OTHER): Payer: BC Managed Care – PPO | Admitting: Podiatry

## 2023-01-21 ENCOUNTER — Ambulatory Visit: Payer: BC Managed Care – PPO | Admitting: Registered"

## 2023-01-21 ENCOUNTER — Encounter: Payer: Self-pay | Admitting: Podiatry

## 2023-01-21 VITALS — BP 136/84

## 2023-01-21 DIAGNOSIS — B351 Tinea unguium: Secondary | ICD-10-CM | POA: Diagnosis not present

## 2023-01-21 DIAGNOSIS — M2142 Flat foot [pes planus] (acquired), left foot: Secondary | ICD-10-CM | POA: Diagnosis not present

## 2023-01-21 DIAGNOSIS — E1142 Type 2 diabetes mellitus with diabetic polyneuropathy: Secondary | ICD-10-CM | POA: Diagnosis not present

## 2023-01-21 DIAGNOSIS — M79675 Pain in left toe(s): Secondary | ICD-10-CM | POA: Diagnosis not present

## 2023-01-21 DIAGNOSIS — M79674 Pain in right toe(s): Secondary | ICD-10-CM | POA: Diagnosis not present

## 2023-01-21 NOTE — Progress Notes (Unsigned)
  Subjective:  Patient ID: Shawn Benson, male    DOB: [birth year],  MRN: [id number]  Shawn Benson presents to clinic today for {jgcomplaint:23593}  Chief Complaint  Patient presents with   Nail Problem    [facility] BS-did not check today A1C-12.0 PCP-[facility] PCP VST-01/18/2023   New problem(s): None. {jgcomplaint:23593}  PCP is Carin Hock, [place].  No Known Allergies  Review of Systems: Negative except as noted in the HPI.  Objective: No changes noted in today's physical examination. Vitals:   01/21/23 1557  BP: 136/84   Shawn Benson is a pleasant 42 y.o. male {jgbodyhabitus:24098} AAO x 3.  Vascular Examination: CFT <3 seconds b/l. DP /PT pulses 1/4 bilaterally. Digital hair absent. Skin temperature gradient warm to warm b/l. No ischemia or gangrene. No cyanosis or clubbing noted b/l. No pain with calf compression. Trace edema noted BLE. Varicosities present b/l.   Neurological Examination: Pt has subjective symptoms of neuropathy. Protective sensation decreased with 10 gram monofilament b/l. Vibratory sensation diminished b/l.  Dermatological Examination: Pedal skin is warm and supple b/l LE. No open wounds b/l LE. No interdigital macerations noted b/l LE.   Toenails 1-5 b/l elongated, discolored, dystrophic, thickened, crumbly with subungual debris and tenderness to dorsal palpation.   No hyperkeratotic nor porokeratotic lesions present on today's visit.  Musculoskeletal Examination: Muscle strength 5/5 to b/l LE. Muscle strength 5/5 to all lower extremity muscle groups bilaterally. Pes planus deformity noted left lower extremity. Pes planovalgus deformity noted right lower extremity. Patient ambulates independent of any assistive aids.  Radiographs: None  Assessment/Plan: No diagnosis found.  No orders of the defined types were placed in this encounter.   None {Jgplan:23602::"-Patient/POA to call should there be question/concern in the interim."}   Return  in about 3 months (around 04/22/2023).  Freddie Breech, DPM

## 2023-01-22 DIAGNOSIS — E1165 Type 2 diabetes mellitus with hyperglycemia: Secondary | ICD-10-CM | POA: Diagnosis not present

## 2023-01-22 DIAGNOSIS — Z6841 Body Mass Index (BMI) 40.0 and over, adult: Secondary | ICD-10-CM | POA: Diagnosis not present

## 2023-01-28 ENCOUNTER — Other Ambulatory Visit: Payer: Self-pay

## 2023-01-28 ENCOUNTER — Encounter (HOSPITAL_COMMUNITY): Payer: Self-pay

## 2023-01-28 ENCOUNTER — Emergency Department (HOSPITAL_COMMUNITY)
Admission: EM | Admit: 2023-01-28 | Discharge: 2023-01-29 | Disposition: A | Discharge status: Home / Self Care | Payer: BC Managed Care – PPO | Attending: Emergency Medicine | Admitting: Emergency Medicine

## 2023-01-28 DIAGNOSIS — R739 Hyperglycemia, unspecified: Secondary | ICD-10-CM | POA: Diagnosis not present

## 2023-01-28 DIAGNOSIS — R112 Nausea with vomiting, unspecified: Secondary | ICD-10-CM | POA: Diagnosis not present

## 2023-01-28 DIAGNOSIS — E1165 Type 2 diabetes mellitus with hyperglycemia: Secondary | ICD-10-CM | POA: Insufficient documentation

## 2023-01-28 DIAGNOSIS — I10 Essential (primary) hypertension: Secondary | ICD-10-CM | POA: Diagnosis not present

## 2023-01-28 DIAGNOSIS — Z794 Long term (current) use of insulin: Secondary | ICD-10-CM | POA: Insufficient documentation

## 2023-01-28 DIAGNOSIS — E669 Obesity, unspecified: Secondary | ICD-10-CM | POA: Insufficient documentation

## 2023-01-28 DIAGNOSIS — R1084 Generalized abdominal pain: Secondary | ICD-10-CM | POA: Diagnosis not present

## 2023-01-28 DIAGNOSIS — Z6838 Body mass index (BMI) 38.0-38.9, adult: Secondary | ICD-10-CM | POA: Diagnosis not present

## 2023-01-28 DIAGNOSIS — J45909 Unspecified asthma, uncomplicated: Secondary | ICD-10-CM | POA: Insufficient documentation

## 2023-01-28 DIAGNOSIS — E860 Dehydration: Secondary | ICD-10-CM | POA: Diagnosis not present

## 2023-01-28 LAB — COMPREHENSIVE METABOLIC PANEL
ALT: 14 U/L (ref 0–44)
AST: 15 U/L (ref 15–41)
Albumin: 4.1 g/dL (ref 3.5–5.0)
Alkaline Phosphatase: 102 U/L (ref 38–126)
Anion gap: 14 (ref 5–15)
BUN: 14 mg/dL (ref 6–20)
CO2: 25 mmol/L (ref 22–32)
Calcium: 9.4 mg/dL (ref 8.9–10.3)
Chloride: 99 mmol/L (ref 98–111)
Creatinine, Ser: 0.85 mg/dL (ref 0.61–1.24)
GFR, Estimated: >60 mL/min (ref >60)
Glucose, Bld: 426 mg/dL — ABNORMAL HIGH (ref 70–99)
Potassium: 3.7 mmol/L (ref 3.5–5.1)
Sodium: 138 mmol/L (ref 135–145)
Total Bilirubin: 0.6 mg/dL (ref 0.3–1.2)
Total Protein: 8.5 g/dL — ABNORMAL HIGH (ref 6.5–8.1)

## 2023-01-28 LAB — CBC WITH DIFFERENTIAL/PLATELET
Abs Immature Granulocytes: 0.05 10*3/uL (ref 0.00–0.07)
Basophils Absolute: 0 10*3/uL (ref 0.0–0.1)
Basophils Relative: 0 %
Eosinophils Absolute: 0 10*3/uL (ref 0.0–0.5)
Eosinophils Relative: 0 %
HCT: 45.5 % (ref 39.0–52.0)
Hemoglobin: 14.6 g/dL (ref 13.0–17.0)
Immature Granulocytes: 0 %
Lymphocytes Relative: 8 %
Lymphs Abs: 1 10*3/uL (ref 0.7–4.0)
MCH: 26 pg (ref 26.0–34.0)
MCHC: 32.1 g/dL (ref 30.0–36.0)
MCV: 81 fL (ref 80.0–100.0)
Monocytes Absolute: 0.3 10*3/uL (ref 0.1–1.0)
Monocytes Relative: 3 %
Neutro Abs: 11.5 10*3/uL — ABNORMAL HIGH (ref 1.7–7.7)
Neutrophils Relative %: 89 %
Platelets: 332 10*3/uL (ref 150–400)
RBC: 5.62 MIL/uL (ref 4.22–5.81)
RDW: 14 % (ref 11.5–15.5)
WBC: 12.9 10*3/uL — ABNORMAL HIGH (ref 4.0–10.5)
nRBC: 0 % (ref 0.0–0.2)

## 2023-01-28 LAB — CBG MONITORING, ED: Glucose-Capillary: 415 mg/dL — ABNORMAL HIGH (ref 70–99)

## 2023-01-28 LAB — LIPASE, BLOOD: Lipase: 22 U/L (ref 11–51)

## 2023-01-28 MED ORDER — SODIUM CHLORIDE 0.9 % IV BOLUS
1000.0000 mL | Freq: Once | INTRAVENOUS | Status: AC
  Administered 2023-01-28: 1000 mL via INTRAVENOUS

## 2023-01-28 MED ORDER — PANTOPRAZOLE SODIUM 40 MG IV SOLR
40.0000 mg | Freq: Once | INTRAVENOUS | Status: AC
  Administered 2023-01-28: 40 mg via INTRAVENOUS
  Filled 2023-01-28: qty 10

## 2023-01-28 MED ORDER — FAMOTIDINE IN NACL 20-0.9 MG/50ML-% IV SOLN
20.0000 mg | INTRAVENOUS | Status: AC
  Administered 2023-01-28: 20 mg via INTRAVENOUS
  Filled 2023-01-28: qty 50

## 2023-01-28 MED ORDER — KETOROLAC TROMETHAMINE 15 MG/ML IJ SOLN
15.0000 mg | Freq: Once | INTRAMUSCULAR | Status: AC
  Administered 2023-01-28: 15 mg via INTRAVENOUS
  Filled 2023-01-28: qty 1

## 2023-01-28 MED ORDER — METOCLOPRAMIDE HCL 5 MG/ML IJ SOLN
10.0000 mg | INTRAMUSCULAR | Status: AC
  Administered 2023-01-28: 10 mg via INTRAVENOUS
  Filled 2023-01-28: qty 2

## 2023-01-28 NOTE — ED Triage Notes (Signed)
Patient c/o N/V/D since 0800 today. Patient has taken PO zofran and OTC meds without relief.

## 2023-01-28 NOTE — ED Notes (Signed)
Patient given urinal for UA specimen

## 2023-01-28 NOTE — ED Provider Notes (Signed)
[place] EM[facility]GENCY DEPARTMENT AT [facility] Provider Note   CSN: [id number] Arrival date & time: 01/28/23  2157     History {Add pertinent medical, surgical, social history, OB history to HPI:1} Chief Complaint  Patient presents with   Emesis   Nausea   Diarrhea    Shawn Benson is a 42 y.o. male.  42 year old male with a history of diabetes, diabetic gastroparesis, morbid obesity, asthma presents to the emergency department for evaluation of nausea, vomiting, diarrhea.  Symptoms began at 8 AM today and have remained constant.  He reports more than 15 episodes of emesis which have not improved with ODT Zofran.  Symptoms associated with generalized abdominal discomfort.  He underwent endoscopy by GI on 01/08/2023 which was unremarkable.  Denies fever, hematemesis, melena, hematochezia, urinary symptoms.  He has not taken his insulin today.  No prior abdominal surgeries.  The history is provided by the patient. No language interpreter was used.  Emesis Associated symptoms: diarrhea   Diarrhea Associated symptoms: vomiting        Home Medications Prior to Admission medications   Medication Sig Start Date End Date Taking? Authorizing Provider  Blood Glucose Monitoring Suppl (ACCU-CHEK AVIVA PLUS) w/Device KIT Check blood sugar before meals 3 times daily 09/16/20   Julieanne Manson, MD  clotrimazole [facility]) 10 MG troche SMARTSIG:1 Lozenge(s) By Mouth Twice Daily Patient not taking: Reported on 01/08/2023 12/11/22   [provider]  furosemide (LASIX) 20 MG tablet Take 20 mg by mouth daily. Patient not taking: Reported on 01/08/2023 12/11/22   [provider]  gabapentin (NEURONTIN) 300 MG capsule  12/11/22   [provider]  insulin NPH-regular Human (NOVOLIN 70/30) (70-30) 100 UNIT/ML injection Inject 52 Units into the skin 2 (two) times daily with a meal. 52 units subcutaneously twice daily before meals Patient taking differently: Inject 60  Units into the skin in the morning and at bedtime. 07/10/16   Julieanne Manson, MD  Insulin Syringe-Needle U-100 (INSULIN SYRINGE .5CC/31GX5/16") 31G X 5/16" 0.5 ML MISC 1 Device by Does not apply route 2 (two) times daily with a meal. 10/20/16   Julieanne Manson, MD  KLOR-CON 20 MEQ packet Take 20 mEq by mouth daily. Patient not taking: Reported on 01/08/2023 12/11/22   [provider]  metoCLOPramide (REGLAN) 10 MG tablet Take 1 tablet (10 mg total) by mouth 3 (three) times daily with meals. 11/30/22 02/28/23  Noralee Stain, DO  pantoprazole (PROTONIX) 40 MG tablet Take 1 tablet (40 mg total) by mouth 2 (two) times daily before a meal. 11/30/22 02/28/23  Noralee Stain, DO      Allergies    Patient has no known allergies.    Review of Systems   Review of Systems  Gastrointestinal:  Positive for diarrhea and vomiting.  Ten systems reviewed and are negative for acute change, except as noted in the HPI.    Physical Exam Updated Vital Signs BP (!) 160/61 (BP Location: Right Arm)   Pulse 61   Temp 98.3 F (36.8 C) (Oral)   Resp 15   Ht  (2.007 m)   Wt (!) 156.9 kg   SpO2 97%   BMI 38.98 kg/m   Physical Exam Vitals and nursing note reviewed.  Constitutional:      General: He is not in acute distress.    Appearance: He is well-developed. He is not diaphoretic.     Comments: Morbidly obese male. Slightly pale appearing.  HENT:     Head:  Normocephalic and atraumatic.  Eyes:     General: No scleral icterus.    Conjunctiva/sclera: Conjunctivae normal.  Cardiovascular:     Rate and Rhythm: Normal rate and regular rhythm.     Pulses: Normal pulses.  Pulmonary:     Effort: Pulmonary effort is normal. No respiratory distress.     Breath sounds: No stridor. No wheezing.     Comments: Respirations even and unlabored Abdominal:     Palpations: Abdomen is soft.     Tenderness: There is no guarding.     Comments: Abdominal exam limited secondary to habitus.  There is no  focal abdominal tenderness or peritoneal signs.  Musculoskeletal:        General: Normal range of motion.     Cervical back: Normal range of motion.  Skin:    General: Skin is warm and dry.     Coloration: Skin is not pale.     Findings: No erythema or rash.  Neurological:     Mental Status: He is alert and oriented to person, place, and time.     Coordination: Coordination normal.  Psychiatric:        Behavior: Behavior normal.     ED Results / Procedures / Treatments   Labs (all labs ordered are listed, but only abnormal results are displayed) Labs Reviewed  CBC WITH DIFFERENTIAL/PLATELET - Abnormal; Notable for the following components:      Result Value   WBC 12.9 (*)    Neutro Abs 11.5 (*)    All other components within normal limits  CBG MONITORING, ED - Abnormal; Notable for the following components:   Glucose-Capillary 415 (*)    All other components within normal limits  COMPREHENSIVE METABOLIC PANEL  LIPASE, BLOOD  URINALYSIS, ROUTINE W REFLEX MICROSCOPIC    EKG None  Radiology No results found.  Procedures Procedures  {Document cardiac monitor, telemetry assessment procedure when appropriate:1}  Medications Ordered in ED Medications  famotidine (PEPCID) IVPB 20 mg premix (20 mg Intravenous New Bag/Given 01/28/23 2317)  sodium chloride 0.9 % bolus 1,000 mL (1,000 mLs Intravenous New Bag/Given 01/28/23 2306)  pantoprazole (PROTONIX) injection 40 mg (40 mg Intravenous Given 01/28/23 2311)  metoCLOPramide (REGLAN) injection 10 mg (10 mg Intravenous Given 01/28/23 2315)  ketorolac (TORADOL) 15 MG/ML injection 15 mg (15 mg Intravenous Given 01/28/23 2313)    ED Course/ Medical Decision Making/ A&P   {   Click here for ABCD2, HEART and other calculatorsREFRESH Note before signing :1}                          Medical Decision Making Amount and/or Complexity of Data Reviewed Labs: ordered.  Risk Prescription drug management.   ***  {Document critical  care time when appropriate:1} {Document review of labs and clinical decision tools ie heart score, Chads2Vasc2 etc:1}  {Document your independent review of radiology images, and any outside records:1} {Document your discussion with family members, caretakers, and with consultants:1} {Document social determinants of health affecting pt's care:1} {Document your decision making why or why not admission, treatments were needed:1} Final Clinical Impression(s) / ED Diagnoses Final diagnoses:  None    Rx / DC Orders ED Discharge Orders     None

## 2023-01-29 ENCOUNTER — Telehealth: Payer: Self-pay | Admitting: Podiatry

## 2023-01-29 LAB — URINALYSIS, ROUTINE W REFLEX MICROSCOPIC
Bacteria, UA: NONE SEEN
Bilirubin Urine: NEGATIVE
Glucose, UA: >=500 mg/dL — AB
Hgb urine dipstick: NEGATIVE
Ketones, ur: 80 mg/dL — AB
Leukocytes,Ua: NEGATIVE
Nitrite: NEGATIVE
Protein, ur: NEGATIVE mg/dL
Specific Gravity, Urine: 1.037 — ABNORMAL HIGH (ref 1.005–1.030)
pH: 5 (ref 5.0–8.0)

## 2023-01-29 LAB — CBG MONITORING, ED: Glucose-Capillary: 352 mg/dL — ABNORMAL HIGH (ref 70–99)

## 2023-01-29 MED ORDER — ONDANSETRON HCL 4 MG/2ML IJ SOLN
4.0000 mg | Freq: Once | INTRAMUSCULAR | Status: AC
  Administered 2023-01-29: 4 mg via INTRAVENOUS
  Filled 2023-01-29: qty 2

## 2023-01-29 NOTE — Discharge Instructions (Signed)
Continue follow-up with your primary care doctor.  Continue your prescribed Protonix and Reglan for management of your gastroparesis symptoms.  Return for new or concerning symptoms.

## 2023-01-29 NOTE — ED Notes (Signed)
Patient tolerated water PO challenge and ginger ale PO

## 2023-01-29 NOTE — Telephone Encounter (Signed)
Dr. Logan Bores just checking on the the form R 25 for the patient that was left in your folder downstairs?

## 2023-01-30 ENCOUNTER — Other Ambulatory Visit: Payer: Self-pay

## 2023-01-30 ENCOUNTER — Encounter (HOSPITAL_COMMUNITY): Payer: Self-pay

## 2023-01-30 ENCOUNTER — Emergency Department (HOSPITAL_COMMUNITY)
Admission: EM | Admit: 2023-01-30 | Discharge: 2023-01-30 | Disposition: A | Discharge status: Home / Self Care | Payer: BC Managed Care – PPO | Attending: Emergency Medicine | Admitting: Emergency Medicine

## 2023-01-30 DIAGNOSIS — F1290 Cannabis use, unspecified, uncomplicated: Secondary | ICD-10-CM | POA: Diagnosis not present

## 2023-01-30 DIAGNOSIS — E1165 Type 2 diabetes mellitus with hyperglycemia: Secondary | ICD-10-CM | POA: Diagnosis not present

## 2023-01-30 DIAGNOSIS — R739 Hyperglycemia, unspecified: Secondary | ICD-10-CM | POA: Diagnosis not present

## 2023-01-30 DIAGNOSIS — R1084 Generalized abdominal pain: Secondary | ICD-10-CM | POA: Diagnosis not present

## 2023-01-30 DIAGNOSIS — R112 Nausea with vomiting, unspecified: Secondary | ICD-10-CM | POA: Diagnosis not present

## 2023-01-30 LAB — COMPREHENSIVE METABOLIC PANEL
ALT: 14 U/L (ref 0–44)
AST: 13 U/L — ABNORMAL LOW (ref 15–41)
Albumin: 4 g/dL (ref 3.5–5.0)
Alkaline Phosphatase: 92 U/L (ref 38–126)
Anion gap: 14 (ref 5–15)
BUN: 17 mg/dL (ref 6–20)
CO2: 23 mmol/L (ref 22–32)
Calcium: 9.1 mg/dL (ref 8.9–10.3)
Chloride: 99 mmol/L (ref 98–111)
Creatinine, Ser: 0.85 mg/dL (ref 0.61–1.24)
GFR, Estimated: >60 mL/min (ref >60)
Glucose, Bld: 479 mg/dL — ABNORMAL HIGH (ref 70–99)
Potassium: 3.5 mmol/L (ref 3.5–5.1)
Sodium: 136 mmol/L (ref 135–145)
Total Bilirubin: 1 mg/dL (ref 0.3–1.2)
Total Protein: 7.8 g/dL (ref 6.5–8.1)

## 2023-01-30 LAB — CBC
HCT: 45.3 % (ref 39.0–52.0)
Hemoglobin: 14.5 g/dL (ref 13.0–17.0)
MCH: 26.1 pg (ref 26.0–34.0)
MCHC: 32 g/dL (ref 30.0–36.0)
MCV: 81.5 fL (ref 80.0–100.0)
Platelets: 305 10*3/uL (ref 150–400)
RBC: 5.56 MIL/uL (ref 4.22–5.81)
RDW: 14 % (ref 11.5–15.5)
WBC: 11.5 10*3/uL — ABNORMAL HIGH (ref 4.0–10.5)
nRBC: 0 % (ref 0.0–0.2)

## 2023-01-30 LAB — URINALYSIS, ROUTINE W REFLEX MICROSCOPIC
Bacteria, UA: NONE SEEN
Bilirubin Urine: NEGATIVE
Glucose, UA: >=500 mg/dL — AB
Hgb urine dipstick: NEGATIVE
Ketones, ur: 20 mg/dL — AB
Leukocytes,Ua: NEGATIVE
Nitrite: NEGATIVE
Protein, ur: NEGATIVE mg/dL
Specific Gravity, Urine: 1.031 — ABNORMAL HIGH (ref 1.005–1.030)
pH: 5 (ref 5.0–8.0)

## 2023-01-30 LAB — CBG MONITORING, ED: Glucose-Capillary: 497 mg/dL — ABNORMAL HIGH (ref 70–99)

## 2023-01-30 LAB — LIPASE, BLOOD: Lipase: 25 U/L (ref 11–51)

## 2023-01-30 MED ORDER — DROPERIDOL 2.5 MG/ML IJ SOLN
1.2500 mg | Freq: Once | INTRAMUSCULAR | Status: AC
  Administered 2023-01-30: 1.25 mg via INTRAVENOUS
  Filled 2023-01-30: qty 2

## 2023-01-30 MED ORDER — LACTATED RINGERS IV BOLUS
1000.0000 mL | Freq: Once | INTRAVENOUS | Status: AC
  Administered 2023-01-30: 1000 mL via INTRAVENOUS

## 2023-01-30 NOTE — Discharge Instructions (Signed)
Today's evaluation has been generally reassuring, and there is some suspicion that your abdominal pain may be related to your recent marijuana use.  Your tests in regards to your diabetes are somewhat reassuring, though your blood sugar is slightly high. Please be sure to take all medication as directed and follow-up with your physician.  Return here for concerning changes in your condition.

## 2023-01-30 NOTE — ED Triage Notes (Signed)
Pt sticking his finger into his mouth, states that he is trying to get it up because then his stomach will feel better.

## 2023-01-30 NOTE — ED Notes (Signed)
Pt states improvement in symptoms. Pt able to tolerate PO.

## 2023-01-30 NOTE — ED Triage Notes (Signed)
Pt to er via [facility], states that they picked him up from urgent care, states that he was being seen for abd pain, states that he had an elevated blood sugar.

## 2023-01-30 NOTE — ED Notes (Signed)
Patient Alert and oriented to baseline. Stable and ambulatory to baseline. Patient verbalized understanding of the discharge instructions.  Patient belongings were taken by the patient.   

## 2023-01-30 NOTE — ED Notes (Signed)
Pt with generalized abd pain onset 0700 today along with nausea and vomiting. Endorses smoking marijuana last night before bed. Pt in NAD.

## 2023-01-30 NOTE — ED Provider Notes (Signed)
[place] EMERGENCY DEPARTMENT AT [facility] Provider Note   CSN: [id number] Arrival date & time: 01/30/23  1203     History  Chief Complaint  Patient presents with   Abdominal Pain    Shawn Benson is a 42 y.o. male.  HPI Presents about 12 hours after being urged from our facility now with concern for abdominal pain, nausea, vomiting. Patient states that he was feeling better on discharge, smokes marijuana since that time.  Today with recurrence of abdominal pain hematology care.  There after he is found to be hyperglycemic he was sent here for additional evaluation. Patient expresses skepticism about possible linkage between marijuana use and abdominal pain, states that he has had no relief with anything. It is unclear if he has been taking his medication since discharge last night. He does deny fever, chest pain, states the abdominal pain is diffuse.    Home Medications Prior to Admission medications   Medication Sig Start Date End Date Taking? Authorizing Provider  Blood Glucose Monitoring Suppl (ACCU-CHEK AVIVA PLUS) w/Device KIT Check blood sugar before meals 3 times daily 09/16/20   Julieanne Manson, MD  clotrimazole [facility]) 10 MG troche SMARTSIG:1 Lozenge(s) By Mouth Twice Daily Patient not taking: Reported on 01/08/2023 12/11/22   [provider]  furosemide (LASIX) 20 MG tablet Take 20 mg by mouth daily. Patient not taking: Reported on 01/08/2023 12/11/22   [provider]  gabapentin (NEURONTIN) 300 MG capsule  12/11/22   [provider]  insulin NPH-regular Human (NOVOLIN 70/30) (70-30) 100 UNIT/ML injection Inject 52 Units into the skin 2 (two) times daily with a meal. 52 units subcutaneously twice daily before meals Patient taking differently: Inject 60 Units into the skin in the morning and at bedtime. 07/10/16   Julieanne Manson, MD  Insulin Syringe-Needle U-100 (INSULIN SYRINGE .5CC/31GX5/16") 31G X 5/16" 0.5 ML MISC 1  Device by Does not apply route 2 (two) times daily with a meal. 10/20/16   Julieanne Manson, MD  KLOR-CON 20 MEQ packet Take 20 mEq by mouth daily. Patient not taking: Reported on 01/08/2023 12/11/22   [provider]  metoCLOPramide (REGLAN) 10 MG tablet Take 1 tablet (10 mg total) by mouth 3 (three) times daily with meals. 11/30/22 02/28/23  Noralee Stain, DO  pantoprazole (PROTONIX) 40 MG tablet Take 1 tablet (40 mg total) by mouth 2 (two) times daily before a meal. 11/30/22 02/28/23  Noralee Stain, DO      Allergies    Patient has no known allergies.    Review of Systems   Review of Systems  All other systems reviewed and are negative.   Physical Exam Updated Vital Signs BP (!) 148/69   Pulse 62   Temp 98.8 F (37.1 C) (Oral)   Resp 18   Ht  (2.007 m)   Wt (!) 156.9 kg   SpO2 97%   BMI 38.98 kg/m  Physical Exam Vitals and nursing note reviewed.  Constitutional:      General: He is not in acute distress.    Appearance: He is well-developed. He is obese. He is not toxic-appearing or diaphoretic.     Comments: Large adult male awake and alert right lateral decubitus position  HENT:     Head: Normocephalic and atraumatic.  Eyes:     Conjunctiva/sclera: Conjunctivae normal.  Cardiovascular:     Rate and Rhythm: Normal rate and regular rhythm.  Pulmonary:     Effort: Pulmonary effort is normal. No respiratory  distress.     Breath sounds: No stridor.  Abdominal:     General: There is no distension.     Tenderness: There is generalized abdominal tenderness.  Skin:    General: Skin is warm and dry.  Neurological:     Mental Status: He is alert and oriented to person, place, and time.     ED Results / Procedures / Treatments   Labs (all labs ordered are listed, but only abnormal results are displayed) Labs Reviewed  COMPREHENSIVE METABOLIC PANEL - Abnormal; Notable for the following components:      Result Value   Glucose, Bld 479 (*)    AST 13 (*)     All other components within normal limits  CBC - Abnormal; Notable for the following components:   WBC 11.5 (*)    All other components within normal limits  CBG MONITORING, ED - Abnormal; Notable for the following components:   Glucose-Capillary 497 (*)    All other components within normal limits  LIPASE, BLOOD  URINALYSIS, ROUTINE W REFLEX MICROSCOPIC    EKG None  Radiology No results found.  Procedures Procedures    Medications Ordered in ED Medications  droperidol (INAPSINE) 2.5 MG/ML injection 1.25 mg (1.25 mg Intravenous Given 01/30/23 1251)  lactated ringers bolus 1,000 mL (0 mLs Intravenous Stopped 01/30/23 1603)    ED Course/ Medical Decision Making/ A&P                             Medical Decision Making Adult male with multiple medical problems including obesity, diabetes, possible marijuana associated abdominal pain presents with abdominal pain, nausea, vomiting in the context of recent ED evaluation for hyperglycemia. Patient is afebrile, hemodynamically unremarkable aside from mild hypertension.  No tachycardia. Patient is found to have hyperglycemia on point-of-care testing initially and differential includes DKA, nonketotic hyperosmolar state, cannabinoid hyperemesis syndrome.  Patient had labs fluids, droperidol after my initial evaluation.   Amount and/or Complexity of Data Reviewed External Data Reviewed: notes.    Details: I reviewed his notes from within the past 24 hours. Labs: ordered. Decision-making details documented in ED Course.  Risk Prescription drug management. Decision regarding hospitalization.  Cardiac 55 sinus bradycardia borderline Pulse ox 99% room air normal  4:20 PM Patient has passed p.o. challenge, blood pressure now 150/70.  Labs reviewed, hyperglycemia without evidence for DKA, patient encouraged to avoid marijuana use, follow-up with the physician.        Final Clinical Impression(s) / ED Diagnoses Final diagnoses:   Generalized abdominal pain    Rx / DC Orders ED Discharge Orders     None         Gerhard Munch, MD 01/30/23 1620

## 2023-01-31 ENCOUNTER — Encounter: Payer: BC Managed Care – PPO | Admitting: Internal Medicine

## 2023-02-07 NOTE — Progress Notes (Signed)
Predetermination sent ,  will update with response when received

## 2023-03-20 DIAGNOSIS — E1165 Type 2 diabetes mellitus with hyperglycemia: Secondary | ICD-10-CM | POA: Diagnosis not present

## 2023-03-20 DIAGNOSIS — N529 Male erectile dysfunction, unspecified: Secondary | ICD-10-CM | POA: Diagnosis not present

## 2023-04-05 DIAGNOSIS — E1165 Type 2 diabetes mellitus with hyperglycemia: Secondary | ICD-10-CM | POA: Diagnosis not present

## 2023-04-05 DIAGNOSIS — E1142 Type 2 diabetes mellitus with diabetic polyneuropathy: Secondary | ICD-10-CM | POA: Diagnosis not present

## 2023-04-10 DIAGNOSIS — L0231 Cutaneous abscess of buttock: Secondary | ICD-10-CM | POA: Diagnosis not present

## 2023-04-12 ENCOUNTER — Emergency Department (HOSPITAL_BASED_OUTPATIENT_CLINIC_OR_DEPARTMENT_OTHER)
Admission: EM | Admit: 2023-04-12 | Discharge: 2023-04-12 | Disposition: A | Discharge status: Home / Self Care | Payer: BC Managed Care – PPO | Attending: Emergency Medicine | Admitting: Emergency Medicine

## 2023-04-12 ENCOUNTER — Other Ambulatory Visit: Payer: Self-pay

## 2023-04-12 ENCOUNTER — Encounter (HOSPITAL_BASED_OUTPATIENT_CLINIC_OR_DEPARTMENT_OTHER): Payer: Self-pay

## 2023-04-12 ENCOUNTER — Emergency Department (HOSPITAL_BASED_OUTPATIENT_CLINIC_OR_DEPARTMENT_OTHER): Payer: BC Managed Care – PPO

## 2023-04-12 DIAGNOSIS — J45909 Unspecified asthma, uncomplicated: Secondary | ICD-10-CM | POA: Diagnosis not present

## 2023-04-12 DIAGNOSIS — N400 Enlarged prostate without lower urinary tract symptoms: Secondary | ICD-10-CM | POA: Diagnosis not present

## 2023-04-12 DIAGNOSIS — N3289 Other specified disorders of bladder: Secondary | ICD-10-CM | POA: Diagnosis not present

## 2023-04-12 DIAGNOSIS — L03317 Cellulitis of buttock: Secondary | ICD-10-CM | POA: Insufficient documentation

## 2023-04-12 DIAGNOSIS — E119 Type 2 diabetes mellitus without complications: Secondary | ICD-10-CM | POA: Insufficient documentation

## 2023-04-12 DIAGNOSIS — L0231 Cutaneous abscess of buttock: Secondary | ICD-10-CM | POA: Diagnosis not present

## 2023-04-12 DIAGNOSIS — Z794 Long term (current) use of insulin: Secondary | ICD-10-CM | POA: Diagnosis not present

## 2023-04-12 LAB — CBC WITH DIFFERENTIAL/PLATELET
Abs Immature Granulocytes: 0.03 10*3/uL (ref 0.00–0.07)
Basophils Absolute: 0.1 10*3/uL (ref 0.0–0.1)
Basophils Relative: 0 %
Eosinophils Absolute: 0.5 10*3/uL (ref 0.0–0.5)
Eosinophils Relative: 4 %
HCT: 40.1 % (ref 39.0–52.0)
Hemoglobin: 13.1 g/dL (ref 13.0–17.0)
Immature Granulocytes: 0 %
Lymphocytes Relative: 16 %
Lymphs Abs: 1.8 10*3/uL (ref 0.7–4.0)
MCH: 26.4 pg (ref 26.0–34.0)
MCHC: 32.7 g/dL (ref 30.0–36.0)
MCV: 80.8 fL (ref 80.0–100.0)
Monocytes Absolute: 0.8 10*3/uL (ref 0.1–1.0)
Monocytes Relative: 7 %
Neutro Abs: 8.4 10*3/uL — ABNORMAL HIGH (ref 1.7–7.7)
Neutrophils Relative %: 73 %
Platelets: 303 10*3/uL (ref 150–400)
RBC: 4.96 MIL/uL (ref 4.22–5.81)
RDW: 13.3 % (ref 11.5–15.5)
WBC: 11.6 10*3/uL — ABNORMAL HIGH (ref 4.0–10.5)
nRBC: 0 % (ref 0.0–0.2)

## 2023-04-12 LAB — BASIC METABOLIC PANEL
Anion gap: 9 (ref 5–15)
BUN: 12 mg/dL (ref 6–20)
CO2: 27 mmol/L (ref 22–32)
Calcium: 9.5 mg/dL (ref 8.9–10.3)
Chloride: 105 mmol/L (ref 98–111)
Creatinine, Ser: 0.64 mg/dL (ref 0.61–1.24)
GFR, Estimated: >60 mL/min (ref >60)
Glucose, Bld: 157 mg/dL — ABNORMAL HIGH (ref 70–99)
Potassium: 4.1 mmol/L (ref 3.5–5.1)
Sodium: 141 mmol/L (ref 135–145)

## 2023-04-12 MED ORDER — FENTANYL CITRATE PF 50 MCG/ML IJ SOSY
50.0000 ug | PREFILLED_SYRINGE | Freq: Once | INTRAMUSCULAR | Status: AC
  Administered 2023-04-12: 50 ug via INTRAVENOUS
  Filled 2023-04-12: qty 1

## 2023-04-12 MED ORDER — OXYCODONE HCL 5 MG PO TABS: 5.0000 mg | ORAL_TABLET | Freq: Four times a day (QID) | ORAL | 0 refills | Status: DC | PRN

## 2023-04-12 MED ORDER — LIDOCAINE-EPINEPHRINE (PF) 2 %-1:200000 IJ SOLN
20.0000 mL | Freq: Once | INTRAMUSCULAR | Status: AC
  Administered 2023-04-12: 20 mL
  Filled 2023-04-12: qty 20

## 2023-04-12 MED ORDER — IOHEXOL 300 MG/ML  SOLN
100.0000 mL | Freq: Once | INTRAMUSCULAR | Status: AC | PRN
  Administered 2023-04-12: 100 mL via INTRAVENOUS

## 2023-04-12 MED ORDER — OXYCODONE HCL 5 MG PO TABS
5.0000 mg | ORAL_TABLET | Freq: Once | ORAL | Status: AC
  Administered 2023-04-12: 5 mg via ORAL
  Filled 2023-04-12: qty 1

## 2023-04-12 NOTE — ED Provider Notes (Incomplete)
[place] EMERGENCY DEPARTMENT AT [facility] Provider Note   CSN: [id number] Arrival date & time: 04/12/23  1026     History {Add pertinent medical, surgical, social history, OB history to HPI:1} Chief Complaint  Patient presents with  . Abscess    Shawn Benson is a 42 y.o. male with past medical history significant for diabetes, asthma, obesity presents to the ED for an abscess on his buttock near his anus.  Patient states that it has been going on for the past 2 weeks.  Patient did go see his primary care provider who attempted to drain the abscess, but was unsuccessful.  Patient was started on doxycycline, but reports that he continues to have pressure and pain in this area.  Patient reports he did have a similar abscess approximately 8 years ago that he had to have surgery for.  Denies fever, chills, rash.       Home Medications Prior to Admission medications   Medication Sig Start Date End Date Taking? Authorizing Provider  Blood Glucose Monitoring Suppl (ACCU-CHEK AVIVA PLUS) w/Device KIT Check blood sugar before meals 3 times daily 09/16/20   Julieanne Manson, MD  clotrimazole [facility]) 10 MG troche SMARTSIG:1 Lozenge(s) By Mouth Twice Daily Patient not taking: Reported on 01/08/2023 12/11/22   [provider]  furosemide (LASIX) 20 MG tablet Take 20 mg by mouth daily. Patient not taking: Reported on 01/08/2023 12/11/22   [provider]  gabapentin (NEURONTIN) 300 MG capsule  12/11/22   [provider]  insulin NPH-regular Human (NOVOLIN 70/30) (70-30) 100 UNIT/ML injection Inject 52 Units into the skin 2 (two) times daily with a meal. 52 units subcutaneously twice daily before meals Patient taking differently: Inject 60 Units into the skin in the morning and at bedtime. 07/10/16   Julieanne Manson, MD  Insulin Syringe-Needle U-100 (INSULIN SYRINGE .5CC/31GX5/16") 31G X 5/16" 0.5 ML MISC 1 Device by Does not apply route 2 (two) times daily  with a meal. 10/20/16   Julieanne Manson, MD  KLOR-CON 20 MEQ packet Take 20 mEq by mouth daily. Patient not taking: Reported on 01/08/2023 12/11/22   [provider]  metoCLOPramide (REGLAN) 10 MG tablet Take 1 tablet (10 mg total) by mouth 3 (three) times daily with meals. 11/30/22 02/28/23  Noralee Stain, DO  pantoprazole (PROTONIX) 40 MG tablet Take 1 tablet (40 mg total) by mouth 2 (two) times daily before a meal. 11/30/22 02/28/23  Noralee Stain, DO      Allergies    Metformin and related    Review of Systems   Review of Systems  Constitutional:  Negative for chills and fever.  Skin:  Negative for rash.       Abscess in buttock    Physical Exam Updated Vital Signs BP 134/82 (BP Location: Right Arm)   Pulse 70   Temp 99 F (37.2 C) (Oral)   Resp 18   Ht 6\' 7"  (2.007 m)   Wt (!) 156 kg   SpO2 100%   BMI 38.75 kg/m  Physical Exam Vitals and nursing note reviewed.  Constitutional:      General: He is not in acute distress.    Appearance: Normal appearance. He is not ill-appearing or diaphoretic.  Cardiovascular:     Rate and Rhythm: Normal rate and regular rhythm.  Pulmonary:     Effort: Pulmonary effort is normal.  Genitourinary:    Comments: There is approximately 3 cm fluctuant, erythematous mass on the right buttock at the inferior  gluteal crease.  Area is tender.  Surrounding area with induration and a 0.5 cm ulcerative wound. Neurological:     Mental Status: He is alert. Mental status is at baseline.  Psychiatric:        Mood and Affect: Mood normal.        Behavior: Behavior normal.     ED Results / Procedures / Treatments   Labs (all labs ordered are listed, but only abnormal results are displayed) Labs Reviewed - No data to display  EKG None  Radiology No results found.  Procedures Procedures  {Document cardiac monitor, telemetry assessment procedure when appropriate:1}  Medications Ordered in ED Medications - No data to display  ED  Course/ Medical Decision Making/ A&P   {   Click here for ABCD2, HEART and other calculatorsREFRESH Note before signing :1}                          Medical Decision Making Amount and/or Complexity of Data Reviewed Labs: ordered. Radiology: ordered.  Risk Prescription drug management.   ***  {Document critical care time when appropriate:1} {Document review of labs and clinical decision tools ie heart score, Chads2Vasc2 etc:1}  {Document your independent review of radiology images, and any outside records:1} {Document your discussion with family members, caretakers, and with consultants:1} {Document social determinants of health affecting pt's care:1} {Document your decision making why or why not admission, treatments were needed:1} Final Clinical Impression(s) / ED Diagnoses Final diagnoses:  None    Rx / DC Orders ED Discharge Orders     None

## 2023-04-12 NOTE — Discharge Instructions (Addendum)
Thank you for allowing me to be part of your care today.  You were evaluated in the ED for an abscess of your buttock.  Your abscess was successfully drained and a small amount of packing material was left in there to help it continue to drain.  This packing material should fall out on its own in the next few days.  In the meantime, I do not recommend submerging yourself in water but you may shower normally.  I do recommend using an antibacterial soap in the area such as Dial gold or Hibiclens.  I have sent you home on a short course of pain medicine to help with severe pain over the next couple of days.  Otherwise, if you are experiencing mild to moderate pain, I recommend taking ibuprofen 600 to 800 mg every 6-8 hours as needed.  Continue taking your doxycycline as prescribed.  Please follow-up with your primary care provider to ensure this area is improving and to also recheck the other wound you have.  Return to the ED if you develop sudden worsening of your symptoms, develop signs of systemic infection such as fever, lightheadedness, rapid heartbeat, confusion, or if you have any new concerns.

## 2023-04-12 NOTE — ED Notes (Signed)
Pt in bed, pt has wound on his R buttock, states that they attempted and I and D a couple of days ago, no redness noted, pt has small area of induration.  No drainage noted.

## 2023-04-12 NOTE — ED Notes (Signed)
RN reviewed discharge instructions with pt. Pt verbalized understanding and had no further questions. VSS upon discharge.  

## 2023-04-12 NOTE — ED Triage Notes (Signed)
Pt to er, pt states that he is here for an abscess on his buttock near his rectum, states that he had one about 8 years ago that he had to have surgery for.

## 2023-04-13 NOTE — ED Provider Notes (Signed)
[place] EMERGENCY DEPARTMENT AT [facility] Provider Note   CSN: [id number] Arrival date & time: 04/12/23  1026     History  Chief Complaint  Patient presents with   Abscess    Shawn Benson is a 42 y.o. male with past medical history significant for diabetes, asthma, obesity presents to the ED for an abscess on his buttock near his anus.  Patient states that it has been going on for the past 2 weeks.  Patient did go see his primary care provider who attempted to drain the abscess, but was unsuccessful.  Patient was started on doxycycline, but reports that he continues to have pressure and pain in this area.  Patient reports he did have a similar abscess approximately 8 years ago that he had to have surgery for.  Denies fever, chills, rash.        Home Medications Prior to Admission medications   Medication Sig Start Date End Date Taking? Authorizing Provider  oxyCODONE (ROXICODONE) 5 MG immediate release tablet Take 1-2 tablets (5-10 mg total) by mouth every 6 (six) hours as needed for severe pain. 04/12/23  Yes Pelham Hennick R, PA-C  Blood Glucose Monitoring Suppl (ACCU-CHEK AVIVA PLUS) w/Device KIT Check blood sugar before meals 3 times daily 09/16/20   Julieanne Manson, MD  clotrimazole [facility]) 10 MG troche SMARTSIG:1 Lozenge(s) By Mouth Twice Daily Patient not taking: Reported on 01/08/2023 12/11/22   [provider]  furosemide (LASIX) 20 MG tablet Take 20 mg by mouth daily. Patient not taking: Reported on 01/08/2023 12/11/22   [provider]  gabapentin (NEURONTIN) 300 MG capsule  12/11/22   [provider]  insulin NPH-regular Human (NOVOLIN 70/30) (70-30) 100 UNIT/ML injection Inject 52 Units into the skin 2 (two) times daily with a meal. 52 units subcutaneously twice daily before meals Patient taking differently: Inject 60 Units into the skin in the morning and at bedtime. 07/10/16   Julieanne Manson, MD  Insulin Syringe-Needle U-100  (INSULIN SYRINGE .5CC/31GX5/16") 31G X 5/16" 0.5 ML MISC 1 Device by Does not apply route 2 (two) times daily with a meal. 10/20/16   Julieanne Manson, MD  KLOR-CON 20 MEQ packet Take 20 mEq by mouth daily. Patient not taking: Reported on 01/08/2023 12/11/22   [provider]  metoCLOPramide (REGLAN) 10 MG tablet Take 1 tablet (10 mg total) by mouth 3 (three) times daily with meals. 11/30/22 02/28/23  Noralee Stain, DO  pantoprazole (PROTONIX) 40 MG tablet Take 1 tablet (40 mg total) by mouth 2 (two) times daily before a meal. 11/30/22 02/28/23  Noralee Stain, DO      Allergies    Metformin and related    Review of Systems   Review of Systems  Physical Exam Updated Vital Signs BP (!) 109/55   Pulse 72   Temp 98.5 F (36.9 C) (Oral)   Resp 16   Ht 6\' 7"  (2.007 m)   Wt (!) 156 kg   SpO2 100%   BMI 38.75 kg/m  Physical Exam Vitals and nursing note reviewed.  Constitutional:      General: He is not in acute distress.    Appearance: Normal appearance. He is not ill-appearing or diaphoretic.  Cardiovascular:     Rate and Rhythm: Normal rate and regular rhythm.  Pulmonary:     Effort: Pulmonary effort is normal.  Genitourinary:    Comments: There is approximately 3 cm fluctuant, erythematous mass on the right buttock at the superior gluteal crease.  Area  is tender.  Surrounding area with induration and a 0.5 cm ulcerative wound. Neurological:     Mental Status: He is alert. Mental status is at baseline.  Psychiatric:        Mood and Affect: Mood normal.        Behavior: Behavior normal.     ED Results / Procedures / Treatments   Labs (all labs ordered are listed, but only abnormal results are displayed) Labs Reviewed  BASIC METABOLIC PANEL - Abnormal; Notable for the following components:      Result Value   Glucose, Bld 157 (*)    All other components within normal limits  CBC WITH DIFFERENTIAL/PLATELET - Abnormal; Notable for the following components:   WBC 11.6  (*)    Neutro Abs 8.4 (*)    All other components within normal limits    EKG None  Radiology CT PELVIS W CONTRAST  Result Date: 04/12/2023 CLINICAL DATA:  Perianal abscess/fistula EXAM: CT PELVIS WITH CONTRAST TECHNIQUE: Multidetector CT imaging of the pelvis was performed using the standard protocol following the bolus administration of intravenous contrast. RADIATION DOSE REDUCTION: This exam was performed according to the departmental dose-optimization program which includes automated exposure control, adjustment of the mA and/or kV according to patient size and/or use of iterative reconstruction technique. CONTRAST:  OMNIPAQUE IOHEXOL 300 MG/ML  SOLN COMPARISON:  11/22/2022 FINDINGS: Urinary Tract: Distal ureters decompressed. Urinary bladder partially distended with mild wall thickening. Bowel: Visualized small bowel decompressed. Normal appendix. Visualized colon is nondistended, without acute finding. Vascular/Lymphatic: No pathologically enlarged lymph nodes. No significant vascular abnormality seen. Reproductive:  Mild prostate enlargement Other:  Pelvic phleboliths.  No pelvic ascites or evident free air. Musculoskeletal: 5.8 cm subcutaneous abscess right inferomedial gluteal, with regional subcutaneous inflammatory change. No evidence of fistula. IMPRESSION: 5.8 cm right inferomedial gluteal subcutaneous abscess. Electronically Signed   By: Corlis Leak M.D.   On: 04/12/2023 15:26    Procedures .Marland KitchenIncision and Drainage  Date/Time: 04/13/2023 8:08 AM  Performed by: Lenard Simmer, PA-C Authorized by: Lenard Simmer, PA-C   Consent:    Consent obtained:  Verbal   Consent given by:  Patient   Risks discussed:  Bleeding, incomplete drainage, pain and damage to other organs   Alternatives discussed:  No treatment Universal protocol:    Procedure explained and questions answered to patient or proxy's satisfaction: yes     Test results available : yes     Imaging studies available:  yes     Patient identity confirmed:  Verbally with patient and arm band Location:    Type:  Abscess   Size:  5.8 cm   Location:  Anogenital   Anogenital location:  Gluteal cleft Pre-procedure details:    Skin preparation:  Povidone-iodine Anesthesia:    Anesthesia method:  Local infiltration   Local anesthetic:  Lidocaine 2% WITH epi Procedure type:    Complexity:  Complex Procedure details:    Incision types:  Single straight   Incision depth:  Subcutaneous   Wound management:  Probed and deloculated, irrigated with saline and extensive cleaning   Drainage:  Purulent   Drainage amount:  Copious   Packing materials:  1/4 in gauze   Amount 1/4":  4 inches Post-procedure details:    Procedure completion:  Tolerated well, no immediate complications     Medications Ordered in ED Medications  fentaNYL (SUBLIMAZE) injection 50 mcg (50 mcg Intravenous Given 04/12/23 1355)  iohexol (OMNIPAQUE) 300 MG/ML solution 100 mL (100  mLs Intravenous Contrast Given 04/12/23 1500)  lidocaine-EPINEPHrine (XYLOCAINE W/EPI) 2 %-1:200000 (PF) injection 20 mL (20 mLs Infiltration Given 04/12/23 1548)  fentaNYL (SUBLIMAZE) injection 50 mcg (50 mcg Intravenous Given 04/12/23 1607)  oxyCODONE (Oxy IR/ROXICODONE) immediate release tablet 5 mg (5 mg Oral Given 04/12/23 1746)    ED Course/ Medical Decision Making/ A&P                             Medical Decision Making Amount and/or Complexity of Data Reviewed Labs: ordered. Radiology: ordered.  Risk Prescription drug management.   This patient presents to the ED with chief complaint(s) of gluteal abscess with pertinent past medical history of diabetes, previous gluteal abscess.  The complaint involves an extensive differential diagnosis and also carries with it a high risk of complications and morbidity.    The differential diagnosis includes gluteal abscess, deep space infection, perirectal abscess, cellulitis, fistula   The initial plan is to obtain  baseline labs, CT imaging to assess for deep space infection/fistula  Additional history obtained: Records reviewed  - patient brought with him paper records from seeing PCP.  PCP attempted I&D on abscess, but was unsuccessful.  Patient was started on doxycycline.   Initial Assessment:   Exam significant for approximately 3 cm fluctuant, erythematous mass on the right buttock at the inferior gluteal crease.  This area is tender and concerning for abscess.  Surrounding area with induration and a 0.5 cm ulcerative wound.    Independent ECG/labs interpretation:  The following labs were independently interpreted:  CBC with leukocytosis, no anemia.  Metabolic panel without major disturbance.    Independent visualization and interpretation of imaging: I independently visualized the following imaging with scope of interpretation limited to determining acute life threatening conditions related to emergency care: CT pelvis, which revealed 5.8 cm subcutaneous abscess right inferomedial gluteal with regional subcutaneous inflammatory change.  No fistula.  I agree with radiologist interpretation.   Treatment and Reassessment: I&D was successfully performed on gluteal abscess, see procedure section for more detail.   Disposition:   Advised patient to remain on doxycycline as prescribed by PCP and to follow up with them for a wound re-check.  Discussed wound care and hygiene with patient.  The patient has been appropriately medically screened and/or stabilized in the ED. I have low suspicion for any other emergent medical condition which would require further screening, evaluation or treatment in the ED or require inpatient management. At time of discharge the patient is hemodynamically stable and in no acute distress. I have discussed work-up results and diagnosis with patient and answered all questions. Patient is agreeable with discharge plan. We discussed strict return precautions for returning to the  emergency department and they verbalized understanding.            Final Clinical Impression(s) / ED Diagnoses Final diagnoses:  Abscess and cellulitis of gluteal region    Rx / DC Orders ED Discharge Orders          Ordered    oxyCODONE (ROXICODONE) 5 MG immediate release tablet  Every 6 hours PRN        04/12/23 1737              Lenard Simmer, PA-C 04/13/23 0810    Sloan Leiter, DO 04/13/23 [phone]

## 2023-05-08 NOTE — Progress Notes (Unsigned)
Name: Shawn Benson  MRN/ DOB: [id number], 10/27/1980   Age/ Sex: 42 y.o., male    PCP: Carin Hock, [place]   Reason for Endocrinology Evaluation: Type 2 Diabetes Mellitus     Date of Initial Endocrinology Visit: 05/09/2023     PATIENT IDENTIFIER: [name] is a 42 y.o. male with a past medical history of asthma, DM, OSA. The patient presented for initial endocrinology clinic visit on 05/09/2023 for consultative assistance with his diabetes management.    HPI: Shawn Benson was    Diagnosed with DM  8 yrs ago Prior Medications tried/Intolerance: Metformin- leg swelling , Ryebelsus - leg swelling. Jardiance - he opted not to take  Currently checking blood sugars occasionally   Hypoglycemia episodes : no              Hemoglobin A1c has ranged from 7.6% in 2017, peaking at >15.5% in 2018.  In terms of diet, the patient eats 2 meals a day, does not snack.  Has gastroparesis , drinks smoothies for breakfast   He drank juice this am  He skipped insulin last night   He has had nausea and vomiting , requiring hospitalization  Saw Dr. Roanna Raider last month    HOME DIABETES REGIMEN: NPH 60 units twice daily   Statin: no ACE-I/ARB: no   METER DOWNLOAD SUMMARY: n/a    DIABETIC COMPLICATIONS: Microvascular complications:   Denies: CKD Last eye exam: Completed   Macrovascular complications:   Denies: CAD, PVD, CVA   PAST HISTORY: Past Medical History:  Past Medical History:  Diagnosis Date   Allergy    Seasonal--spring through fall.   Asthma childhood and adult   triggers are allergies and URI.  Spring, summer and fall.   Diabetes mellitus without complication (HCC)    Morbid obesity (HCC)    Peripheral edema 04/09/2017   Past Surgical History:  Past Surgical History:  Procedure Laterality Date   INCISION AND DRAINAGE PERIRECTAL ABSCESS N/A 10/06/2015   Procedure: IRRIGATION AND DEBRIDEMENT PERIANAL  ABSCESS;  Surgeon: Ovidio Kin, MD;  Location: [facility]S;   Service: General;  Laterality: N/A;   KNEE SURGERY     Right and Left Knee   UPPER GASTROINTESTINAL ENDOSCOPY  01/08/2023    Social History:  reports that he has been smoking cigarettes. He has never used smokeless tobacco. He reports current alcohol use. He reports current drug use. Drug: Marijuana. Family History:  Family History  Problem Relation Age of Onset   Diabetes Mother    Diabetes Father    Diabetes Brother    Diabetes Brother    Colon cancer Neg Hx    Stomach cancer Neg Hx    Esophageal cancer Neg Hx      HOME MEDICATIONS: Allergies as of 05/09/2023       Reactions   Metformin And Related Swelling        Medication List        Accurate as of May 09, 2023  8:53 AM. If you have any questions, ask your nurse or doctor.          Accu-Chek Aviva Plus w/Device Kit Check blood sugar before meals 3 times daily   clotrimazole 10 MG troche Commonly known as: MYCELEX SMARTSIG:1 Lozenge(s) By Mouth Twice Daily   furosemide 20 MG tablet Commonly known as: LASIX Take 20 mg by mouth daily.   gabapentin 300 MG capsule Commonly known as: NEURONTIN   insulin NPH-regular Human (70-30) 100 UNIT/ML injection Inject 52  Units into the skin 2 (two) times daily with a meal. 52 units subcutaneously twice daily before meals What changed:  how much to take when to take this additional instructions   INSULIN SYRINGE .5CC/31GX5/16" 31G X 5/16" 0.5 ML Misc 1 Device by Does not apply route 2 (two) times daily with a meal.   Klor-Con 20 MEQ packet Generic drug: potassium chloride Take 20 mEq by mouth daily.   metoCLOPramide 10 MG tablet Commonly known as: REGLAN Take 1 tablet (10 mg total) by mouth 3 (three) times daily with meals. What changed: when to take this   oxyCODONE 5 MG immediate release tablet Commonly known as: Roxicodone Take 1-2 tablets (5-10 mg total) by mouth every 6 (six) hours as needed for severe pain.   pantoprazole 40 MG tablet Commonly  known as: PROTONIX Take 1 tablet (40 mg total) by mouth 2 (two) times daily before a meal.         ALLERGIES: Allergies  Allergen Reactions   Metformin And Related Swelling     REVIEW OF SYSTEMS: A comprehensive ROS was conducted with the patient and is negative except as per HPI     OBJECTIVE:   VITAL SIGNS: Ht 6\' 7"  (2.007 m)   Wt (!) 345 lb (156.5 kg)   BMI 38.87 kg/m    PHYSICAL EXAM:  General: Pt appears well and is in NAD  Neck: General: Supple without adenopathy or carotid bruits. Thyroid: Thyroid size normal.  No goiter or nodules appreciated.   Lungs: Clear with good BS bilat   Heart: RRR   Abdomen:  soft, nontender  Extremities:  Lower extremities - trace pretibial edema.   Neuro: MS is good with appropriate affect, pt is alert and Ox3     DATA REVIEWED:  Lab Results  Component Value Date   HGBA1C 12.9 (H) 11/26/2022   HGBA1C 12.9 (H) 06/28/2021   HGBA1C 11.2 (H) 07/08/2020    Latest Reference Range & Units 04/12/23 13:42  Sodium 135 - 145 mmol/L 141  Potassium 3.5 - 5.1 mmol/L 4.1  Chloride 98 - 111 mmol/L 105  CO2 22 - 32 mmol/L 27  Glucose 70 - 99 mg/dL 161 (H)  BUN 6 - 20 mg/dL 12  Creatinine 0.96 - 0.45 mg/dL 4.09  Calcium 8.9 - 81.1 mg/dL 9.5  Anion gap 5 - 15  9  GFR, Estimated >60 mL/min >60  (H): Data is abnormally high  In office BG 391 mg/dL   Old records , labs and images have been reviewed.   ASSESSMENT / PLAN / RECOMMENDATIONS:   1) Type 2 Diabetes Mellitus, Poorly controlled, Without complications - Most recent A1c of 11.4 %. Goal A1c < 7.0 %.    - I have discussed with the patient the pathophysiology of diabetes. We went over the natural progression of the disease. We stressed the importance of lifestyle changes. I explained the complications associated with diabetes including retinopathy, nephropathy, neuropathy as well as increased risk of cardiovascular disease. We went over the benefit seen with glycemic control.   -  I explained to the patient that diabetic patients are at higher than normal risk for amputations.  -Poorly controlled diabetes due to dietary indiscretions as well as medication nonadherence -Patient is requesting samples of freestyle libre as they have been cost prohibitive, I explained to the patient the importance of having long-term management plan, I prescribed Dexcom to see if that has a better coverage, but it appears that he has been paying cash  price for freestyle libre -I will switch Novolin to basal insulin analog as below -His endocrinologist Dr. Roanna Raider attempted to prescribe Jardiance, but he was concerned about lower extremity edema, patient endorses lower extremity edema with Rybelsus and metformin? -We discussed cardiovascular and renal benefits of Jardiance as well as weight benefits, caution against genital infections.  He was provided with a coupon -Patient is NOT a candidate for GLP-1 agonist due to gastroparesis -Discussed the importance of avoiding sugar sweetened beverages, discussed the importance of avoiding snacks if possible, discussed options for low-carb snacks if necessary   MEDICATIONS: Stop Novolin mix Start Jardiance 10 mg daily Start Toujeo 60 units once daily   EDUCATION / INSTRUCTIONS: BG monitoring instructions: Patient is instructed to check his blood sugars 1 times a day. Call [place] [facility] if: BG persistently < 70  I reviewed the Rule of 15 for the treatment of hypoglycemia in detail with the patient. Literature supplied.   2) Diabetic complications:  Eye: Does not have known diabetic retinopathy.  Neuro/ Feet: Does not have known diabetic peripheral neuropathy. Renal: Patient does not have known baseline CKD. He is not on an ACEI/ARB at present.       Signed electronically by: Lyndle Herrlich, MD  [facility]  [facility] [address] [place], [place] [address] Phone:  [phone] FAX: [phone]   CC: Carin Hock, PA [address] [place] [address] Phone: [phone]  Fax: [phone]    Return to Endocrinology clinic as below: Future Appointments  Date Time Provider Department Center  06/04/2023  4:15 PM Freddie Breech, DPM [facility]

## 2023-05-09 ENCOUNTER — Ambulatory Visit (INDEPENDENT_AMBULATORY_CARE_PROVIDER_SITE_OTHER): Payer: BC Managed Care – PPO | Admitting: Internal Medicine

## 2023-05-09 ENCOUNTER — Encounter: Payer: Self-pay | Admitting: Internal Medicine

## 2023-05-09 ENCOUNTER — Telehealth: Payer: Self-pay | Admitting: Internal Medicine

## 2023-05-09 VITALS — BP 120/78 | HR 70 | Ht 79.0 in | Wt 345.0 lb

## 2023-05-09 DIAGNOSIS — E1165 Type 2 diabetes mellitus with hyperglycemia: Secondary | ICD-10-CM

## 2023-05-09 DIAGNOSIS — Z794 Long term (current) use of insulin: Secondary | ICD-10-CM

## 2023-05-09 LAB — POCT GLYCOSYLATED HEMOGLOBIN (HGB A1C): Hemoglobin A1C: 11.2 % — AB (ref 4.0–5.6)

## 2023-05-09 LAB — POCT GLUCOSE (DEVICE FOR HOME USE): POC Glucose: 391 mg/dl — AB (ref 70–99)

## 2023-05-09 MED ORDER — EMPAGLIFLOZIN 10 MG PO TABS: 10.0000 mg | ORAL_TABLET | Freq: Every day | ORAL | 2 refills | Status: DC

## 2023-05-09 MED ORDER — TOUJEO MAX SOLOSTAR 300 UNIT/ML ~~LOC~~ SOPN: 60.0000 [IU] | PEN_INJECTOR | Freq: Every day | SUBCUTANEOUS | 2 refills | Status: DC

## 2023-05-09 MED ORDER — BD PEN NEEDLE SHORT U/F 31G X 8 MM MISC: 1.0000 | Freq: Every day | 3 refills | Status: DC

## 2023-05-09 MED ORDER — EMPAGLIFLOZIN 10 MG PO TABS: 10.0000 mg | ORAL_TABLET | Freq: Every day | ORAL | Status: DC

## 2023-05-09 MED ORDER — DEXCOM G7 SENSOR MISC: 1.0000 | 3 refills | Status: DC

## 2023-05-09 NOTE — Telephone Encounter (Signed)
Please contact the patient and let him know that Toujeo 60 units which is his new insulin needs to be taken once DAILY and not weekly.   I noticed that on the AVS I would weekly but that was a typo  Thanks

## 2023-05-09 NOTE — Patient Instructions (Addendum)
Stop Novolin Start Toujeo 60 units once daily  Start Jardiance 10 mg , 1 tablet every morning    Choose healthy, lower carb lower calorie snacks: toss salad,  vegetables, cottage cheese, low fat cheese / string cheese, lower sodium deli meat, tuna salad or chicken salad    HOW TO TREAT LOW BLOOD SUGARS (Blood sugar LESS THAN 70 MG/DL) Please follow the RULE OF 15 for the treatment of hypoglycemia treatment (when your (blood sugars are less than 70 mg/dL)   STEP 1: Take 15 grams of carbohydrates when your blood sugar is low, which includes:  3-4 GLUCOSE TABS  OR 3-4 OZ OF JUICE OR REGULAR SODA OR ONE TUBE OF GLUCOSE GEL    STEP 2: RECHECK blood sugar in 15 MINUTES STEP 3: If your blood sugar is still low at the 15 minute recheck --> then, go back to STEP 1 and treat AGAIN with another 15 grams of carbohydrates.

## 2023-05-09 NOTE — Telephone Encounter (Signed)
According to [place] he understands the Toujeo 60 units daily, he is stating it will take the pharmacy 2 days to get it in, he is asking should he continue same meds unitl he receives the new medication, please advise?

## 2023-05-10 NOTE — Telephone Encounter (Signed)
Patient advise and will continue medication until he is able to get new meds.

## 2023-05-13 ENCOUNTER — Telehealth: Payer: Self-pay

## 2023-05-13 NOTE — Telephone Encounter (Signed)
Dexcom G7 Sensor CMM Key: [id number]

## 2023-06-03 ENCOUNTER — Encounter: Payer: Self-pay | Admitting: Internal Medicine

## 2023-06-03 ENCOUNTER — Ambulatory Visit (INDEPENDENT_AMBULATORY_CARE_PROVIDER_SITE_OTHER): Payer: BC Managed Care – PPO | Admitting: Internal Medicine

## 2023-06-03 VITALS — BP 118/76 | HR 64 | Ht 79.0 in | Wt 347.0 lb

## 2023-06-03 DIAGNOSIS — E1165 Type 2 diabetes mellitus with hyperglycemia: Secondary | ICD-10-CM | POA: Insufficient documentation

## 2023-06-03 DIAGNOSIS — E1142 Type 2 diabetes mellitus with diabetic polyneuropathy: Secondary | ICD-10-CM

## 2023-06-03 DIAGNOSIS — Z794 Long term (current) use of insulin: Secondary | ICD-10-CM | POA: Diagnosis not present

## 2023-06-03 LAB — POCT GLUCOSE (DEVICE FOR HOME USE): POC Glucose: 268 mg/dl — AB (ref 70–99)

## 2023-06-03 MED ORDER — INSULIN LISPRO (1 UNIT DIAL) 100 UNIT/ML (KWIKPEN): PEN_INJECTOR | SUBCUTANEOUS | 3 refills | Status: AC

## 2023-06-03 MED ORDER — TOUJEO MAX SOLOSTAR 300 UNIT/ML ~~LOC~~ SOPN: 70.0000 [IU] | PEN_INJECTOR | Freq: Every day | SUBCUTANEOUS | 4 refills | Status: AC

## 2023-06-03 MED ORDER — BD PEN NEEDLE SHORT U/F 31G X 8 MM MISC: 1.0000 | Freq: Four times a day (QID) | 3 refills | Status: AC

## 2023-06-03 MED ORDER — FREESTYLE LIBRE 3 SENSOR MISC: 1.0000 | 3 refills | Status: DC

## 2023-06-03 NOTE — Patient Instructions (Addendum)
Increase Toujeo 70 units once daily  Humalog 6 units with each meal (Not with a smoothie) Humalog correctional insulin: ADD extra units on insulin to your meal-time Humalog dose if your blood sugars are higher than 155. Use the scale below to help guide you:   Blood sugar before meal Number of units to inject  Less than 155 0 unit  156 -  180 1 units  181 -  205 2 units  206 -  230 3 units  231 -  255 4 units  256 -  280 5 units  281 -  305 6 units  306 -  330 7 units    HOW TO TREAT LOW BLOOD SUGARS (Blood sugar LESS THAN 70 MG/DL) Please follow the RULE OF 15 for the treatment of hypoglycemia treatment (when your (blood sugars are less than 70 mg/dL)   STEP 1: Take 15 grams of carbohydrates when your blood sugar is low, which includes:  3-4 GLUCOSE TABS  OR 3-4 OZ OF JUICE OR REGULAR SODA OR ONE TUBE OF GLUCOSE GEL    STEP 2: RECHECK blood sugar in 15 MINUTES STEP 3: If your blood sugar is still low at the 15 minute recheck --> then, go back to STEP 1 and treat AGAIN with another 15 grams of carbohydrates.       STEP 2: RECHECK blood sugar in 15 MINUTES STEP 3: If your blood sugar is still low at the 15 minute recheck --> then, go back to STEP 1 and treat AGAIN with another 15 grams of carbohydrates.

## 2023-06-03 NOTE — Progress Notes (Signed)
Name: Shawn Benson  MRN/ DOB: [id number], 05/26/1981   Age/ Sex: 42 y.o., male    PCP: Carin Hock, [place]   Reason for Endocrinology Evaluation: Type 2 Diabetes Mellitus     Date of Initial Endocrinology Visit: 05/09/2023    PATIENT IDENTIFIER: Shawn Benson is a 42 y.o. male with a past medical history of asthma, DM, OSA. The patient presented for initial endocrinology clinic visit on 05/09/2023 for consultative assistance with his diabetes management.    HPI: Shawn Benson was    Diagnosed with DM  8 yrs ago Prior Medications tried/Intolerance: Metformin- leg swelling , Ryebelsus - leg swelling. Jardiance - he opted not to take              Hemoglobin A1c has ranged from 7.6% in 2017, peaking at >15.5% in 2018.   Has gastroparesis , drinks smoothies for breakfast   Saw Dr. Roanna Raider 04/2023   On his initial visit to our clinic he had an A1c of 11.4%, he was on NPH twice daily.  I switched him to basal insulin and started Jardiance   SUBJECTIVE:   During the last visit (05/09/2023): A1c 11.4%  Today (06/03/23): Shawn Benson is here for follow-up on diabetes management.  He checks his blood sugars occasionally .    Insurance declined Dexcom as he is not on intensive insulin therapy  He did not start Jardiance , because his previous endocrinology's   Denies recent nausea or vomiting Denies constipation or diarrhea    HOME DIABETES REGIMEN: Toujeo 60 units daily Jardiance 10 mg daily- not taking     Statin: no ACE-I/ARB: no   METER DOWNLOAD SUMMARY: n/a    DIABETIC COMPLICATIONS: Microvascular complications:   Denies: CKD Last eye exam: Completed   Macrovascular complications:   Denies: CAD, PVD, CVA   PAST HISTORY: Past Medical History:  Past Medical History:  Diagnosis Date   Allergy    Seasonal--spring through fall.   Asthma childhood and adult   triggers are allergies and URI.  Spring, summer and fall.   Diabetes mellitus without  complication (HCC)    Morbid obesity (HCC)    Peripheral edema 04/09/2017   Past Surgical History:  Past Surgical History:  Procedure Laterality Date   INCISION AND DRAINAGE PERIRECTAL ABSCESS N/A 10/06/2015   Procedure: IRRIGATION AND DEBRIDEMENT PERIANAL  ABSCESS;  Surgeon: Ovidio Kin, MD;  Location: [facility];  Service: General;  Laterality: N/A;   KNEE SURGERY     Right and Left Knee   UPPER GASTROINTESTINAL ENDOSCOPY  01/08/2023    Social History:  reports that he has been smoking cigarettes. He has never used smokeless tobacco. He reports current alcohol use. He reports current drug use. Drug: Marijuana. Family History:  Family History  Problem Relation Age of Onset   Diabetes Mother    Diabetes Father    Diabetes Brother    Diabetes Brother    Colon cancer Neg Hx    Stomach cancer Neg Hx    Esophageal cancer Neg Hx      HOME MEDICATIONS: Allergies as of 06/03/2023       Reactions   Metformin And Related Swelling        Medication List        Accurate as of June 03, 2023  3:01 PM. If you have any questions, ask your nurse or doctor.          Accu-Chek Aviva Plus w/Device Kit Check blood sugar before meals  3 times daily   B-D ULTRAFINE III SHORT PEN 31G X 8 MM Misc Generic drug: Insulin Pen Needle 1 Device by Does not apply route daily in the afternoon.   clotrimazole 10 MG troche Commonly known as: MYCELEX SMARTSIG:1 Lozenge(s) By Mouth Twice Daily   Dexcom G7 Sensor Misc 1 Device by Does not apply route as directed.   empagliflozin 10 MG Tabs tablet Commonly known as: Jardiance Take 1 tablet (10 mg total) by mouth daily before breakfast.   empagliflozin 10 MG Tabs tablet Commonly known as: Jardiance Take 1 tablet (10 mg total) by mouth daily before breakfast.   furosemide 20 MG tablet Commonly known as: LASIX Take 20 mg by mouth daily.   gabapentin 300 MG capsule Commonly known as: NEURONTIN   Klor-Con 20 MEQ packet Generic drug:  potassium chloride Take 20 mEq by mouth daily.   metoCLOPramide 10 MG tablet Commonly known as: REGLAN Take 1 tablet (10 mg total) by mouth 3 (three) times daily with meals. What changed: when to take this   oxyCODONE 5 MG immediate release tablet Commonly known as: Roxicodone Take 1-2 tablets (5-10 mg total) by mouth every 6 (six) hours as needed for severe pain.   pantoprazole 40 MG tablet Commonly known as: PROTONIX Take 1 tablet (40 mg total) by mouth 2 (two) times daily before a meal.   Toujeo Max SoloStar 300 UNIT/ML Solostar Pen Generic drug: insulin glargine (2 Unit Dial) Inject 60 Units into the skin daily in the afternoon.         ALLERGIES: Allergies  Allergen Reactions   Metformin And Related Swelling     REVIEW OF SYSTEMS: A comprehensive ROS was conducted with the patient and is negative except as per HPI     OBJECTIVE:   VITAL SIGNS: There were no vitals taken for this visit.   PHYSICAL EXAM:  General: Pt appears well and is in NAD  Neck: General: Supple without adenopathy or carotid bruits. Thyroid: Thyroid size normal.  No goiter or nodules appreciated.   Lungs: Clear with good BS bilat   Heart: RRR   Abdomen:  soft, nontender  Extremities:  Lower extremities - trace pretibial edema.   Neuro: MS is good with appropriate affect, pt is alert and Ox3   Dm Foot Exam 06/03/2023  The skin of the feet is without sores or ulcerations. The pedal pulses are 1+ The sensation is Decreased  to a screening 5.07, 10 gram monofilament bilaterally   DATA REVIEWED:  Lab Results  Component Value Date   HGBA1C 11.2 (A) 05/09/2023   HGBA1C 12.9 (H) 11/26/2022   HGBA1C 12.9 (H) 06/28/2021    Latest Reference Range & Units 04/12/23 13:42  Sodium 135 - 145 mmol/L 141  Potassium 3.5 - 5.1 mmol/L 4.1  Chloride 98 - 111 mmol/L 105  CO2 22 - 32 mmol/L 27  Glucose 70 - 99 mg/dL 564 (H)  BUN 6 - 20 mg/dL 12  Creatinine 3.32 - 9.51 mg/dL 8.84  Calcium 8.9 -  16.6 mg/dL 9.5  Anion gap 5 - 15  9  GFR, Estimated >60 mL/min >60  (H): Data is abnormally high  In office BG 268 mg/dL     ASSESSMENT / PLAN / RECOMMENDATIONS:   1) Type 2 Diabetes Mellitus, Poorly controlled, With Neuropathic complications - Most recent A1c of 11.4 %. Goal A1c < 7.0 %.    -Patient continues with poorly controlled diabetes, no glucose data today, in office BG 268 mg/DL, patient  had candy before he came today?,  We again emphasized the importance of avoiding candy, sugar sweetened beverages and sweets in general -I have prescribed Jardiance but he tells me that he did not started because his previous endocrinologist told him to wait until his glucose is under better control before starting Jardiance?  -He endorsed lower extremity edema with Rybelsus and metformin -Patient is NOT a candidate for GLP-1 agonist due to gastroparesis -At this time only other option is to put him on prandial insulin with correction scale as below -A prescription for freestyle Josephine Igo has been sent -Emphasized the importance of taking Humalog immediately before the meal and not long before that to prevent hypoglycemia, I also explained correction factor and how to add to the 6 units of baseline prandial insulin with each meal  MEDICATIONS:  Increase Toujeo 70 units once daily Start Humalog 6 units 3 times daily before every meal Start CF : Humalog (BG -130/25) 3 times daily before every meal   EDUCATION / INSTRUCTIONS: BG monitoring instructions: Patient is instructed to check his blood sugars 1 times a day. Call [facility] if: BG persistently < 70  I reviewed the Rule of 15 for the treatment of hypoglycemia in detail with the patient. Literature supplied.   2) Diabetic complications:  Eye: Does not have known diabetic retinopathy.  Neuro/ Feet: Does not have known diabetic peripheral neuropathy. Renal: Patient does not have known baseline CKD. He is not on an ACEI/ARB  at present.    Follow-up in 3 months   Signed electronically by: Lyndle Herrlich, MD  [facility]  [facility] [address] [place], [place] [address] Phone: [phone] FAX: [phone]   CC: Carin Hock, PA [address] [place] [address] Phone: [phone]  Fax: [phone]    Return to Endocrinology clinic as below: Future Appointments  Date Time Provider Department Center  06/04/2023  4:15 PM Freddie Breech, DPM [facility]

## 2023-06-04 ENCOUNTER — Other Ambulatory Visit: Payer: Self-pay

## 2023-06-04 ENCOUNTER — Encounter: Payer: Self-pay | Admitting: Podiatry

## 2023-06-04 ENCOUNTER — Ambulatory Visit (INDEPENDENT_AMBULATORY_CARE_PROVIDER_SITE_OTHER): Payer: BC Managed Care – PPO | Admitting: Podiatry

## 2023-06-04 VITALS — BP 114/75 | HR 71

## 2023-06-04 DIAGNOSIS — E1142 Type 2 diabetes mellitus with diabetic polyneuropathy: Secondary | ICD-10-CM

## 2023-06-04 DIAGNOSIS — M79674 Pain in right toe(s): Secondary | ICD-10-CM | POA: Diagnosis not present

## 2023-06-04 DIAGNOSIS — B351 Tinea unguium: Secondary | ICD-10-CM | POA: Diagnosis not present

## 2023-06-04 DIAGNOSIS — M79675 Pain in left toe(s): Secondary | ICD-10-CM | POA: Diagnosis not present

## 2023-06-06 ENCOUNTER — Other Ambulatory Visit: Payer: Self-pay

## 2023-06-06 MED ORDER — DEXCOM G7 SENSOR MISC: 3 refills | Status: AC

## 2023-06-08 NOTE — Progress Notes (Signed)
  Subjective:  Patient ID: Shawn Benson, male    DOB: [birth year],  MRN: [id number]  Shawn P Levick presents to clinic today for at risk foot care with history of diabetic neuropathy and painful elongated mycotic toenails 1-5 bilaterally which are tender when wearing enclosed shoe gear. Pain is relieved with periodic professional debridement. Patient states he continues to have neuropathy pain in his feet. He is on gabapentin. Also states his A1c is 11.2%. Chief Complaint  Patient presents with   Nail Problem    [facility]FC/saw PCP about 1 month ago/A1C- 11.9   New problem(s): None.   PCP is Carin Hock, [place].  Allergies  Allergen Reactions   Metformin And Related Swelling    Review of Systems: Negative except as noted in the HPI.  Objective: No changes noted in today's physical examination. Vitals:   06/04/23 1648  BP: 114/75  Pulse: 71   Shawn P Muffler is a pleasant 42 y.o. male morbidly obese in NAD. AAO x 3.  Vascular Examination: CFT <3 seconds b/l. DP /PT pulses 1/4 bilaterally. Digital hair absent. Skin temperature gradient warm to warm b/l. No ischemia or gangrene. No cyanosis or clubbing noted b/l. No pain with calf compression. Trace edema noted BLE. Varicosities present b/l.   Neurological Examination: Pt has subjective symptoms of neuropathy. Protective sensation decreased with 10 gram monofilament b/l. Vibratory sensation diminished b/l.  Dermatological Examination: Pedal skin is warm and supple b/l LE. No open wounds b/l LE. No interdigital macerations noted b/l LE.   Toenails 1-5 b/l elongated, discolored, dystrophic, thickened, crumbly with subungual debris and tenderness to dorsal palpation.   No hyperkeratotic nor porokeratotic lesions present on today's visit.  Musculoskeletal Examination: Muscle strength 5/5 to b/l LE. Muscle strength 5/5 to all lower extremity muscle groups bilaterally. Pes planus deformity noted left lower extremity. Pes planovalgus  deformity noted right lower extremity. Patient ambulates independent of any assistive aids.  Radiographs: None  Lab Results  Component Value Date   HGBA1C 11.2 (A) 05/09/2023   Assessment/Plan: 1. Pain due to onychomycosis of toenails of both feet   2. Diabetic polyneuropathy associated with type 2 diabetes mellitus (HCC)     -Patient was evaluated and treated. All patient's and/or POA's questions/concerns answered on today's visit. -Discussed his neuropathy symptoms. Informed patient his elevated A1c is contributing to his neuropathy symptoms and he should speak with the prescribing physician regarding his neuropathy symptoms. -Patient to continue soft, supportive shoe gear daily. -Toenails 1-5 b/l were debrided in length and girth with sterile nail nippers and dremel without iatrogenic bleeding.  -Patient/POA to call should there be question/concern in the interim.   Return in about 3 months (around 09/04/2023).  Freddie Breech, DPM

## 2023-06-17 ENCOUNTER — Other Ambulatory Visit (HOSPITAL_COMMUNITY): Payer: Self-pay

## 2023-06-19 DIAGNOSIS — E1142 Type 2 diabetes mellitus with diabetic polyneuropathy: Secondary | ICD-10-CM | POA: Diagnosis not present

## 2023-06-19 DIAGNOSIS — N529 Male erectile dysfunction, unspecified: Secondary | ICD-10-CM | POA: Diagnosis not present

## 2023-06-19 DIAGNOSIS — E1165 Type 2 diabetes mellitus with hyperglycemia: Secondary | ICD-10-CM | POA: Diagnosis not present

## 2023-06-28 DIAGNOSIS — E1142 Type 2 diabetes mellitus with diabetic polyneuropathy: Secondary | ICD-10-CM | POA: Diagnosis not present

## 2023-06-28 DIAGNOSIS — E1165 Type 2 diabetes mellitus with hyperglycemia: Secondary | ICD-10-CM | POA: Diagnosis not present

## 2023-07-17 ENCOUNTER — Other Ambulatory Visit (HOSPITAL_COMMUNITY): Payer: Self-pay

## 2023-07-19 ENCOUNTER — Ambulatory Visit: Payer: BC Managed Care – PPO | Admitting: Internal Medicine

## 2023-09-04 ENCOUNTER — Ambulatory Visit: Payer: BC Managed Care – PPO | Admitting: Podiatry

## 2023-09-11 ENCOUNTER — Ambulatory Visit (INDEPENDENT_AMBULATORY_CARE_PROVIDER_SITE_OTHER): Payer: BC Managed Care – PPO | Admitting: Podiatry

## 2023-09-11 DIAGNOSIS — E559 Vitamin D deficiency, unspecified: Secondary | ICD-10-CM | POA: Diagnosis not present

## 2023-09-11 DIAGNOSIS — L03119 Cellulitis of unspecified part of limb: Secondary | ICD-10-CM | POA: Diagnosis not present

## 2023-09-11 DIAGNOSIS — E1142 Type 2 diabetes mellitus with diabetic polyneuropathy: Secondary | ICD-10-CM | POA: Diagnosis not present

## 2023-09-11 DIAGNOSIS — Z91199 Patient's noncompliance with other medical treatment and regimen due to unspecified reason: Secondary | ICD-10-CM

## 2023-09-11 DIAGNOSIS — E1165 Type 2 diabetes mellitus with hyperglycemia: Secondary | ICD-10-CM | POA: Diagnosis not present

## 2023-09-11 NOTE — Progress Notes (Signed)
No show

## 2023-09-18 DIAGNOSIS — L089 Local infection of the skin and subcutaneous tissue, unspecified: Secondary | ICD-10-CM | POA: Diagnosis not present

## 2023-09-18 DIAGNOSIS — L02416 Cutaneous abscess of left lower limb: Secondary | ICD-10-CM | POA: Diagnosis not present

## 2023-09-25 ENCOUNTER — Ambulatory Visit: Payer: BC Managed Care – PPO | Admitting: Internal Medicine

## 2023-09-25 NOTE — Progress Notes (Deleted)
Name: Shawn Benson  MRN/ DOB: [id number], 1980/10/10   Age/ Sex: 42 y.o., male    PCP: Carin Hock, [place]   Reason for Endocrinology Evaluation: Type 2 Diabetes Mellitus     Date of Initial Endocrinology Visit: 05/09/2023    PATIENT IDENTIFIER: [name] is a 42 y.o. male with a past medical history of asthma, DM, OSA. The patient presented for initial endocrinology clinic visit on 05/09/2023 for consultative assistance with his diabetes management.    HPI: [name] was    Diagnosed with DM  8 yrs ago Prior Medications tried/Intolerance: Metformin- leg swelling , Ryebelsus - leg swelling. Jardiance - he opted not to take              Hemoglobin A1c has ranged from 7.6% in 2017, peaking at >15.5% in 2018.   Has gastroparesis , drinks smoothies for breakfast   Saw Dr. Roanna Raider 04/2023   On his initial visit to our clinic he had an A1c of 11.4%, he was on NPH twice daily.  I switched him to basal insulin and started Jardiance   SUBJECTIVE:   During the last visit (06/03/2023): A1c 11.4%  Today (09/25/23): Shawn Benson [name] is here for follow-up on diabetes management.  He checks his blood sugars occasionally .    Insurance declined Dexcom as he is not on intensive insulin therapy  He did not start Jardiance , because his previous endocrinology's   Denies recent nausea or vomiting Denies constipation or diarrhea    HOME DIABETES REGIMEN: Toujeo 70 units daily Humalog 6 units TIDQAC CF: Humalog (BG -130/25)    Statin: no ACE-I/ARB: no   METER DOWNLOAD SUMMARY: n/a    DIABETIC COMPLICATIONS: Microvascular complications:   Denies: CKD Last eye exam: Completed   Macrovascular complications:   Denies: CAD, PVD, CVA   PAST HISTORY: Past Medical History:  Past Medical History:  Diagnosis Date   Allergy    Seasonal--spring through fall.   Asthma childhood and adult   triggers are allergies and URI.  Spring, summer and fall.   Diabetes  mellitus without complication (HCC)    Morbid obesity (HCC)    Peripheral edema 04/09/2017   Past Surgical History:  Past Surgical History:  Procedure Laterality Date   INCISION AND DRAINAGE PERIRECTAL ABSCESS N/A 10/06/2015   Procedure: IRRIGATION AND DEBRIDEMENT PERIANAL  ABSCESS;  Surgeon: Ovidio Kin, MD;  Location: [facility];  Service: General;  Laterality: N/A;   KNEE SURGERY     Right and Left Knee   UPPER GASTROINTESTINAL ENDOSCOPY  01/08/2023    Social History:  reports that he has been smoking cigarettes. He has never used smokeless tobacco. He reports current alcohol use. He reports current drug use. Drug: Marijuana. Family History:  Family History  Problem Relation Age of Onset   Diabetes Mother    Diabetes Father    Diabetes Brother    Diabetes Brother    Colon cancer Neg Hx    Stomach cancer Neg Hx    Esophageal cancer Neg Hx      HOME MEDICATIONS: Allergies as of 09/25/2023       Reactions   Metformin And Related Swelling        Medication List        Accurate as of September 25, 2023 12:08 PM. If you have any questions, ask your nurse or doctor.          Accu-Chek Aviva Plus w/Device Kit Check blood sugar before meals  3 times daily   B-D ULTRAFINE III SHORT PEN 31G X 8 MM Misc Generic drug: Insulin Pen Needle 1 Device by Does not apply route in the morning, at noon, in the evening, and at bedtime.   Dexcom G7 Sensor Misc Change sensors every 10 days   gabapentin 300 MG capsule Commonly known as: NEURONTIN   insulin lispro 100 UNIT/ML KwikPen Commonly known as: HumaLOG KwikPen Max daily 30 units   metoCLOPramide 10 MG tablet Commonly known as: REGLAN Take 1 tablet (10 mg total) by mouth 3 (three) times daily with meals. What changed: when to take this   pantoprazole 40 MG tablet Commonly known as: PROTONIX Take 1 tablet (40 mg total) by mouth 2 (two) times daily before a meal.   Toujeo Max SoloStar 300 UNIT/ML Solostar Pen Generic  drug: insulin glargine (2 Unit Dial) Inject 70 Units into the skin daily in the afternoon.         ALLERGIES: Allergies  Allergen Reactions   Metformin And Related Swelling     REVIEW OF SYSTEMS: A comprehensive ROS was conducted with the patient and is negative except as per HPI     OBJECTIVE:   VITAL SIGNS: There were no vitals taken for this visit.   PHYSICAL EXAM:  General: Pt appears well and is in NAD  Neck: General: Supple without adenopathy or carotid bruits. Thyroid: Thyroid size normal.  No goiter or nodules appreciated.   Lungs: Clear with good BS bilat   Heart: RRR   Abdomen:  soft, nontender  Extremities:  Lower extremities - trace pretibial edema.   Neuro: MS is good with appropriate affect, pt is alert and Ox3   Dm Foot Exam 06/03/2023  The skin of the feet is without sores or ulcerations. The pedal pulses are 1+ The sensation is Decreased  to a screening 5.07, 10 gram monofilament bilaterally   DATA REVIEWED:  Lab Results  Component Value Date   HGBA1C 11.2 (A) 05/09/2023   HGBA1C 12.9 (H) 11/26/2022   HGBA1C 12.9 (H) 06/28/2021    Latest Reference Range & Units 04/12/23 13:42  Sodium 135 - 145 mmol/L 141  Potassium 3.5 - 5.1 mmol/L 4.1  Chloride 98 - 111 mmol/L 105  CO2 22 - 32 mmol/L 27  Glucose 70 - 99 mg/dL 161 (H)  BUN 6 - 20 mg/dL 12  Creatinine 0.96 - 0.45 mg/dL 4.09  Calcium 8.9 - 81.1 mg/dL 9.5  Anion gap 5 - 15  9  GFR, Estimated >60 mL/min >60  (H): Data is abnormally high  In office BG 268 mg/dL     ASSESSMENT / PLAN / RECOMMENDATIONS:   1) Type 2 Diabetes Mellitus, Poorly controlled, With Neuropathic complications - Most recent A1c of 11.4 %. Goal A1c < 7.0 %.    -Patient continues with poorly controlled diabetes, no glucose data today, in office BG 268 mg/DL, patient had candy before he came today?,  We again emphasized the importance of avoiding candy, sugar sweetened beverages and sweets in general -I have  prescribed Jardiance but he tells me that he did not started because his previous endocrinologist told him to wait until his glucose is under better control before starting Jardiance?  -He endorsed lower extremity edema with Rybelsus and metformin -Patient is NOT a candidate for GLP-1 agonist due to gastroparesis -At this time only other option is to put him on prandial insulin with correction scale as below -A prescription for freestyle Josephine Igo has been sent -Emphasized the  importance of taking Humalog immediately before the meal and not long before that to prevent hypoglycemia, I also explained correction factor and how to add to the 6 units of baseline prandial insulin with each meal  MEDICATIONS:  Increase Toujeo 70 units once daily Start Humalog 6 units 3 times daily before every meal Start CF : Humalog (BG -130/25) 3 times daily before every meal   EDUCATION / INSTRUCTIONS: BG monitoring instructions: Patient is instructed to check his blood sugars 1 times a day. Call [facility] if: BG persistently < 70  I reviewed the Rule of 15 for the treatment of hypoglycemia in detail with the patient. Literature supplied.   2) Diabetic complications:  Eye: Does not have known diabetic retinopathy.  Neuro/ Feet: Does not have known diabetic peripheral neuropathy. Renal: Patient does not have known baseline CKD. He is not on an ACEI/ARB at present.    Follow-up in 3 months   Signed electronically by: Lyndle Herrlich, MD  [facility]  [facility] [address] [place], [place] [address] Phone: [phone] FAX: [phone]   CC: Carin Hock, [place] [address] [place] [address] Phone: [phone]  Fax: [phone]    Return to Endocrinology clinic as below: Future Appointments  Date Time Provider Department Center  09/25/2023  2:40 PM Mahira Petras, Konrad Dolores, MD [facility] None  12/04/2023  4:00 PM  Freddie Breech, DPM [facility]

## 2023-09-26 ENCOUNTER — Telehealth: Payer: Self-pay

## 2023-09-26 NOTE — Telephone Encounter (Signed)
Patient left a vm stating that he is seeing another provider and that why he was not at the appointment yesterday.

## 2023-10-24 DIAGNOSIS — Z0000 Encounter for general adult medical examination without abnormal findings: Secondary | ICD-10-CM | POA: Diagnosis not present

## 2023-10-24 DIAGNOSIS — Z23 Encounter for immunization: Secondary | ICD-10-CM | POA: Diagnosis not present

## 2023-10-24 DIAGNOSIS — E1165 Type 2 diabetes mellitus with hyperglycemia: Secondary | ICD-10-CM | POA: Diagnosis not present

## 2023-12-04 ENCOUNTER — Ambulatory Visit (INDEPENDENT_AMBULATORY_CARE_PROVIDER_SITE_OTHER): Payer: BC Managed Care – PPO | Admitting: Podiatry

## 2023-12-04 DIAGNOSIS — Z91198 Patient's noncompliance with other medical treatment and regimen for other reason: Secondary | ICD-10-CM

## 2023-12-05 NOTE — Progress Notes (Signed)
 1. Failure to attend appointment with reason given    Patient canceled appointment stating toes are okay for now.

## 2024-01-05 ENCOUNTER — Other Ambulatory Visit: Payer: Self-pay

## 2024-01-05 ENCOUNTER — Emergency Department (HOSPITAL_COMMUNITY)
Admission: EM | Admit: 2024-01-05 | Discharge: 2024-01-06 | Disposition: A | Discharge status: Home / Self Care | Attending: Emergency Medicine | Admitting: Emergency Medicine

## 2024-01-05 DIAGNOSIS — I10 Essential (primary) hypertension: Secondary | ICD-10-CM | POA: Diagnosis not present

## 2024-01-05 DIAGNOSIS — E1165 Type 2 diabetes mellitus with hyperglycemia: Secondary | ICD-10-CM | POA: Insufficient documentation

## 2024-01-05 DIAGNOSIS — E1143 Type 2 diabetes mellitus with diabetic autonomic (poly)neuropathy: Secondary | ICD-10-CM | POA: Insufficient documentation

## 2024-01-05 DIAGNOSIS — Z794 Long term (current) use of insulin: Secondary | ICD-10-CM | POA: Diagnosis not present

## 2024-01-05 DIAGNOSIS — R112 Nausea with vomiting, unspecified: Secondary | ICD-10-CM | POA: Diagnosis not present

## 2024-01-05 DIAGNOSIS — I959 Hypotension, unspecified: Secondary | ICD-10-CM | POA: Diagnosis not present

## 2024-01-05 DIAGNOSIS — R739 Hyperglycemia, unspecified: Secondary | ICD-10-CM

## 2024-01-05 DIAGNOSIS — D72829 Elevated white blood cell count, unspecified: Secondary | ICD-10-CM | POA: Insufficient documentation

## 2024-01-05 DIAGNOSIS — R001 Bradycardia, unspecified: Secondary | ICD-10-CM | POA: Diagnosis not present

## 2024-01-05 LAB — URINALYSIS, ROUTINE W REFLEX MICROSCOPIC
Bacteria, UA: NONE SEEN
Bilirubin Urine: NEGATIVE
Glucose, UA: >=500 mg/dL — AB
Hgb urine dipstick: NEGATIVE
Ketones, ur: 80 mg/dL — AB
Leukocytes,Ua: NEGATIVE
Nitrite: NEGATIVE
Protein, ur: 30 mg/dL — AB
Specific Gravity, Urine: 1.035 — ABNORMAL HIGH (ref 1.005–1.030)
pH: 5 (ref 5.0–8.0)

## 2024-01-05 LAB — CBG MONITORING, ED
Glucose-Capillary: 374 mg/dL — ABNORMAL HIGH (ref 70–99)
Glucose-Capillary: 404 mg/dL — ABNORMAL HIGH (ref 70–99)
Glucose-Capillary: 385 mg/dL — ABNORMAL HIGH (ref 70–99)

## 2024-01-05 LAB — COMPREHENSIVE METABOLIC PANEL WITH GFR
ALT: 12 U/L (ref 0–44)
AST: 10 U/L — ABNORMAL LOW (ref 15–41)
Albumin: 3.9 g/dL (ref 3.5–5.0)
Alkaline Phosphatase: 100 U/L (ref 38–126)
Anion gap: 13 (ref 5–15)
BUN: 13 mg/dL (ref 6–20)
CO2: 22 mmol/L (ref 22–32)
Calcium: 9.1 mg/dL (ref 8.9–10.3)
Chloride: 104 mmol/L (ref 98–111)
Creatinine, Ser: 0.76 mg/dL (ref 0.61–1.24)
GFR, Estimated: >60 mL/min (ref >60)
Glucose, Bld: 449 mg/dL — ABNORMAL HIGH (ref 70–99)
Potassium: 3.8 mmol/L (ref 3.5–5.1)
Sodium: 139 mmol/L (ref 135–145)
Total Bilirubin: 0.8 mg/dL (ref 0.0–1.2)
Total Protein: 7.5 g/dL (ref 6.5–8.1)

## 2024-01-05 LAB — LIPASE, BLOOD: Lipase: 23 U/L (ref 11–51)

## 2024-01-05 LAB — I-STAT CHEM 8, ED
BUN: 12 mg/dL (ref 6–20)
Calcium, Ion: 1.13 mmol/L — ABNORMAL LOW (ref 1.15–1.40)
Chloride: 105 mmol/L (ref 98–111)
Creatinine, Ser: 0.7 mg/dL (ref 0.61–1.24)
Glucose, Bld: 423 mg/dL — ABNORMAL HIGH (ref 70–99)
HCT: 44 % (ref 39.0–52.0)
Hemoglobin: 15 g/dL (ref 13.0–17.0)
Potassium: 3.8 mmol/L (ref 3.5–5.1)
Sodium: 141 mmol/L (ref 135–145)
TCO2: 23 mmol/L (ref 22–32)

## 2024-01-05 LAB — BLOOD GAS, VENOUS
Acid-base deficit: 0.4 mmol/L (ref 0.0–2.0)
Bicarbonate: 24.8 mmol/L (ref 20.0–28.0)
O2 Saturation: 39.3 %
Patient temperature: 37
pCO2, Ven: 42 mmHg — ABNORMAL LOW (ref 44–60)
pH, Ven: 7.38 (ref 7.25–7.43)
pO2, Ven: <31 mmHg — CL (ref 32–45)

## 2024-01-05 LAB — CBC
HCT: 44.9 % (ref 39.0–52.0)
Hemoglobin: 14.6 g/dL (ref 13.0–17.0)
MCH: 25.7 pg — ABNORMAL LOW (ref 26.0–34.0)
MCHC: 32.5 g/dL (ref 30.0–36.0)
MCV: 79.2 fL — ABNORMAL LOW (ref 80.0–100.0)
Platelets: 262 10*3/uL (ref 150–400)
RBC: 5.67 MIL/uL (ref 4.22–5.81)
RDW: 14.3 % (ref 11.5–15.5)
WBC: 13.8 10*3/uL — ABNORMAL HIGH (ref 4.0–10.5)
nRBC: 0 % (ref 0.0–0.2)

## 2024-01-05 MED ORDER — DROPERIDOL 2.5 MG/ML IJ SOLN
2.5000 mg | Freq: Once | INTRAMUSCULAR | Status: AC
  Administered 2024-01-05: 2.5 mg via INTRAVENOUS
  Filled 2024-01-05: qty 2

## 2024-01-05 MED ORDER — PROMETHAZINE HCL 25 MG RE SUPP: 25.0000 mg | Freq: Four times a day (QID) | RECTAL | 0 refills | Status: DC | PRN

## 2024-01-05 MED ORDER — FAMOTIDINE IN NACL 20-0.9 MG/50ML-% IV SOLN
20.0000 mg | Freq: Once | INTRAVENOUS | Status: AC
  Administered 2024-01-05: 20 mg via INTRAVENOUS
  Filled 2024-01-05: qty 50

## 2024-01-05 MED ORDER — INSULIN ASPART 100 UNIT/ML IJ SOLN
6.0000 [IU] | Freq: Once | INTRAMUSCULAR | Status: AC
  Administered 2024-01-05: 6 [IU] via SUBCUTANEOUS
  Filled 2024-01-05: qty 0.06

## 2024-01-05 MED ORDER — PROMETHAZINE HCL 25 MG PO TABS
12.5000 mg | ORAL_TABLET | Freq: Once | ORAL | Status: AC
  Administered 2024-01-05: 12.5 mg via ORAL
  Filled 2024-01-05: qty 1

## 2024-01-05 MED ORDER — SODIUM CHLORIDE 0.9 % IV BOLUS
1000.0000 mL | Freq: Once | INTRAVENOUS | Status: AC
  Administered 2024-01-05: 1000 mL via INTRAVENOUS

## 2024-01-05 MED ORDER — ONDANSETRON 4 MG PO TBDP: 4.0000 mg | ORAL_TABLET | Freq: Three times a day (TID) | ORAL | 0 refills | Status: DC | PRN

## 2024-01-05 NOTE — Discharge Instructions (Addendum)
 Please continue taking your normal home insulin medications, including tonight, for your blood sugars.

## 2024-01-05 NOTE — ED Triage Notes (Signed)
 Pt BIBA from home. C/o N/V, and lower abd pain since this AM. CBG was 625, pt states they have not been able to take their insulin for over 2x weeks d/t money issues.   C/o increased thirst and urination Aox4  Given 200 NS

## 2024-01-05 NOTE — ED Provider Notes (Signed)
 [place] EMERGENCY DEPARTMENT AT [facility] Provider Note   CSN: [id number] Arrival date & time: 01/05/24  1734     History  Chief Complaint  Patient presents with   Hyperglycemia   Abdominal Pain   Emesis    Shawn Benson is a 43 y.o. male with history of diabetes, gastroparesis, regular marijuana usage, presented to ED with abdominal pain and emesis.  Patient reports onset of symptoms this morning around 5 AM.  He is a persistent vomiting and acute on any fluids or water.  This feels similar to prior episodes he has had, including 1 year ago.  He states he was told that marijuana could be a trigger for cyclical vomiting but "I just don't believe it."  He was seen by GI as an outpatient and has had a gastric emptying study which shows potential signs of gastroparesis slightly delayed emptying.  He had a normal CT of the abdomen performed 1 year ago with no other clear cause for his vomiting.  HPI     Home Medications Prior to Admission medications   Medication Sig Start Date End Date Taking? Authorizing Provider  ondansetron (ZOFRAN-ODT) 4 MG disintegrating tablet Take 1 tablet (4 mg total) by mouth every 8 (eight) hours as needed for up to 15 doses for nausea or vomiting. 01/05/24  Yes Rafel Garde, Kermit Balo, MD  promethazine (PHENERGAN) 25 MG suppository Place 1 suppository (25 mg total) rectally every 6 (six) hours as needed for up to 6 doses for refractory nausea / vomiting. 01/05/24  Yes Jaelah Hauth, Kermit Balo, MD  Blood Glucose Monitoring Suppl (ACCU-CHEK AVIVA PLUS) w/Device KIT Check blood sugar before meals 3 times daily 09/16/20   Julieanne Manson, MD  Continuous Glucose Sensor (DEXCOM G7 SENSOR) MISC Change sensors every 10 days 06/06/23   Shamleffer, Konrad Dolores, MD  gabapentin (NEURONTIN) 300 MG capsule  12/11/22   [provider]  insulin glargine, 2 Unit Dial, (TOUJEO MAX SOLOSTAR) 300 UNIT/ML Solostar Pen Inject 70 Units into the skin daily in the  afternoon. 06/03/23   Shamleffer, Konrad Dolores, MD  insulin lispro (HUMALOG KWIKPEN) 100 UNIT/ML KwikPen Max daily 30 units 06/03/23   Shamleffer, Konrad Dolores, MD  Insulin Pen Needle (B-D ULTRAFINE III SHORT PEN) 31G X 8 MM MISC 1 Device by Does not apply route in the morning, at noon, in the evening, and at bedtime. 06/03/23   Shamleffer, Konrad Dolores, MD  metoCLOPramide (REGLAN) 10 MG tablet Take 1 tablet (10 mg total) by mouth 3 (three) times daily with meals. Patient taking differently: Take 10 mg by mouth 2 (two) times daily. 11/30/22 02/28/23  Noralee Stain, DO  pantoprazole (PROTONIX) 40 MG tablet Take 1 tablet (40 mg total) by mouth 2 (two) times daily before a meal. 11/30/22 05/09/23  Noralee Stain, DO      Allergies    Metformin and related and Other    Review of Systems   Review of Systems  Physical Exam Updated Vital Signs BP 114/62   Pulse 68   Temp 98.3 F (36.8 C) (Oral)   Resp 18   Ht 6\' 7"  (2.007 m)   Wt (!) 154.2 kg   SpO2 98%   BMI 38.30 kg/m  Physical Exam Constitutional:      General: He is not in acute distress.    Appearance: He is obese.  HENT:     Head: Normocephalic and atraumatic.  Eyes:     Conjunctiva/sclera: Conjunctivae normal.     Pupils:  Pupils are equal, round, and reactive to light.  Cardiovascular:     Rate and Rhythm: Normal rate and regular rhythm.  Pulmonary:     Effort: Pulmonary effort is normal. No respiratory distress.  Abdominal:     General: There is no distension.     Tenderness: There is generalized abdominal tenderness.  Skin:    General: Skin is warm and dry.  Neurological:     General: No focal deficit present.     Mental Status: He is alert. Mental status is at baseline.  Psychiatric:        Mood and Affect: Mood normal.        Behavior: Behavior normal.     ED Results / Procedures / Treatments   Labs (all labs ordered are listed, but only abnormal results are displayed) Labs Reviewed  CBC - Abnormal;  Notable for the following components:      Result Value   WBC 13.8 (*)    MCV 79.2 (*)    MCH 25.7 (*)    All other components within normal limits  URINALYSIS, ROUTINE W REFLEX MICROSCOPIC - Abnormal; Notable for the following components:   Color, Urine STRAW (*)    Specific Gravity, Urine 1.035 (*)    Glucose, UA >=500 (*)    Ketones, ur 80 (*)    Protein, ur 30 (*)    All other components within normal limits  BLOOD GAS, VENOUS - Abnormal; Notable for the following components:   pCO2, Ven 42 (*)    pO2, Ven <31 (*)    All other components within normal limits  COMPREHENSIVE METABOLIC PANEL WITH GFR - Abnormal; Notable for the following components:   Glucose, Bld 449 (*)    AST 10 (*)    All other components within normal limits  CBG MONITORING, ED - Abnormal; Notable for the following components:   Glucose-Capillary 374 (*)    All other components within normal limits  CBG MONITORING, ED - Abnormal; Notable for the following components:   Glucose-Capillary 404 (*)    All other components within normal limits  I-STAT CHEM 8, ED - Abnormal; Notable for the following components:   Glucose, Bld 423 (*)    Calcium, Ion 1.13 (*)    All other components within normal limits  CBG MONITORING, ED - Abnormal; Notable for the following components:   Glucose-Capillary 385 (*)    All other components within normal limits  LIPASE, BLOOD    EKG EKG Interpretation Date/Time:  Sunday January 05 2024 17:48:49 EDT Ventricular Rate:  49 PR Interval:  169 QRS Duration:  98 QT Interval:  492 QTC Calculation: 445 R Axis:   58  Text Interpretation: Sinus bradycardia Confirmed by Alvester Chou [phone]) on 01/05/2024 6:13:35 PM  Radiology No results found.  Procedures Procedures    Medications Ordered in ED Medications  sodium chloride 0.9 % bolus 1,000 mL (0 mLs Intravenous Stopped 01/05/24 2329)  droperidol (INAPSINE) 2.5 MG/ML injection 2.5 mg (2.5 mg Intravenous Given 01/05/24  1831)  famotidine (PEPCID) IVPB 20 mg premix (0 mg Intravenous Stopped 01/05/24 1920)  insulin aspart (novoLOG) injection 6 Units (6 Units Subcutaneous Given 01/05/24 1934)  promethazine (PHENERGAN) tablet 12.5 mg (12.5 mg Oral Given 01/05/24 2328)    ED Course/ Medical Decision Making/ A&P Clinical Course as of 01/05/24 2346  Sun Jan 05, 2024  1836 No acidosis on VBG, bicarb wnl.  UA with +ketones and protein. [MT]  2322 Pt tolerating PO and wanting to  go home.  Advised to resume his normal insulin medications at home. [MT]    Clinical Course User Index [MT] Dustyn Dansereau, Kermit Balo, MD                                 Medical Decision Making Amount and/or Complexity of Data Reviewed Labs: ordered.  Risk Prescription drug management.   This patient presents to the ED with concern for nausea, vomiting, epigastric pain. This involves an extensive number of treatment options, and is a complaint that carries with it a high risk of complications and morbidity.  The differential diagnosis includes vomiting versus cannabis hyperemesis syndrome versus gastroparesis versus gastritis versus pancreatitis versus other  Co-morbidities that complicate the patient evaluation: History of diabetes and marijuana use, at risk of cyclical vomiting and metabolic derangement  External records from outside source obtained and reviewed including GI outpatient records  I ordered and personally interpreted labs.  The pertinent results include: Hyperglycemia, no anion gap, no acidosis, ketones in the urine.  The patient was maintained on a cardiac monitor.  I personally viewed and interpreted the cardiac monitored which showed an underlying rhythm of: Sinus rhythm and sinus bradycardia  Per my interpretation the patient's ECG shows sinus bradycardia, mildly prolonged QTc  I ordered medication including IV fluids, antiemetics, insulin for hyperglycemia and gastroparesis  I have reviewed the patients home medicines  and have made adjustments as needed  Test Considered: Very low suspicion for acute intra-abdominal surgical or inflammatory emergency to require CT imaging at this time.  I suspect this likely gastric pain.   After the interventions noted above, I reevaluated the patient and found that they have: improved  Social Determinants of Health: patient strongly discouraged from using marijuana, as explained this can be a trigger for cannabis hyperemesis syndrome  Dispostion:  After consideration of the diagnostic results and the patients response to treatment, I feel that the patent would benefit from close outpatient follow-up         Final Clinical Impression(s) / ED Diagnoses Final diagnoses:  Hyperglycemia  Nausea and vomiting, unspecified vomiting type    Rx / DC Orders ED Discharge Orders          Ordered    ondansetron (ZOFRAN-ODT) 4 MG disintegrating tablet  Every 8 hours PRN        01/05/24 2323    promethazine (PHENERGAN) 25 MG suppository  Every 6 hours PRN        01/05/24 2323              Terald Sleeper, MD 01/05/24 2347

## 2024-01-05 NOTE — ED Notes (Addendum)
 Pt. CBG 385, RN made aware.

## 2024-01-06 NOTE — ED Notes (Signed)
 Patient allowed to rest and see if he is able to drink a little more. Patient has drunk a little more and is not being d/c'd

## 2024-01-07 ENCOUNTER — Emergency Department (HOSPITAL_COMMUNITY)

## 2024-01-07 ENCOUNTER — Other Ambulatory Visit: Payer: Self-pay

## 2024-01-07 ENCOUNTER — Encounter (HOSPITAL_COMMUNITY): Payer: Self-pay

## 2024-01-07 ENCOUNTER — Inpatient Hospital Stay (HOSPITAL_COMMUNITY)
Admission: EM | Admit: 2024-01-07 | Discharge: 2024-01-14 | DRG: 074 | Disposition: A | Discharge status: Home / Self Care | Attending: Internal Medicine | Admitting: Internal Medicine

## 2024-01-07 DIAGNOSIS — K209 Esophagitis, unspecified without bleeding: Secondary | ICD-10-CM

## 2024-01-07 DIAGNOSIS — E1165 Type 2 diabetes mellitus with hyperglycemia: Secondary | ICD-10-CM

## 2024-01-07 DIAGNOSIS — Z833 Family history of diabetes mellitus: Secondary | ICD-10-CM | POA: Diagnosis not present

## 2024-01-07 DIAGNOSIS — R0789 Other chest pain: Secondary | ICD-10-CM | POA: Diagnosis not present

## 2024-01-07 DIAGNOSIS — E2740 Unspecified adrenocortical insufficiency: Secondary | ICD-10-CM | POA: Diagnosis not present

## 2024-01-07 DIAGNOSIS — R109 Unspecified abdominal pain: Secondary | ICD-10-CM | POA: Diagnosis not present

## 2024-01-07 DIAGNOSIS — E860 Dehydration: Secondary | ICD-10-CM | POA: Diagnosis present

## 2024-01-07 DIAGNOSIS — F1290 Cannabis use, unspecified, uncomplicated: Secondary | ICD-10-CM | POA: Diagnosis not present

## 2024-01-07 DIAGNOSIS — R079 Chest pain, unspecified: Secondary | ICD-10-CM | POA: Diagnosis not present

## 2024-01-07 DIAGNOSIS — B379 Candidiasis, unspecified: Secondary | ICD-10-CM | POA: Diagnosis present

## 2024-01-07 DIAGNOSIS — K3184 Gastroparesis: Secondary | ICD-10-CM | POA: Diagnosis present

## 2024-01-07 DIAGNOSIS — K2100 Gastro-esophageal reflux disease with esophagitis, without bleeding: Secondary | ICD-10-CM | POA: Diagnosis present

## 2024-01-07 DIAGNOSIS — F17210 Nicotine dependence, cigarettes, uncomplicated: Secondary | ICD-10-CM | POA: Diagnosis present

## 2024-01-07 DIAGNOSIS — E1143 Type 2 diabetes mellitus with diabetic autonomic (poly)neuropathy: Secondary | ICD-10-CM | POA: Diagnosis not present

## 2024-01-07 DIAGNOSIS — K30 Functional dyspepsia: Secondary | ICD-10-CM | POA: Diagnosis present

## 2024-01-07 DIAGNOSIS — Z79899 Other long term (current) drug therapy: Secondary | ICD-10-CM

## 2024-01-07 DIAGNOSIS — Z794 Long term (current) use of insulin: Secondary | ICD-10-CM

## 2024-01-07 DIAGNOSIS — F1210 Cannabis abuse, uncomplicated: Secondary | ICD-10-CM | POA: Diagnosis present

## 2024-01-07 DIAGNOSIS — Z888 Allergy status to other drugs, medicaments and biological substances status: Secondary | ICD-10-CM

## 2024-01-07 DIAGNOSIS — Z6835 Body mass index (BMI) 35.0-35.9, adult: Secondary | ICD-10-CM

## 2024-01-07 DIAGNOSIS — R030 Elevated blood-pressure reading, without diagnosis of hypertension: Secondary | ICD-10-CM | POA: Diagnosis present

## 2024-01-07 DIAGNOSIS — E876 Hypokalemia: Secondary | ICD-10-CM | POA: Diagnosis not present

## 2024-01-07 DIAGNOSIS — R531 Weakness: Secondary | ICD-10-CM | POA: Diagnosis not present

## 2024-01-07 DIAGNOSIS — K5900 Constipation, unspecified: Secondary | ICD-10-CM | POA: Diagnosis present

## 2024-01-07 DIAGNOSIS — R1110 Vomiting, unspecified: Secondary | ICD-10-CM | POA: Diagnosis not present

## 2024-01-07 DIAGNOSIS — J45909 Unspecified asthma, uncomplicated: Secondary | ICD-10-CM | POA: Diagnosis present

## 2024-01-07 DIAGNOSIS — R112 Nausea with vomiting, unspecified: Secondary | ICD-10-CM | POA: Diagnosis not present

## 2024-01-07 DIAGNOSIS — E119 Type 2 diabetes mellitus without complications: Secondary | ICD-10-CM

## 2024-01-07 DIAGNOSIS — R1310 Dysphagia, unspecified: Secondary | ICD-10-CM | POA: Diagnosis present

## 2024-01-07 DIAGNOSIS — R933 Abnormal findings on diagnostic imaging of other parts of digestive tract: Secondary | ICD-10-CM | POA: Diagnosis not present

## 2024-01-07 DIAGNOSIS — R739 Hyperglycemia, unspecified: Secondary | ICD-10-CM | POA: Diagnosis not present

## 2024-01-07 DIAGNOSIS — E1110 Type 2 diabetes mellitus with ketoacidosis without coma: Secondary | ICD-10-CM | POA: Diagnosis present

## 2024-01-07 DIAGNOSIS — Z8719 Personal history of other diseases of the digestive system: Secondary | ICD-10-CM | POA: Diagnosis not present

## 2024-01-07 DIAGNOSIS — E1310 Other specified diabetes mellitus with ketoacidosis without coma: Secondary | ICD-10-CM | POA: Diagnosis not present

## 2024-01-07 LAB — BASIC METABOLIC PANEL WITH GFR
Anion gap: 18 — ABNORMAL HIGH (ref 5–15)
Anion gap: 16 — ABNORMAL HIGH (ref 5–15)
Anion gap: 12 (ref 5–15)
BUN: 25 mg/dL — ABNORMAL HIGH (ref 6–20)
BUN: 24 mg/dL — ABNORMAL HIGH (ref 6–20)
BUN: 22 mg/dL — ABNORMAL HIGH (ref 6–20)
CO2: 20 mmol/L — ABNORMAL LOW (ref 22–32)
CO2: 20 mmol/L — ABNORMAL LOW (ref 22–32)
CO2: 21 mmol/L — ABNORMAL LOW (ref 22–32)
Calcium: 9.4 mg/dL (ref 8.9–10.3)
Calcium: 9.2 mg/dL (ref 8.9–10.3)
Calcium: 8.6 mg/dL — ABNORMAL LOW (ref 8.9–10.3)
Chloride: 97 mmol/L — ABNORMAL LOW (ref 98–111)
Chloride: 102 mmol/L (ref 98–111)
Chloride: 103 mmol/L (ref 98–111)
Creatinine, Ser: 0.92 mg/dL (ref 0.61–1.24)
Creatinine, Ser: 0.95 mg/dL (ref 0.61–1.24)
Creatinine, Ser: 0.75 mg/dL (ref 0.61–1.24)
GFR, Estimated: >60 mL/min (ref >60)
GFR, Estimated: >60 mL/min (ref >60)
GFR, Estimated: >60 mL/min (ref >60)
Glucose, Bld: 377 mg/dL — ABNORMAL HIGH (ref 70–99)
Glucose, Bld: 335 mg/dL — ABNORMAL HIGH (ref 70–99)
Glucose, Bld: 310 mg/dL — ABNORMAL HIGH (ref 70–99)
Potassium: 3.7 mmol/L (ref 3.5–5.1)
Potassium: 3.9 mmol/L (ref 3.5–5.1)
Potassium: 3.5 mmol/L (ref 3.5–5.1)
Sodium: 135 mmol/L (ref 135–145)
Sodium: 138 mmol/L (ref 135–145)
Sodium: 136 mmol/L (ref 135–145)

## 2024-01-07 LAB — TROPONIN I (HIGH SENSITIVITY): Troponin I (High Sensitivity): 6 ng/L (ref <18)

## 2024-01-07 LAB — CBC
HCT: 48.6 % (ref 39.0–52.0)
Hemoglobin: 15.8 g/dL (ref 13.0–17.0)
MCH: 25.9 pg — ABNORMAL LOW (ref 26.0–34.0)
MCHC: 32.5 g/dL (ref 30.0–36.0)
MCV: 79.7 fL — ABNORMAL LOW (ref 80.0–100.0)
Platelets: 290 10*3/uL (ref 150–400)
RBC: 6.1 MIL/uL — ABNORMAL HIGH (ref 4.22–5.81)
RDW: 14.9 % (ref 11.5–15.5)
WBC: 20.7 10*3/uL — ABNORMAL HIGH (ref 4.0–10.5)
nRBC: 0 % (ref 0.0–0.2)

## 2024-01-07 LAB — URINALYSIS, ROUTINE W REFLEX MICROSCOPIC
Bacteria, UA: NONE SEEN
Bilirubin Urine: NEGATIVE
Glucose, UA: >=500 mg/dL — AB
Hgb urine dipstick: NEGATIVE
Ketones, ur: 80 mg/dL — AB
Leukocytes,Ua: NEGATIVE
Nitrite: NEGATIVE
Protein, ur: NEGATIVE mg/dL
Specific Gravity, Urine: >1.046 — ABNORMAL HIGH (ref 1.005–1.030)
pH: 5 (ref 5.0–8.0)

## 2024-01-07 LAB — CBG MONITORING, ED
Glucose-Capillary: 360 mg/dL — ABNORMAL HIGH (ref 70–99)
Glucose-Capillary: 351 mg/dL — ABNORMAL HIGH (ref 70–99)

## 2024-01-07 LAB — BLOOD GAS, VENOUS
Acid-base deficit: 2.1 mmol/L — ABNORMAL HIGH (ref 0.0–2.0)
Bicarbonate: 24.7 mmol/L (ref 20.0–28.0)
O2 Saturation: 57.4 %
Patient temperature: 37
pCO2, Ven: 49 mmHg (ref 44–60)
pH, Ven: 7.31 (ref 7.25–7.43)
pO2, Ven: 33 mmHg (ref 32–45)

## 2024-01-07 MED ORDER — SUCRALFATE 1 G PO TABS
1.0000 g | ORAL_TABLET | Freq: Once | ORAL | Status: AC
Start: 2024-01-07 — End: 2024-01-07
  Administered 2024-01-07: 1 g via ORAL
  Filled 2024-01-07: qty 1

## 2024-01-07 MED ORDER — IOHEXOL 300 MG/ML  SOLN
100.0000 mL | Freq: Once | INTRAMUSCULAR | Status: AC | PRN
  Administered 2024-01-07: 100 mL via INTRAVENOUS

## 2024-01-07 MED ORDER — INSULIN ASPART 100 UNIT/ML IJ SOLN
8.0000 [IU] | Freq: Once | INTRAMUSCULAR | Status: AC
Start: 2024-01-07 — End: 2024-01-07
  Administered 2024-01-07: 8 [IU] via SUBCUTANEOUS
  Filled 2024-01-07: qty 0.08

## 2024-01-07 MED ORDER — LACTATED RINGERS IV BOLUS
1000.0000 mL | Freq: Once | INTRAVENOUS | Status: AC
  Administered 2024-01-07: 1000 mL via INTRAVENOUS

## 2024-01-07 MED ORDER — SODIUM CHLORIDE 0.9 % IV SOLN
12.5000 mg | Freq: Once | INTRAVENOUS | Status: AC
  Administered 2024-01-08: 12.5 mg via INTRAVENOUS
  Filled 2024-01-07: qty 12.5

## 2024-01-07 MED ORDER — MORPHINE SULFATE (PF) 4 MG/ML IV SOLN
4.0000 mg | Freq: Once | INTRAVENOUS | Status: AC
  Administered 2024-01-07: 4 mg via INTRAVENOUS
  Filled 2024-01-07: qty 1

## 2024-01-07 MED ORDER — ONDANSETRON HCL 4 MG/2ML IJ SOLN
4.0000 mg | Freq: Once | INTRAMUSCULAR | Status: AC
  Administered 2024-01-07: 4 mg via INTRAVENOUS
  Filled 2024-01-07: qty 2

## 2024-01-07 MED ORDER — PANTOPRAZOLE SODIUM 40 MG IV SOLR
80.0000 mg | Freq: Once | INTRAVENOUS | Status: AC
  Administered 2024-01-07: 80 mg via INTRAVENOUS
  Filled 2024-01-07: qty 20

## 2024-01-07 NOTE — ED Triage Notes (Signed)
 Pt. From [facility] physicians for dehydration and chest pain. Pt. Non-compliant diabetic given LR by EMS. Ems cbg 377. 12 lead with ems unremarkable.

## 2024-01-07 NOTE — ED Provider Notes (Addendum)
 [place] EMERGENCY DEPARTMENT AT [facility] Provider Note   CSN: [id number] Arrival date & time: 01/07/24  1329     History  Chief Complaint  Patient presents with   Dehydration    Shawn Benson is a 43 y.o. male.  43 year old male with past medical history of diabetes and gastroparesis in the past presenting to the emergency department today with chest and abdominal discomfort.  The patient states that he has been having nausea and vomiting now for the past few days.  He states that he is having some upper abdominal pain as well as multiple episodes of nonbloody, nonbilious emesis.  He denies any associated diarrhea.  Denies any fevers.  He went to his primary care provider today and was sent in for further evaluation.  His blood sugars have been elevated at home as well.        Home Medications Prior to Admission medications   Medication Sig Start Date End Date Taking? Authorizing Provider  Blood Glucose Monitoring Suppl (ACCU-CHEK AVIVA PLUS) w/Device KIT Check blood sugar before meals 3 times daily 09/16/20   Julieanne Manson, MD  Continuous Glucose Sensor (DEXCOM G7 SENSOR) MISC Change sensors every 10 days 06/06/23   Shamleffer, Konrad Dolores, MD  gabapentin (NEURONTIN) 300 MG capsule  12/11/22   [provider]  insulin glargine, 2 Unit Dial, (TOUJEO MAX SOLOSTAR) 300 UNIT/ML Solostar Pen Inject 70 Units into the skin daily in the afternoon. 06/03/23   Shamleffer, Konrad Dolores, MD  insulin lispro (HUMALOG KWIKPEN) 100 UNIT/ML KwikPen Max daily 30 units 06/03/23   Shamleffer, Konrad Dolores, MD  Insulin Pen Needle (B-D ULTRAFINE III SHORT PEN) 31G X 8 MM MISC 1 Device by Does not apply route in the morning, at noon, in the evening, and at bedtime. 06/03/23   Shamleffer, Konrad Dolores, MD  metoCLOPramide (REGLAN) 10 MG tablet Take 1 tablet (10 mg total) by mouth 3 (three) times daily with meals. Patient taking differently: Take 10 mg by mouth 2  (two) times daily. 11/30/22 02/28/23  Noralee Stain, DO  ondansetron (ZOFRAN-ODT) 4 MG disintegrating tablet Take 1 tablet (4 mg total) by mouth every 8 (eight) hours as needed for up to 15 doses for nausea or vomiting. 01/05/24   Terald Sleeper, MD  pantoprazole (PROTONIX) 40 MG tablet Take 1 tablet (40 mg total) by mouth 2 (two) times daily before a meal. 11/30/22 05/09/23  Noralee Stain, DO  promethazine (PHENERGAN) 25 MG suppository Place 1 suppository (25 mg total) rectally every 6 (six) hours as needed for up to 6 doses for refractory nausea / vomiting. 01/05/24   Terald Sleeper, MD      Allergies    Metformin and related and Other    Review of Systems   Review of Systems  Gastrointestinal:  Positive for abdominal pain, nausea and vomiting.  All other systems reviewed and are negative.   Physical Exam Updated Vital Signs BP (!) 150/78   Pulse 71   Temp 98.6 F (37 C) (Oral)   Resp 19   SpO2 91%  Physical Exam Vitals and nursing note reviewed.   Gen: Appears uncomfortable, vomiting intermittently during interview Eyes: PERRL, EOMI HEENT: no oropharyngeal swelling Neck: trachea midline Resp: clear to auscultation bilaterally Card: RRR, no murmurs, rubs, or gallops Abd: Mild diffuse tenderness with maximal tenderness over the epigastrium and left upper quadrant without guarding or rebound Extremities: no calf tenderness, no edema Vascular: 2+ radial pulses bilaterally, 2+ DP pulses bilaterally  Skin: no rashes Psyc: acting appropriately   ED Results / Procedures / Treatments   Labs (all labs ordered are listed, but only abnormal results are displayed) Labs Reviewed  BASIC METABOLIC PANEL WITH GFR - Abnormal; Notable for the following components:      Result Value   Chloride 97 (*)    CO2 20 (*)    Glucose, Bld 377 (*)    BUN 25 (*)    Anion gap 18 (*)    All other components within normal limits  CBC - Abnormal; Notable for the following components:   WBC 20.7  (*)    RBC 6.10 (*)    MCV 79.7 (*)    MCH 25.9 (*)    All other components within normal limits  URINALYSIS, ROUTINE W REFLEX MICROSCOPIC - Abnormal; Notable for the following components:   Specific Gravity, Urine >1.046 (*)    Glucose, UA >=500 (*)    Ketones, ur 80 (*)    All other components within normal limits  BLOOD GAS, VENOUS - Abnormal; Notable for the following components:   Acid-base deficit 2.1 (*)    All other components within normal limits  BASIC METABOLIC PANEL WITH GFR - Abnormal; Notable for the following components:   CO2 20 (*)    Glucose, Bld 335 (*)    BUN 24 (*)    Anion gap 16 (*)    All other components within normal limits  BASIC METABOLIC PANEL WITH GFR - Abnormal; Notable for the following components:   CO2 21 (*)    Glucose, Bld 310 (*)    BUN 22 (*)    Calcium 8.6 (*)    All other components within normal limits  CBG MONITORING, ED - Abnormal; Notable for the following components:   Glucose-Capillary 360 (*)    All other components within normal limits  CBG MONITORING, ED - Abnormal; Notable for the following components:   Glucose-Capillary 351 (*)    All other components within normal limits  TROPONIN I (HIGH SENSITIVITY)    EKG EKG Interpretation Date/Time:  Tuesday January 07 2024 17:04:58 EDT Ventricular Rate:  68 PR Interval:  153 QRS Duration:  99 QT Interval:  427 QTC Calculation: 455 R Axis:   72  Text Interpretation: Sinus rhythm Confirmed by Beckey Downing [phone]) on 01/07/2024 7:59:28 PM  Radiology CT ABDOMEN PELVIS W CONTRAST Result Date: 01/07/2024 CLINICAL DATA:  Acute abdominal pain. EXAM: CT ABDOMEN AND PELVIS WITH CONTRAST TECHNIQUE: Multidetector CT imaging of the abdomen and pelvis was performed using the standard protocol following bolus administration of intravenous contrast. RADIATION DOSE REDUCTION: This exam was performed according to the departmental dose-optimization program which includes automated exposure control,  adjustment of the mA and/or kV according to patient size and/or use of iterative reconstruction technique. CONTRAST:  OMNIPAQUE IOHEXOL 300 MG/ML  SOLN COMPARISON:  CT abdomen and pelvis 11/22/2022. FINDINGS: Lower chest: No acute abnormality. Hepatobiliary: No focal liver abnormality is seen. No gallstones, gallbladder wall thickening, or biliary dilatation. Pancreas: Unremarkable. No pancreatic ductal dilatation or surrounding inflammatory changes. Spleen: Normal in size without focal abnormality. Adrenals/Urinary Tract: Adrenal glands are unremarkable. Kidneys are normal, without renal calculi, focal lesion, or hydronephrosis. Bladder is unremarkable. Stomach/Bowel: Stomach is within normal limits. Appendix appears normal. No evidence of bowel wall thickening, distention, or inflammatory changes. There is diffuse wall thickening and inflammation of the distal esophagus. Vascular/Lymphatic: No significant vascular findings are present. No enlarged abdominal or pelvic lymph nodes. Reproductive: Prostate is  unremarkable. Other: No abdominal wall hernia or abnormality. No abdominopelvic ascites. Musculoskeletal: No fracture is seen. IMPRESSION: Diffuse wall thickening and inflammation of the distal esophagus compatible with esophagitis. Consider endoscopy for further evaluation. Electronically Signed   By: Darliss Cheney M.D.   On: 01/07/2024 18:22   DG Chest 2 View Result Date: 01/07/2024 CLINICAL DATA:  Chest pain. EXAM: CHEST - 2 VIEW COMPARISON:  11/26/2022. FINDINGS: Bilateral lung fields are clear. Bilateral costophrenic angles are clear. Normal cardio-mediastinal silhouette. No acute osseous abnormalities. The soft tissues are within normal limits. IMPRESSION: No active cardiopulmonary disease. Electronically Signed   By: Jules Schick M.D.   On: 01/07/2024 17:10    Procedures Procedures    Medications Ordered in ED Medications  lactated ringers bolus 1,000 mL (0 mLs Intravenous Stopped 01/07/24  1900)  lactated ringers bolus 1,000 mL (0 mLs Intravenous Stopped 01/07/24 1900)  morphine (PF) 4 MG/ML injection 4 mg (4 mg Intravenous Given 01/07/24 1651)  ondansetron (ZOFRAN) injection 4 mg (4 mg Intravenous Given 01/07/24 1651)  iohexol (OMNIPAQUE) 300 MG/ML solution 100 mL (100 mLs Intravenous Contrast Given 01/07/24 1719)  insulin aspart (novoLOG) injection 8 Units (8 Units Subcutaneous Given 01/07/24 2118)  lactated ringers bolus 1,000 mL (0 mLs Intravenous Stopped 01/07/24 2311)  pantoprazole (PROTONIX) injection 80 mg (80 mg Intravenous Given 01/07/24 2118)  sucralfate (CARAFATE) tablet 1 g (1 g Oral Given 01/07/24 2119)    ED Course/ Medical Decision Making/ A&P                                 Medical Decision Making 43 year old male with past medical history of diabetes and gastroparesis presenting to the emergency department today with abdominal pain and vomiting.  I will further evaluate the patient here with basic labs including LFTs and lipase to evaluate for hepatobiliary pathology or pancreatitis.  Also obtain a urinalysis in addition to this to evaluate for DKA.  I will give patient IV fluids.  Will also give him antiemetics.  I will reevaluate for ultimate disposition.  The patient does have a significant leukocytosis with a white count of greater than 20,000.  I do not appreciate any concerning findings on his chest x-ray for Boerhaave's or pneumonia.  Will obtain a CT scan of his abdomen.  Patient is given pain medication here as well.  His initial anion gap is 18.  I will give the patient 2 L of IV fluids and reassess.  Still waiting on the patient's urine although this may represent mild DKA.  Will start with IV fluids and reassess after repeat BMP.  Vital signs are otherwise unremarkable based on symptoms this is more consistent with possible DKA or gastroparesis.  Suspicion for sepsis at this time is relatively low.  The initial labs are consistent with mild DKA.  The patient is given  additional IV fluids here as well as subcutaneous insulin and after 3 L and subcutaneous insulin his gap is closed.  The patient does have esophagitis on his CT scan.  On reassessment after multiple rounds of nausea medication he is vomiting again.  Calls placed to hospitalist service for admission.  CRITICAL CARE Performed by: Durwin Glaze   Total critical care time: 45 minutes  Critical care time was exclusive of separately billable procedures and treating other patients.  Critical care was necessary to treat or prevent imminent or life-threatening deterioration.  Critical care was time spent personally  by me on the following activities: development of treatment plan with patient and/or surrogate as well as nursing, discussions with consultants, evaluation of patient's response to treatment, examination of patient, obtaining history from patient or surrogate, ordering and performing treatments and interventions, ordering and review of laboratory studies, ordering and review of radiographic studies, pulse oximetry and re-evaluation of patient's condition.   Amount and/or Complexity of Data Reviewed Labs: ordered. Radiology: ordered.  Risk Prescription drug management. Decision regarding hospitalization.          Final Clinical Impression(s) / ED Diagnoses Final diagnoses:  Diabetic ketoacidosis without coma associated with other specified diabetes mellitus (HCC)  Esophagitis  Intractable vomiting    Rx / DC Orders ED Discharge Orders     None         Durwin Glaze, MD 01/07/24 2349    Durwin Glaze, MD 01/07/24 2356

## 2024-01-07 NOTE — ED Notes (Signed)
 Pt is actively throwing up wants to wait until he gets something for nausea before I do the EKG

## 2024-01-08 ENCOUNTER — Other Ambulatory Visit (HOSPITAL_COMMUNITY): Payer: Self-pay

## 2024-01-08 ENCOUNTER — Encounter (HOSPITAL_COMMUNITY): Payer: Self-pay | Admitting: Internal Medicine

## 2024-01-08 DIAGNOSIS — E119 Type 2 diabetes mellitus without complications: Secondary | ICD-10-CM | POA: Diagnosis not present

## 2024-01-08 DIAGNOSIS — Z794 Long term (current) use of insulin: Secondary | ICD-10-CM

## 2024-01-08 DIAGNOSIS — R030 Elevated blood-pressure reading, without diagnosis of hypertension: Secondary | ICD-10-CM | POA: Insufficient documentation

## 2024-01-08 DIAGNOSIS — R112 Nausea with vomiting, unspecified: Secondary | ICD-10-CM

## 2024-01-08 DIAGNOSIS — R1112 Projectile vomiting: Secondary | ICD-10-CM

## 2024-01-08 LAB — RAPID URINE DRUG SCREEN, HOSP PERFORMED
Amphetamines: NOT DETECTED
Barbiturates: NOT DETECTED
Benzodiazepines: NOT DETECTED
Cocaine: NOT DETECTED
Opiates: POSITIVE — AB
Tetrahydrocannabinol: POSITIVE — AB

## 2024-01-08 LAB — GLUCOSE, CAPILLARY
Glucose-Capillary: 191 mg/dL — ABNORMAL HIGH (ref 70–99)
Glucose-Capillary: 216 mg/dL — ABNORMAL HIGH (ref 70–99)
Glucose-Capillary: 230 mg/dL — ABNORMAL HIGH (ref 70–99)
Glucose-Capillary: 240 mg/dL — ABNORMAL HIGH (ref 70–99)
Glucose-Capillary: 279 mg/dL — ABNORMAL HIGH (ref 70–99)
Glucose-Capillary: 294 mg/dL — ABNORMAL HIGH (ref 70–99)

## 2024-01-08 LAB — CBC
HCT: 44.9 % (ref 39.0–52.0)
Hemoglobin: 14.6 g/dL (ref 13.0–17.0)
MCH: 25.7 pg — ABNORMAL LOW (ref 26.0–34.0)
MCHC: 32.5 g/dL (ref 30.0–36.0)
MCV: 78.9 fL — ABNORMAL LOW (ref 80.0–100.0)
Platelets: 256 10*3/uL (ref 150–400)
RBC: 5.69 MIL/uL (ref 4.22–5.81)
RDW: 14.9 % (ref 11.5–15.5)
WBC: 17.3 10*3/uL — ABNORMAL HIGH (ref 4.0–10.5)
nRBC: 0 % (ref 0.0–0.2)

## 2024-01-08 LAB — COMPREHENSIVE METABOLIC PANEL WITH GFR
ALT: 13 U/L (ref 0–44)
AST: 10 U/L — ABNORMAL LOW (ref 15–41)
Albumin: 3.4 g/dL — ABNORMAL LOW (ref 3.5–5.0)
Alkaline Phosphatase: 83 U/L (ref 38–126)
Anion gap: 10 (ref 5–15)
BUN: 21 mg/dL — ABNORMAL HIGH (ref 6–20)
CO2: 23 mmol/L (ref 22–32)
Calcium: 8.9 mg/dL (ref 8.9–10.3)
Chloride: 104 mmol/L (ref 98–111)
Creatinine, Ser: 0.7 mg/dL (ref 0.61–1.24)
GFR, Estimated: >60 mL/min (ref >60)
Glucose, Bld: 231 mg/dL — ABNORMAL HIGH (ref 70–99)
Potassium: 3.4 mmol/L — ABNORMAL LOW (ref 3.5–5.1)
Sodium: 137 mmol/L (ref 135–145)
Total Bilirubin: 1.2 mg/dL (ref 0.0–1.2)
Total Protein: 6.7 g/dL (ref 6.5–8.1)

## 2024-01-08 LAB — HIV ANTIBODY (ROUTINE TESTING W REFLEX): HIV Screen 4th Generation wRfx: NONREACTIVE

## 2024-01-08 LAB — HEMOGLOBIN A1C
Hgb A1c MFr Bld: 13.2 % — ABNORMAL HIGH (ref 4.8–5.6)
Mean Plasma Glucose: 332.14 mg/dL

## 2024-01-08 MED ORDER — METOCLOPRAMIDE HCL 5 MG/ML IJ SOLN
5.0000 mg | Freq: Four times a day (QID) | INTRAMUSCULAR | Status: DC | PRN
  Administered 2024-01-08: 5 mg via INTRAVENOUS
  Filled 2024-01-08: qty 2

## 2024-01-08 MED ORDER — GABAPENTIN 300 MG PO CAPS
300.0000 mg | ORAL_CAPSULE | Freq: Two times a day (BID) | ORAL | Status: DC
  Administered 2024-01-08 – 2024-01-14 (×13): 300 mg via ORAL
  Filled 2024-01-08 (×13): qty 1

## 2024-01-08 MED ORDER — INSULIN ASPART 100 UNIT/ML IJ SOLN
0.0000 [IU] | Freq: Three times a day (TID) | INTRAMUSCULAR | Status: DC
  Administered 2024-01-08: 5 [IU] via SUBCUTANEOUS
  Administered 2024-01-08 – 2024-01-09 (×4): 8 [IU] via SUBCUTANEOUS
  Administered 2024-01-09 – 2024-01-10 (×2): 11 [IU] via SUBCUTANEOUS
  Administered 2024-01-10: 5 [IU] via SUBCUTANEOUS
  Administered 2024-01-10: 3 [IU] via SUBCUTANEOUS
  Administered 2024-01-10 – 2024-01-11 (×2): 8 [IU] via SUBCUTANEOUS
  Administered 2024-01-11 (×3): 5 [IU] via SUBCUTANEOUS

## 2024-01-08 MED ORDER — CHLORPROMAZINE HCL 25 MG PO TABS
25.0000 mg | ORAL_TABLET | Freq: Three times a day (TID) | ORAL | Status: DC | PRN
  Administered 2024-01-08: 25 mg via ORAL
  Filled 2024-01-08 (×2): qty 1

## 2024-01-08 MED ORDER — ROSUVASTATIN CALCIUM 10 MG PO TABS
10.0000 mg | ORAL_TABLET | Freq: Every day | ORAL | Status: DC
  Administered 2024-01-08 – 2024-01-14 (×7): 10 mg via ORAL
  Filled 2024-01-08 (×7): qty 1

## 2024-01-08 MED ORDER — PROCHLORPERAZINE EDISYLATE 10 MG/2ML IJ SOLN: 5.0000 mg | Freq: Four times a day (QID) | INTRAMUSCULAR | Status: DC | PRN

## 2024-01-08 MED ORDER — GUAIFENESIN 100 MG/5ML PO LIQD: 5.0000 mL | ORAL | Status: DC | PRN

## 2024-01-08 MED ORDER — HYDRALAZINE HCL 20 MG/ML IJ SOLN: 10.0000 mg | INTRAMUSCULAR | Status: DC | PRN

## 2024-01-08 MED ORDER — TRAZODONE HCL 50 MG PO TABS
50.0000 mg | ORAL_TABLET | Freq: Every evening | ORAL | Status: DC | PRN
  Administered 2024-01-09 – 2024-01-12 (×3): 50 mg via ORAL
  Filled 2024-01-08 (×3): qty 1

## 2024-01-08 MED ORDER — INSULIN ASPART 100 UNIT/ML IJ SOLN
0.0000 [IU] | INTRAMUSCULAR | Status: DC
  Administered 2024-01-08: 3 [IU] via SUBCUTANEOUS
  Administered 2024-01-08 (×2): 5 [IU] via SUBCUTANEOUS
  Administered 2024-01-08: 8 [IU] via SUBCUTANEOUS
  Filled 2024-01-08: qty 0.15

## 2024-01-08 MED ORDER — AMLODIPINE BESYLATE 10 MG PO TABS
5.0000 mg | ORAL_TABLET | Freq: Every day | ORAL | Status: DC
  Administered 2024-01-08 – 2024-01-13 (×6): 5 mg via ORAL
  Filled 2024-01-08 (×6): qty 1

## 2024-01-08 MED ORDER — LACTATED RINGERS IV SOLN: INTRAVENOUS | Status: DC

## 2024-01-08 MED ORDER — IPRATROPIUM-ALBUTEROL 0.5-2.5 (3) MG/3ML IN SOLN: 3.0000 mL | RESPIRATORY_TRACT | Status: DC | PRN

## 2024-01-08 MED ORDER — ACETAMINOPHEN 325 MG PO TABS
650.0000 mg | ORAL_TABLET | Freq: Four times a day (QID) | ORAL | Status: DC | PRN
  Administered 2024-01-12 – 2024-01-13 (×2): 650 mg via ORAL
  Filled 2024-01-08 (×2): qty 2

## 2024-01-08 MED ORDER — METOPROLOL TARTRATE 5 MG/5ML IV SOLN: 5.0000 mg | INTRAVENOUS | Status: DC | PRN

## 2024-01-08 MED ORDER — PANTOPRAZOLE SODIUM 40 MG IV SOLR
40.0000 mg | INTRAVENOUS | Status: DC
  Administered 2024-01-08: 40 mg via INTRAVENOUS
  Filled 2024-01-08: qty 10

## 2024-01-08 MED ORDER — SENNOSIDES-DOCUSATE SODIUM 8.6-50 MG PO TABS
1.0000 | ORAL_TABLET | Freq: Every evening | ORAL | Status: DC | PRN
  Administered 2024-01-13: 1 via ORAL
  Filled 2024-01-08: qty 1

## 2024-01-08 MED ORDER — POTASSIUM CHLORIDE CRYS ER 20 MEQ PO TBCR
40.0000 meq | EXTENDED_RELEASE_TABLET | Freq: Once | ORAL | Status: AC
Start: 2024-01-08 — End: 2024-01-08
  Administered 2024-01-08: 40 meq via ORAL
  Filled 2024-01-08: qty 2

## 2024-01-08 MED ORDER — PANTOPRAZOLE SODIUM 40 MG PO TBEC
40.0000 mg | DELAYED_RELEASE_TABLET | Freq: Two times a day (BID) | ORAL | Status: DC
  Administered 2024-01-08 – 2024-01-14 (×12): 40 mg via ORAL
  Filled 2024-01-08 (×12): qty 1

## 2024-01-08 MED ORDER — ENOXAPARIN SODIUM 80 MG/0.8ML IJ SOSY
75.0000 mg | PREFILLED_SYRINGE | INTRAMUSCULAR | Status: DC
  Administered 2024-01-08 – 2024-01-09 (×2): 75 mg via SUBCUTANEOUS
  Filled 2024-01-08 (×2): qty 0.8

## 2024-01-08 NOTE — H&P (Signed)
 History and Physical    Shawn P Treece ZOX:[id number] DOB: 19-Feb-1981 DOA: 01/07/2024  Patient coming from: Home.  Chief Complaint: Nausea vomiting.  HPI: Shawn Benson is a 43 y.o. male with history of diabetes mellitus type 2 presents to the ER with complaints of persistent nausea vomiting ongoing for last 48 hours.  Patient has been having intermittent nausea vomiting last 2 weeks which has worsened acutely last 48 hours.  Has not been able to eat much has lost weight.  Also complains of abdominal discomfort.  Had initially some diarrhea which has resolved.  Denies taking any antibiotics recently.  Patient states he has not taken his Toujeo over the last 2 weeks because he ran out of medications.  In February 2024 patient was admitted for nausea vomiting at that time patient's gastric emptying study showed features concerning for gastroparesis.  EGD done on 4/24 showed normal esophagus and stomach.  Patient admits to taking marijuana last almost 2 days ago.    ED Course: CT of the abdomen pelvis done in the ER shows features concerning for a esophagitis.  Patient's initial lab was concerning for early DKA which improved with fluid boluses and subcu insulin.  Since patient is in persistent nausea vomiting admitted for further observation.  Review of Systems: As per HPI, rest all negative.   Past Medical History:  Diagnosis Date   Allergy    Seasonal--spring through fall.   Asthma childhood and adult   triggers are allergies and URI.  Spring, summer and fall.   Diabetes mellitus without complication (HCC)    Morbid obesity (HCC)    Peripheral edema 04/09/2017    Past Surgical History:  Procedure Laterality Date   INCISION AND DRAINAGE PERIRECTAL ABSCESS N/A 10/06/2015   Procedure: IRRIGATION AND DEBRIDEMENT PERIANAL  ABSCESS;  Surgeon: Ovidio Kin, MD;  Location: [facility];  Service: General;  Laterality: N/A;   KNEE SURGERY     Right and Left Knee   UPPER GASTROINTESTINAL  ENDOSCOPY  01/08/2023     reports that he has been smoking cigarettes. He has never used smokeless tobacco. He reports current alcohol use. He reports current drug use. Drug: Marijuana.  Allergies  Allergen Reactions   Metformin And Related Swelling   Other Swelling    Rybelsus (semaglutide)    Family History  Problem Relation Age of Onset   Diabetes Mother    Diabetes Father    Diabetes Brother    Diabetes Brother    Colon cancer Neg Hx    Stomach cancer Neg Hx    Esophageal cancer Neg Hx     Prior to Admission medications   Medication Sig Start Date End Date Taking? Authorizing Provider  Blood Glucose Monitoring Suppl (ACCU-CHEK AVIVA PLUS) w/Device KIT Check blood sugar before meals 3 times daily 09/16/20   Julieanne Manson, MD  Continuous Glucose Sensor (DEXCOM G7 SENSOR) MISC Change sensors every 10 days 06/06/23   Shamleffer, Konrad Dolores, MD  gabapentin (NEURONTIN) 300 MG capsule  12/11/22   [provider]  insulin glargine, 2 Unit Dial, (TOUJEO MAX SOLOSTAR) 300 UNIT/ML Solostar Pen Inject 70 Units into the skin daily in the afternoon. 06/03/23   Shamleffer, Konrad Dolores, MD  insulin lispro (HUMALOG KWIKPEN) 100 UNIT/ML KwikPen Max daily 30 units 06/03/23   Shamleffer, Konrad Dolores, MD  Insulin Pen Needle (B-D ULTRAFINE III SHORT PEN) 31G X 8 MM MISC 1 Device by Does not apply route in the morning, at noon, in the evening, and at  bedtime. 06/03/23   Shamleffer, Konrad Dolores, MD  metoCLOPramide (REGLAN) 10 MG tablet Take 1 tablet (10 mg total) by mouth 3 (three) times daily with meals. Patient taking differently: Take 10 mg by mouth 2 (two) times daily. 11/30/22 02/28/23  Noralee Stain, DO  ondansetron (ZOFRAN-ODT) 4 MG disintegrating tablet Take 1 tablet (4 mg total) by mouth every 8 (eight) hours as needed for up to 15 doses for nausea or vomiting. 01/05/24   Terald Sleeper, MD  pantoprazole (PROTONIX) 40 MG tablet Take 1 tablet (40 mg total) by mouth 2  (two) times daily before a meal. 11/30/22 05/09/23  Noralee Stain, DO  promethazine (PHENERGAN) 25 MG suppository Place 1 suppository (25 mg total) rectally every 6 (six) hours as needed for up to 6 doses for refractory nausea / vomiting. 01/05/24   Terald Sleeper, MD    Physical Exam: Constitutional: Moderately built and nourished. Vitals:   01/07/24 2215 01/07/24 2230 01/07/24 2245 01/07/24 2300  BP: (!) 140/49 139/79 (!) 155/95 (!) 150/78  Pulse: 65 75 (!) 102 71  Resp:      Temp:      TempSrc:      SpO2: 93% 97% 97% 91%   Eyes: Anicteric no pallor. ENMT: No discharge from the ears eyes nose or mouth. Neck: No mass felt.  No neck rigidity. Respiratory: No rhonchi or crepitations. Cardiovascular: S1-S2 heard. Abdomen: Soft nontender bowel sound present. Musculoskeletal: No edema. Skin: No rash. Neurologic: Alert awake oriented to time place and person.  Moves all extremities. Psychiatric: Appears normal.  Normal affect.   Labs on Admission: I have personally reviewed following labs and imaging studies  CBC: Recent Labs  Lab 01/05/24 1815 01/05/24 1825 01/07/24 1345  WBC 13.8*  --  20.7*  HGB 14.6 15.0 15.8  HCT 44.9 44.0 48.6  MCV 79.2*  --  79.7*  PLT 262  --  290   Basic Metabolic Panel: Recent Labs  Lab 01/05/24 1815 01/05/24 1825 01/07/24 1345 01/07/24 2014 01/07/24 2313  NA 139 141 135 138 136  K 3.8 3.8 3.7 3.9 3.5  CL 104 105 97* 102 103  CO2 22  --  20* 20* 21*  GLUCOSE 449* 423* 377* 335* 310*  BUN 13 12 25* 24* 22*  CREATININE 0.76 0.70 0.92 0.95 0.75  CALCIUM 9.1  --  9.4 9.2 8.6*   GFR: Estimated Creatinine Clearance: 196.5 mL/min (by C-G formula based on SCr of 0.75 mg/dL). Liver Function Tests: Recent Labs  Lab 01/05/24 1815  AST 10*  ALT 12  ALKPHOS 100  BILITOT 0.8  PROT 7.5  ALBUMIN 3.9   Recent Labs  Lab 01/05/24 1815  LIPASE 23   No results for input(s): "AMMONIA" in the last 168 hours. Coagulation Profile: No results  for input(s): "INR", "PROTIME" in the last 168 hours. Cardiac Enzymes: No results for input(s): "CKTOTAL", "CKMB", "CKMBINDEX", "TROPONINI" in the last 168 hours. BNP (last 3 results) No results for input(s): "PROBNP" in the last 8760 hours. HbA1C: No results for input(s): "HGBA1C" in the last 72 hours. CBG: Recent Labs  Lab 01/05/24 1743 01/05/24 1933 01/05/24 2153 01/07/24 1339 01/07/24 1939  GLUCAP 374* 404* 385* 360* 351*   Lipid Profile: No results for input(s): "CHOL", "HDL", "LDLCALC", "TRIG", "CHOLHDL", "LDLDIRECT" in the last 72 hours. Thyroid Function Tests: No results for input(s): "TSH", "T4TOTAL", "FREET4", "T3FREE", "THYROIDAB" in the last 72 hours. Anemia Panel: No results for input(s): "VITAMINB12", "FOLATE", "FERRITIN", "TIBC", "IRON", "RETICCTPCT" in the  last 72 hours. Urine analysis:    Component Value Date/Time   COLORURINE YELLOW 01/07/2024 2021   APPEARANCEUR CLEAR 01/07/2024 2021   LABSPEC >1.046 (H) 01/07/2024 2021   PHURINE 5.0 01/07/2024 2021   GLUCOSEU >=500 (A) 01/07/2024 2021   HGBUR NEGATIVE 01/07/2024 2021   BILIRUBINUR NEGATIVE 01/07/2024 2021   BILIRUBINUR neg 07/22/2017 1114   KETONESUR 80 (A) 01/07/2024 2021   PROTEINUR NEGATIVE 01/07/2024 2021   UROBILINOGEN 0.2 07/22/2017 1114   NITRITE NEGATIVE 01/07/2024 2021   LEUKOCYTESUR NEGATIVE 01/07/2024 2021   Sepsis Labs: @LABRCNTIP (procalcitonin:4,lacticidven:4) )No results found for this or any previous visit (from the past 240 hours).   Radiological Exams on Admission: CT ABDOMEN PELVIS W CONTRAST Result Date: 01/07/2024 CLINICAL DATA:  Acute abdominal pain. EXAM: CT ABDOMEN AND PELVIS WITH CONTRAST TECHNIQUE: Multidetector CT imaging of the abdomen and pelvis was performed using the standard protocol following bolus administration of intravenous contrast. RADIATION DOSE REDUCTION: This exam was performed according to the departmental dose-optimization program which includes automated  exposure control, adjustment of the mA and/or kV according to patient size and/or use of iterative reconstruction technique. CONTRAST:  OMNIPAQUE IOHEXOL 300 MG/ML  SOLN COMPARISON:  CT abdomen and pelvis 11/22/2022. FINDINGS: Lower chest: No acute abnormality. Hepatobiliary: No focal liver abnormality is seen. No gallstones, gallbladder wall thickening, or biliary dilatation. Pancreas: Unremarkable. No pancreatic ductal dilatation or surrounding inflammatory changes. Spleen: Normal in size without focal abnormality. Adrenals/Urinary Tract: Adrenal glands are unremarkable. Kidneys are normal, without renal calculi, focal lesion, or hydronephrosis. Bladder is unremarkable. Stomach/Bowel: Stomach is within normal limits. Appendix appears normal. No evidence of bowel wall thickening, distention, or inflammatory changes. There is diffuse wall thickening and inflammation of the distal esophagus. Vascular/Lymphatic: No significant vascular findings are present. No enlarged abdominal or pelvic lymph nodes. Reproductive: Prostate is unremarkable. Other: No abdominal wall hernia or abnormality. No abdominopelvic ascites. Musculoskeletal: No fracture is seen. IMPRESSION: Diffuse wall thickening and inflammation of the distal esophagus compatible with esophagitis. Consider endoscopy for further evaluation. Electronically Signed   By: Darliss Cheney M.D.   On: 01/07/2024 18:22   DG Chest 2 View Result Date: 01/07/2024 CLINICAL DATA:  Chest pain. EXAM: CHEST - 2 VIEW COMPARISON:  11/26/2022. FINDINGS: Bilateral lung fields are clear. Bilateral costophrenic angles are clear. Normal cardio-mediastinal silhouette. No acute osseous abnormalities. The soft tissues are within normal limits. IMPRESSION: No active cardiopulmonary disease. Electronically Signed   By: Jules Schick M.D.   On: 01/07/2024 17:10    EKG: Independently reviewed.  Normal sinus rhythm.  Assessment/Plan Principal Problem:   Nausea &  vomiting Active Problems:   Type 2 diabetes mellitus without complication, with long-term current use of insulin (HCC)   Asthma   Morbid obesity due to excess calories (HCC)   Diabetic gastroparesis (HCC)    Intractable nausea vomiting likely from diabetic gastroparesis and could also be contributed by cannabis hyperemesis syndrome.  Patient did have a gastric emptying study done on February 2024 which did show features concerning for gastroparesis.  EGD done on April 2024 was normal.  Today's CT scan does show some esophagitis.  Will keep patient on Protonix and also as needed Reglan for now.  Advance diet as tolerated. Uncontrolled diabetes mellitus type 2 has not taken his Toujeo for almost 2 weeks now as per the patient.  Patient initially had early DKA which has improved with fluids.  Last hemoglobin A1c was 11.2 on August 2024.  For now I have kept patient  on every 4 hourly moderate dose sliding scale coverage.  Restart long-acting insulin which patient takes 70 units at home and patient can eat reliably. Elevated blood pressure reading closely follow blood pressure trends. Marijuana abuse advised about quitting.  Since patient has intractable nausea vomiting with possible gastric paresis will need further management and more than 2 midnight stay.   DVT prophylaxis: Lovenox. Code Status: Full code. Family Communication: Discussed with patient. Disposition Plan: Medical floor. Consults called: None. Admission status: Observation.

## 2024-01-08 NOTE — Hospital Course (Addendum)
 Brief Narrative:   43 year old with history of DM2 with history of gastroparesis comes to the hospital with complaints of ongoing persistent nausea, vomiting for about 48 hours.  CT abdomen pelvis shows concerns of esophagitis and possible early DKA.  Had endoscopy in April 2024 showing normal esophagus and stomach.  Patient admitted to the hospital with concerns of gastroparesis  Assessment & Plan:  Principal Problem:   Nausea & vomiting Active Problems:   Type 2 diabetes mellitus without complication, with long-term current use of insulin (HCC)   Asthma   Morbid obesity due to excess calories (HCC)   Diabetic gastroparesis (HCC)   Elevated blood pressure reading   Intractable nausea vomiting - Suspect secondary to diabetic gastroparesis and cannabis hyperemesis syndrome.  Patient does have prior history of positive gastric emptying study in February 2024.  CT scan today shows esophagitis otherwise unremarkable.  Will continue patient on antiemetics/Reglan and Protonix.  Will slowly advance diet as tolerated. - Continue IV fluids  Elevated anion gap - Initially thought to be possible early DKA versus dehydration.  With IV fluids this has resolved.  Hypokalemia - As needed repletion  Leukocytosis - I suspect this is reactive in nature.  Will continue to trend.  No obvious signs of infection at this time  Elevated blood pressure - Start Norvasc.  IV as needed. May swtich to ACE I or ARB upon dc or in the near future.   Uncontrolled diabetes mellitus type 2, insulin-dependent - Previous A1c 11.2.  Will recheck this.  Currently on sliding scale and Accu-Cheks.  Will consult diabetic coordinator  Marijuana use -Counseled to quit using this   DVT prophylaxis: Lovenox    Code Status: Full Code Family Communication:  Mom updated.  Ongoing treatment for intractable nausea vomiting    Subjective: Seen at bedside.  Still having some nausea but improved compared to  yesterday  Examination:  General exam: Appears calm and comfortable  Respiratory system: Clear to auscultation. Respiratory effort normal. Cardiovascular system: S1 & S2 heard, RRR. No JVD, murmurs, rubs, gallops or clicks. No pedal edema. Gastrointestinal system: Abdomen is nondistended, soft and nontender. No organomegaly or masses felt. Normal bowel sounds heard. Central nervous system: Alert and oriented. No focal neurological deficits. Extremities: Symmetric 5 x 5 power. Skin: No rashes, lesions or ulcers Psychiatry: Judgement and insight appear normal. Mood & affect appropriate.

## 2024-01-08 NOTE — Inpatient Diabetes Management (Signed)
 Inpatient Diabetes Program Recommendations  AACE/ADA: New Consensus Statement on Inpatient Glycemic Control (2015)  Target Ranges:  Prepandial:   less than 140 mg/dL      Peak postprandial:   less than 180 mg/dL (1-2 hours)      Critically ill patients:  140 - 180 mg/dL   Lab Results  Component Value Date   GLUCAP 216 (H) 01/08/2024   HGBA1C 13.2 (H) 01/08/2024    Review of Glycemic Control  Latest Reference Range & Units 01/07/24 13:39 01/07/24 19:39 01/08/24 00:51 01/08/24 03:57 01/08/24 07:28 01/08/24 12:06  Glucose-Capillary 70 - 99 mg/dL 161 (H) 096 (H) 045 (H) 230 (H) 191 (H) 216 (H)  (H): Data is abnormally high  Diabetes history: DM2 Outpatient Diabetes medications: Toujeo 70 units every day, Humalog 6-20 units TID Current orders for Inpatient glycemic control: Novolog 0-15 units TID  Inpatient Diabetes Program Recommendations:    Please consider:  Semglee 30 units every day  Met with patient at bedside.  Reviewed patient's current A1c of 13.2% (average BG of 332 mg/dL). Explained what a A1c is and what it measures. Also reviewed goal A1c with patient, importance of good glucose control @ home, and blood sugar goals.   He has been out of insulin for 2 weeks; he states he is the only one working in the home and he could not afford it.  He states he is current with his PCP and he can now obtain his insulins at [facility] for $10.  He states he can afford this price.    Difficult to have a meaningful conversation with him as he is completely under the covers and when he does uncover his head he does not open his eyes.  Answers questions with very short 1-2 word answers.  Will ask our [facility] pharmacy for a benefit check on insulins.    Will attempt to talk to him later.  Will continue to follow while inpatient.  Thank you, Dulce Sellar, MSN, CDCES Diabetes Coordinator Inpatient Diabetes Program [phone] (team pager from 8a-5p)

## 2024-01-08 NOTE — Plan of Care (Signed)

## 2024-01-08 NOTE — Progress Notes (Addendum)
 PROGRESS NOTE    Shawn Benson  ZOX:[id number] DOB: [birth year] DOA: 01/07/2024 PCP: Carin Hock, PA    Brief Narrative:   43 year old with history of DM2 with history of gastroparesis comes to the hospital with complaints of ongoing persistent nausea, vomiting for about 48 hours.  CT abdomen pelvis shows concerns of esophagitis and possible early DKA.  Had endoscopy in April 2024 showing normal esophagus and stomach.  Patient admitted to the hospital with concerns of gastroparesis  Assessment & Plan:  Principal Problem:   Nausea & vomiting Active Problems:   Type 2 diabetes mellitus without complication, with long-term current use of insulin (HCC)   Asthma   Morbid obesity due to excess calories (HCC)   Diabetic gastroparesis (HCC)   Elevated blood pressure reading   Intractable nausea vomiting - Suspect secondary to diabetic gastroparesis and cannabis hyperemesis syndrome.  Patient does have prior history of positive gastric emptying study in February 2024.  CT scan today shows esophagitis otherwise unremarkable.  Will continue patient on antiemetics/Reglan and Protonix.  Will slowly advance diet as tolerated. - Continue IV fluids  Elevated anion gap - Initially thought to be possible early DKA versus dehydration.  With IV fluids this has resolved.  Hypokalemia - As needed repletion  Leukocytosis - I suspect this is reactive in nature.  Will continue to trend.  No obvious signs of infection at this time  Elevated blood pressure - Start Norvasc.  IV as needed. May swtich to ACE I or ARB upon dc or in the near future.   Uncontrolled diabetes mellitus type 2, insulin-dependent - Previous A1c 11.2.  Will recheck this.  Currently on sliding scale and Accu-Cheks.  Will consult diabetic coordinator  Marijuana use -Counseled to quit using this   DVT prophylaxis: Lovenox    Code Status: Full Code Family Communication:  Mom updated.  Ongoing treatment for intractable  nausea vomiting    Subjective: Seen at bedside.  Still having some nausea but improved compared to yesterday  Examination:  General exam: Appears calm and comfortable  Respiratory system: Clear to auscultation. Respiratory effort normal. Cardiovascular system: S1 & S2 heard, RRR. No JVD, murmurs, rubs, gallops or clicks. No pedal edema. Gastrointestinal system: Abdomen is nondistended, soft and nontender. No organomegaly or masses felt. Normal bowel sounds heard. Central nervous system: Alert and oriented. No focal neurological deficits. Extremities: Symmetric 5 x 5 power. Skin: No rashes, lesions or ulcers Psychiatry: Judgement and insight appear normal. Mood & affect appropriate.                Diet Orders (From admission, onward)     Start     Ordered   01/08/24 0927  Diet Carb Modified Fluid consistency: Thin  Diet effective now       Question Answer Comment  Diet-HS Snack? Snack-yes   Calorie Level Medium 1600-2000   Fluid consistency: Thin      01/08/24 0926            Objective: Vitals:   01/08/24 0050 01/08/24 0057 01/08/24 0519 01/08/24 0830  BP: (!) 154/63  136/78 (!) 172/95  Pulse: 68  67 74  Resp: 19  17 16   Temp: 98.8 F (37.1 C)  98.9 F (37.2 C) 98.8 F (37.1 C)  TempSrc:   Oral Oral  SpO2: 100%  100% 97%  Weight:  (!) 143 kg    Height:  6\' 7"  (2.007 m)      Intake/Output Summary (Last 24 hours) at  01/08/2024 1157 Last data filed at 01/07/2024 2311 Gross per 24 hour  Intake 1000 ml  Output --  Net 1000 ml   Filed Weights   01/08/24 0057  Weight: (!) 143 kg    Scheduled Meds:  amLODipine  5 mg Oral Daily   enoxaparin (LOVENOX) injection  75 mg Subcutaneous Q24H   gabapentin  300 mg Oral BID   insulin aspart  0-15 Units Subcutaneous Q4H   pantoprazole  40 mg Oral BID   rosuvastatin  10 mg Oral Daily   Continuous Infusions:  lactated ringers 100 mL/hr at 01/08/24 1026    Nutritional status     Body mass index is 35.52  kg/m.  Data Reviewed:   CBC: Recent Labs  Lab 01/05/24 1815 01/05/24 1825 01/07/24 1345 01/08/24 0419  WBC 13.8*  --  20.7* 17.3*  HGB 14.6 15.0 15.8 14.6  HCT 44.9 44.0 48.6 44.9  MCV 79.2*  --  79.7* 78.9*  PLT 262  --  290 256   Basic Metabolic Panel: Recent Labs  Lab 01/05/24 1815 01/05/24 1825 01/07/24 1345 01/07/24 2014 01/07/24 2313 01/08/24 0419  NA 139 141 135 138 136 137  K 3.8 3.8 3.7 3.9 3.5 3.4*  CL 104 105 97* 102 103 104  CO2 22  --  20* 20* 21* 23  GLUCOSE 449* 423* 377* 335* 310* 231*  BUN 13 12 25* 24* 22* 21*  CREATININE 0.76 0.70 0.92 0.95 0.75 0.70  CALCIUM 9.1  --  9.4 9.2 8.6* 8.9   GFR: Estimated Creatinine Clearance: 189 mL/min (by C-G formula based on SCr of 0.7 mg/dL). Liver Function Tests: Recent Labs  Lab 01/05/24 1815 01/08/24 0419  AST 10* 10*  ALT 12 13  ALKPHOS 100 83  BILITOT 0.8 1.2  PROT 7.5 6.7  ALBUMIN 3.9 3.4*   Recent Labs  Lab 01/05/24 1815  LIPASE 23   No results for input(s): "AMMONIA" in the last 168 hours. Coagulation Profile: No results for input(s): "INR", "PROTIME" in the last 168 hours. Cardiac Enzymes: No results for input(s): "CKTOTAL", "CKMB", "CKMBINDEX", "TROPONINI" in the last 168 hours. BNP (last 3 results) No results for input(s): "PROBNP" in the last 8760 hours. HbA1C: Recent Labs    01/08/24 0419  HGBA1C 13.2*   CBG: Recent Labs  Lab 01/07/24 1339 01/07/24 1939 01/08/24 0051 01/08/24 0357 01/08/24 0728  GLUCAP 360* 351* 294* 230* 191*   Lipid Profile: No results for input(s): "CHOL", "HDL", "LDLCALC", "TRIG", "CHOLHDL", "LDLDIRECT" in the last 72 hours. Thyroid Function Tests: No results for input(s): "TSH", "T4TOTAL", "FREET4", "T3FREE", "THYROIDAB" in the last 72 hours. Anemia Panel: No results for input(s): "VITAMINB12", "FOLATE", "FERRITIN", "TIBC", "IRON", "RETICCTPCT" in the last 72 hours. Sepsis Labs: No results for input(s): "PROCALCITON", "LATICACIDVEN" in the last  168 hours.  No results found for this or any previous visit (from the past 240 hours).       Radiology Studies: CT ABDOMEN PELVIS W CONTRAST Result Date: 01/07/2024 CLINICAL DATA:  Acute abdominal pain. EXAM: CT ABDOMEN AND PELVIS WITH CONTRAST TECHNIQUE: Multidetector CT imaging of the abdomen and pelvis was performed using the standard protocol following bolus administration of intravenous contrast. RADIATION DOSE REDUCTION: This exam was performed according to the departmental dose-optimization program which includes automated exposure control, adjustment of the mA and/or kV according to patient size and/or use of iterative reconstruction technique. CONTRAST:  OMNIPAQUE IOHEXOL 300 MG/ML  SOLN COMPARISON:  CT abdomen and pelvis 11/22/2022. FINDINGS: Lower chest: No  acute abnormality. Hepatobiliary: No focal liver abnormality is seen. No gallstones, gallbladder wall thickening, or biliary dilatation. Pancreas: Unremarkable. No pancreatic ductal dilatation or surrounding inflammatory changes. Spleen: Normal in size without focal abnormality. Adrenals/Urinary Tract: Adrenal glands are unremarkable. Kidneys are normal, without renal calculi, focal lesion, or hydronephrosis. Bladder is unremarkable. Stomach/Bowel: Stomach is within normal limits. Appendix appears normal. No evidence of bowel wall thickening, distention, or inflammatory changes. There is diffuse wall thickening and inflammation of the distal esophagus. Vascular/Lymphatic: No significant vascular findings are present. No enlarged abdominal or pelvic lymph nodes. Reproductive: Prostate is unremarkable. Other: No abdominal wall hernia or abnormality. No abdominopelvic ascites. Musculoskeletal: No fracture is seen. IMPRESSION: Diffuse wall thickening and inflammation of the distal esophagus compatible with esophagitis. Consider endoscopy for further evaluation. Electronically Signed   By: Darliss Cheney M.D.   On: 01/07/2024 18:22   DG  Chest 2 View Result Date: 01/07/2024 CLINICAL DATA:  Chest pain. EXAM: CHEST - 2 VIEW COMPARISON:  11/26/2022. FINDINGS: Bilateral lung fields are clear. Bilateral costophrenic angles are clear. Normal cardio-mediastinal silhouette. No acute osseous abnormalities. The soft tissues are within normal limits. IMPRESSION: No active cardiopulmonary disease. Electronically Signed   By: Jules Schick M.D.   On: 01/07/2024 17:10           LOS: 0 days   Time spent= 35 mins    Miguel Rota, MD [facility]  If 7PM-7AM, please contact night-coverage  01/08/2024, 11:57 AM

## 2024-01-09 DIAGNOSIS — E1143 Type 2 diabetes mellitus with diabetic autonomic (poly)neuropathy: Secondary | ICD-10-CM | POA: Diagnosis present

## 2024-01-09 DIAGNOSIS — R1310 Dysphagia, unspecified: Secondary | ICD-10-CM | POA: Diagnosis present

## 2024-01-09 DIAGNOSIS — E860 Dehydration: Secondary | ICD-10-CM | POA: Diagnosis present

## 2024-01-09 DIAGNOSIS — Z888 Allergy status to other drugs, medicaments and biological substances status: Secondary | ICD-10-CM | POA: Diagnosis not present

## 2024-01-09 DIAGNOSIS — R1110 Vomiting, unspecified: Secondary | ICD-10-CM

## 2024-01-09 DIAGNOSIS — E1310 Other specified diabetes mellitus with ketoacidosis without coma: Secondary | ICD-10-CM | POA: Diagnosis not present

## 2024-01-09 DIAGNOSIS — E2740 Unspecified adrenocortical insufficiency: Secondary | ICD-10-CM | POA: Diagnosis present

## 2024-01-09 DIAGNOSIS — K209 Esophagitis, unspecified without bleeding: Secondary | ICD-10-CM

## 2024-01-09 DIAGNOSIS — F1210 Cannabis abuse, uncomplicated: Secondary | ICD-10-CM | POA: Diagnosis present

## 2024-01-09 DIAGNOSIS — E1110 Type 2 diabetes mellitus with ketoacidosis without coma: Secondary | ICD-10-CM | POA: Diagnosis present

## 2024-01-09 DIAGNOSIS — K5900 Constipation, unspecified: Secondary | ICD-10-CM | POA: Diagnosis present

## 2024-01-09 DIAGNOSIS — R109 Unspecified abdominal pain: Secondary | ICD-10-CM | POA: Diagnosis not present

## 2024-01-09 DIAGNOSIS — K3184 Gastroparesis: Secondary | ICD-10-CM | POA: Diagnosis present

## 2024-01-09 DIAGNOSIS — Z79899 Other long term (current) drug therapy: Secondary | ICD-10-CM | POA: Diagnosis not present

## 2024-01-09 DIAGNOSIS — Z6835 Body mass index (BMI) 35.0-35.9, adult: Secondary | ICD-10-CM | POA: Diagnosis not present

## 2024-01-09 DIAGNOSIS — E1165 Type 2 diabetes mellitus with hyperglycemia: Secondary | ICD-10-CM | POA: Diagnosis not present

## 2024-01-09 DIAGNOSIS — K30 Functional dyspepsia: Secondary | ICD-10-CM | POA: Diagnosis present

## 2024-01-09 DIAGNOSIS — F17210 Nicotine dependence, cigarettes, uncomplicated: Secondary | ICD-10-CM | POA: Diagnosis present

## 2024-01-09 DIAGNOSIS — E876 Hypokalemia: Secondary | ICD-10-CM | POA: Diagnosis not present

## 2024-01-09 DIAGNOSIS — R933 Abnormal findings on diagnostic imaging of other parts of digestive tract: Secondary | ICD-10-CM | POA: Diagnosis not present

## 2024-01-09 DIAGNOSIS — R112 Nausea with vomiting, unspecified: Secondary | ICD-10-CM | POA: Diagnosis present

## 2024-01-09 DIAGNOSIS — Z794 Long term (current) use of insulin: Secondary | ICD-10-CM | POA: Diagnosis not present

## 2024-01-09 DIAGNOSIS — Z833 Family history of diabetes mellitus: Secondary | ICD-10-CM | POA: Diagnosis not present

## 2024-01-09 DIAGNOSIS — B379 Candidiasis, unspecified: Secondary | ICD-10-CM | POA: Diagnosis present

## 2024-01-09 DIAGNOSIS — R0789 Other chest pain: Secondary | ICD-10-CM | POA: Diagnosis not present

## 2024-01-09 DIAGNOSIS — J45909 Unspecified asthma, uncomplicated: Secondary | ICD-10-CM | POA: Diagnosis present

## 2024-01-09 DIAGNOSIS — K2100 Gastro-esophageal reflux disease with esophagitis, without bleeding: Secondary | ICD-10-CM | POA: Diagnosis present

## 2024-01-09 DIAGNOSIS — R030 Elevated blood-pressure reading, without diagnosis of hypertension: Secondary | ICD-10-CM | POA: Diagnosis present

## 2024-01-09 LAB — BASIC METABOLIC PANEL WITH GFR
Anion gap: 8 (ref 5–15)
BUN: 17 mg/dL (ref 6–20)
CO2: 25 mmol/L (ref 22–32)
Calcium: 8.5 mg/dL — ABNORMAL LOW (ref 8.9–10.3)
Chloride: 99 mmol/L (ref 98–111)
Creatinine, Ser: 0.95 mg/dL (ref 0.61–1.24)
GFR, Estimated: >60 mL/min (ref >60)
Glucose, Bld: 359 mg/dL — ABNORMAL HIGH (ref 70–99)
Potassium: 3.9 mmol/L (ref 3.5–5.1)
Sodium: 132 mmol/L — ABNORMAL LOW (ref 135–145)

## 2024-01-09 LAB — CBC
HCT: 50.2 % (ref 39.0–52.0)
Hemoglobin: 16.2 g/dL (ref 13.0–17.0)
MCH: 25.9 pg — ABNORMAL LOW (ref 26.0–34.0)
MCHC: 32.3 g/dL (ref 30.0–36.0)
MCV: 80.2 fL (ref 80.0–100.0)
Platelets: 258 10*3/uL (ref 150–400)
RBC: 6.26 MIL/uL — ABNORMAL HIGH (ref 4.22–5.81)
RDW: 14.3 % (ref 11.5–15.5)
WBC: 11.7 10*3/uL — ABNORMAL HIGH (ref 4.0–10.5)
nRBC: 0 % (ref 0.0–0.2)

## 2024-01-09 LAB — GLUCOSE, CAPILLARY
Glucose-Capillary: 262 mg/dL — ABNORMAL HIGH (ref 70–99)
Glucose-Capillary: 298 mg/dL — ABNORMAL HIGH (ref 70–99)
Glucose-Capillary: 319 mg/dL — ABNORMAL HIGH (ref 70–99)
Glucose-Capillary: 298 mg/dL — ABNORMAL HIGH (ref 70–99)
Glucose-Capillary: 282 mg/dL — ABNORMAL HIGH (ref 70–99)

## 2024-01-09 LAB — MAGNESIUM: Magnesium: 1.8 mg/dL (ref 1.7–2.4)

## 2024-01-09 LAB — PHOSPHORUS: Phosphorus: 2.6 mg/dL (ref 2.5–4.6)

## 2024-01-09 MED ORDER — INSULIN GLARGINE-YFGN 100 UNIT/ML ~~LOC~~ SOLN
35.0000 [IU] | Freq: Every day | SUBCUTANEOUS | Status: DC
  Administered 2024-01-09 – 2024-01-10 (×2): 35 [IU] via SUBCUTANEOUS
  Filled 2024-01-09 (×2): qty 0.35

## 2024-01-09 MED ORDER — ENOXAPARIN SODIUM 80 MG/0.8ML IJ SOSY: 80.0000 mg | PREFILLED_SYRINGE | INTRAMUSCULAR | Status: DC

## 2024-01-09 MED ORDER — METOCLOPRAMIDE HCL 5 MG/ML IJ SOLN
5.0000 mg | Freq: Four times a day (QID) | INTRAMUSCULAR | Status: DC
  Administered 2024-01-09 – 2024-01-10 (×4): 5 mg via INTRAVENOUS
  Filled 2024-01-09 (×4): qty 2

## 2024-01-09 NOTE — Progress Notes (Signed)
 PROGRESS NOTE    Shawn Benson  YTK:[id number] DOB: Jun 08, 1981 DOA: 01/07/2024 PCP: Carin Hock, PA    Brief Narrative:   43 year old with history of DM2 with history of gastroparesis comes to the hospital with complaints of ongoing persistent nausea, vomiting for about 48 hours.  CT abdomen pelvis shows concerns of esophagitis and possible early DKA.  Had endoscopy in April 2024 showing normal esophagus and stomach.  Patient admitted to the hospital with concerns of gastroparesis  Assessment & Plan:  Principal Problem:   Nausea & vomiting Active Problems:   Type 2 diabetes mellitus without complication, with long-term current use of insulin (HCC)   Asthma   Morbid obesity due to excess calories (HCC)   Diabetic gastroparesis (HCC)   Elevated blood pressure reading   Intractable nausea vomiting - Suspect secondary to diabetic gastroparesis and cannabis hyperemesis syndrome.  Patient does have prior history of positive gastric emptying study in February 2024.  CT scan today shows esophagitis otherwise unremarkable.  Will continue patient on antiemetics/Reglan and Protonix.  Will slowly advance diet as tolerated.  Elevated anion gap - Initially thought to be possible early DKA versus dehydration.  With IV fluids this has resolved.  Hypokalemia - As needed repletion  Leukocytosis - I suspect this is reactive in nature Trending down  Elevated blood pressure - Start Norvasc.  IV as needed. May swtich to ACE I or ARB upon dc or in the near future.   Uncontrolled diabetes mellitus type 2, insulin-dependent - Previous A1c 11.2.  Will recheck this.  Currently on sliding scale and Accu-Cheks.  Will consult diabetic coordinator  Marijuana use -Counseled to quit using this    DVT prophylaxis: Lovenox    Code Status: Full Code  Ongoing treatment for intractable nausea vomiting- changed to scheduled IV reglan    Subjective: Not eating much per patient an  RN  Examination:   General: Appearance:    Obese male in no acute distress     Lungs:     Clear to auscultation bilaterally, respirations unlabored  Heart:    Normal heart rate.  MS:   All extremities are intact.   Neurologic:   Awake, alert- poor eye contact                 Diet Orders (From admission, onward)     Start     Ordered   01/08/24 0927  Diet Carb Modified Fluid consistency: Thin  Diet effective now       Question Answer Comment  Diet-HS Snack? Snack-yes   Calorie Level Medium 1600-2000   Fluid consistency: Thin      01/08/24 0926            Objective: Vitals:   01/08/24 0830 01/08/24 1251 01/08/24 2002 01/09/24 0524  BP: (!) 172/95 (!) 149/77 112/68 131/86  Pulse: 74 68 78 65  Resp: 16 16 18 19   Temp: 98.8 F (37.1 C) 99.2 F (37.3 C) 99.1 F (37.3 C) 98.3 F (36.8 C)  TempSrc: Oral Oral    SpO2: 97% 93% 98% 100%  Weight:      Height:        Intake/Output Summary (Last 24 hours) at 01/09/2024 1210 Last data filed at 01/09/2024 0020 Gross per 24 hour  Intake 2006 ml  Output 700 ml  Net 1306 ml   Filed Weights   01/08/24 0057  Weight: (!) 143 kg    Scheduled Meds:  amLODipine  5 mg Oral Daily   [  START ON 01/10/2024] enoxaparin (LOVENOX) injection  80 mg Subcutaneous Q24H   gabapentin  300 mg Oral BID   insulin aspart  0-15 Units Subcutaneous TID AC & HS   insulin glargine-yfgn  35 Units Subcutaneous Daily   metoCLOPramide (REGLAN) injection  5 mg Intravenous Q6H   pantoprazole  40 mg Oral BID   rosuvastatin  10 mg Oral Daily   Continuous Infusions:    Nutritional status     Body mass index is 35.52 kg/m.  Data Reviewed:   CBC: Recent Labs  Lab 01/05/24 1815 01/05/24 1825 01/07/24 1345 01/08/24 0419 01/09/24 0417  WBC 13.8*  --  20.7* 17.3* 11.7*  HGB 14.6 15.0 15.8 14.6 16.2  HCT 44.9 44.0 48.6 44.9 50.2  MCV 79.2*  --  79.7* 78.9* 80.2  PLT 262  --  290 256 258   Basic Metabolic Panel: Recent Labs  Lab  01/07/24 1345 01/07/24 2014 01/07/24 2313 01/08/24 0419 01/09/24 0417  NA 135 138 136 137 132*  K 3.7 3.9 3.5 3.4* 3.9  CL 97* 102 103 104 99  CO2 20* 20* 21* 23 25  GLUCOSE 377* 335* 310* 231* 359*  BUN 25* 24* 22* 21* 17  CREATININE 0.92 0.95 0.75 0.70 0.95  CALCIUM 9.4 9.2 8.6* 8.9 8.5*  MG  --   --   --   --  1.8  PHOS  --   --   --   --  2.6   GFR: Estimated Creatinine Clearance: 159.2 mL/min (by C-G formula based on SCr of 0.95 mg/dL). Liver Function Tests: Recent Labs  Lab 01/05/24 1815 01/08/24 0419  AST 10* 10*  ALT 12 13  ALKPHOS 100 83  BILITOT 0.8 1.2  PROT 7.5 6.7  ALBUMIN 3.9 3.4*   Recent Labs  Lab 01/05/24 1815  LIPASE 23   No results for input(s): "AMMONIA" in the last 168 hours. Coagulation Profile: No results for input(s): "INR", "PROTIME" in the last 168 hours. Cardiac Enzymes: No results for input(s): "CKTOTAL", "CKMB", "CKMBINDEX", "TROPONINI" in the last 168 hours. BNP (last 3 results) No results for input(s): "PROBNP" in the last 8760 hours. HbA1C: Recent Labs    01/08/24 0419  HGBA1C 13.2*   CBG: Recent Labs  Lab 01/08/24 1650 01/08/24 2000 01/09/24 0025 01/09/24 0749 01/09/24 1143  GLUCAP 240* 279* 262* 298* 319*   Lipid Profile: No results for input(s): "CHOL", "HDL", "LDLCALC", "TRIG", "CHOLHDL", "LDLDIRECT" in the last 72 hours. Thyroid Function Tests: No results for input(s): "TSH", "T4TOTAL", "FREET4", "T3FREE", "THYROIDAB" in the last 72 hours. Anemia Panel: No results for input(s): "VITAMINB12", "FOLATE", "FERRITIN", "TIBC", "IRON", "RETICCTPCT" in the last 72 hours. Sepsis Labs: No results for input(s): "PROCALCITON", "LATICACIDVEN" in the last 168 hours.  No results found for this or any previous visit (from the past 240 hours).       Radiology Studies: CT ABDOMEN PELVIS W CONTRAST Result Date: 01/07/2024 CLINICAL DATA:  Acute abdominal pain. EXAM: CT ABDOMEN AND PELVIS WITH CONTRAST TECHNIQUE: Multidetector  CT imaging of the abdomen and pelvis was performed using the standard protocol following bolus administration of intravenous contrast. RADIATION DOSE REDUCTION: This exam was performed according to the departmental dose-optimization program which includes automated exposure control, adjustment of the mA and/or kV according to patient size and/or use of iterative reconstruction technique. CONTRAST:  OMNIPAQUE IOHEXOL 300 MG/ML  SOLN COMPARISON:  CT abdomen and pelvis 11/22/2022. FINDINGS: Lower chest: No acute abnormality. Hepatobiliary: No focal liver abnormality is seen. No  gallstones, gallbladder wall thickening, or biliary dilatation. Pancreas: Unremarkable. No pancreatic ductal dilatation or surrounding inflammatory changes. Spleen: Normal in size without focal abnormality. Adrenals/Urinary Tract: Adrenal glands are unremarkable. Kidneys are normal, without renal calculi, focal lesion, or hydronephrosis. Bladder is unremarkable. Stomach/Bowel: Stomach is within normal limits. Appendix appears normal. No evidence of bowel wall thickening, distention, or inflammatory changes. There is diffuse wall thickening and inflammation of the distal esophagus. Vascular/Lymphatic: No significant vascular findings are present. No enlarged abdominal or pelvic lymph nodes. Reproductive: Prostate is unremarkable. Other: No abdominal wall hernia or abnormality. No abdominopelvic ascites. Musculoskeletal: No fracture is seen. IMPRESSION: Diffuse wall thickening and inflammation of the distal esophagus compatible with esophagitis. Consider endoscopy for further evaluation. Electronically Signed   By: Darliss Cheney M.D.   On: 01/07/2024 18:22   DG Chest 2 View Result Date: 01/07/2024 CLINICAL DATA:  Chest pain. EXAM: CHEST - 2 VIEW COMPARISON:  11/26/2022. FINDINGS: Bilateral lung fields are clear. Bilateral costophrenic angles are clear. Normal cardio-mediastinal silhouette. No acute osseous abnormalities. The soft tissues  are within normal limits. IMPRESSION: No active cardiopulmonary disease. Electronically Signed   By: Jules Schick M.D.   On: 01/07/2024 17:10           LOS: 0 days   Time spent= 35 mins    Joseph Art, DO [facility]  If 7PM-7AM, please contact night-coverage  01/09/2024, 12:10 PM

## 2024-01-09 NOTE — Plan of Care (Signed)

## 2024-01-09 NOTE — Inpatient Diabetes Management (Signed)
 Inpatient Diabetes Program Recommendations  AACE/ADA: New Consensus Statement on Inpatient Glycemic Control (2015)  Target Ranges:  Prepandial:   less than 140 mg/dL      Peak postprandial:   less than 180 mg/dL (1-2 hours)      Critically ill patients:  140 - 180 mg/dL   Lab Results  Component Value Date   GLUCAP 298 (H) 01/09/2024   HGBA1C 13.2 (H) 01/08/2024    Review of Glycemic Control  Latest Reference Range & Units 01/08/24 20:00 01/09/24 00:25 01/09/24 07:49  Glucose-Capillary 70 - 99 mg/dL 409 (H) 811 (H) 914 (H)  (H): Data is abnormally high Diabetes history: DM2 Outpatient Diabetes medications: Toujeo 70 units every day, Humalog 6-20 units TID Current orders for Inpatient glycemic control: Novolog 0-15 units TID  Inpatient Diabetes Program Recommendations:    Semglee 35 units every day (50% of home dose).  Will continue to follow while inpatient.  Thank you, Dulce Sellar, MSN, CDCES Diabetes Coordinator Inpatient Diabetes Program [phone] (team pager from 8a-5p)

## 2024-01-10 DIAGNOSIS — E1310 Other specified diabetes mellitus with ketoacidosis without coma: Secondary | ICD-10-CM | POA: Diagnosis not present

## 2024-01-10 DIAGNOSIS — R1110 Vomiting, unspecified: Secondary | ICD-10-CM | POA: Diagnosis not present

## 2024-01-10 DIAGNOSIS — K209 Esophagitis, unspecified without bleeding: Secondary | ICD-10-CM | POA: Diagnosis not present

## 2024-01-10 LAB — CBC
HCT: 46.3 % (ref 39.0–52.0)
Hemoglobin: 14.9 g/dL (ref 13.0–17.0)
MCH: 25.6 pg — ABNORMAL LOW (ref 26.0–34.0)
MCHC: 32.2 g/dL (ref 30.0–36.0)
MCV: 79.6 fL — ABNORMAL LOW (ref 80.0–100.0)
Platelets: 227 10*3/uL (ref 150–400)
RBC: 5.82 MIL/uL — ABNORMAL HIGH (ref 4.22–5.81)
RDW: 14.2 % (ref 11.5–15.5)
WBC: 9.8 10*3/uL (ref 4.0–10.5)
nRBC: 0 % (ref 0.0–0.2)

## 2024-01-10 LAB — BASIC METABOLIC PANEL WITH GFR
Anion gap: 11 (ref 5–15)
BUN: 11 mg/dL (ref 6–20)
CO2: 24 mmol/L (ref 22–32)
Calcium: 8.6 mg/dL — ABNORMAL LOW (ref 8.9–10.3)
Chloride: 102 mmol/L (ref 98–111)
Creatinine, Ser: 0.61 mg/dL (ref 0.61–1.24)
GFR, Estimated: >60 mL/min (ref >60)
Glucose, Bld: 278 mg/dL — ABNORMAL HIGH (ref 70–99)
Potassium: 2.9 mmol/L — ABNORMAL LOW (ref 3.5–5.1)
Sodium: 137 mmol/L (ref 135–145)

## 2024-01-10 LAB — GLUCOSE, CAPILLARY
Glucose-Capillary: 291 mg/dL — ABNORMAL HIGH (ref 70–99)
Glucose-Capillary: 304 mg/dL — ABNORMAL HIGH (ref 70–99)
Glucose-Capillary: 230 mg/dL — ABNORMAL HIGH (ref 70–99)
Glucose-Capillary: 171 mg/dL — ABNORMAL HIGH (ref 70–99)

## 2024-01-10 LAB — MAGNESIUM: Magnesium: 2 mg/dL (ref 1.7–2.4)

## 2024-01-10 MED ORDER — POTASSIUM CHLORIDE 10 MEQ/100ML IV SOLN
10.0000 meq | INTRAVENOUS | Status: AC
  Administered 2024-01-10 (×6): 10 meq via INTRAVENOUS
  Filled 2024-01-10 (×6): qty 100

## 2024-01-10 MED ORDER — INSULIN GLARGINE-YFGN 100 UNIT/ML ~~LOC~~ SOLN
60.0000 [IU] | Freq: Every day | SUBCUTANEOUS | Status: DC
  Administered 2024-01-11 – 2024-01-14 (×4): 60 [IU] via SUBCUTANEOUS
  Filled 2024-01-10 (×4): qty 0.6

## 2024-01-10 MED ORDER — NYSTATIN 100000 UNIT/ML MT SUSP
5.0000 mL | Freq: Four times a day (QID) | OROMUCOSAL | Status: DC
  Administered 2024-01-10 – 2024-01-14 (×15): 500000 [IU] via ORAL
  Filled 2024-01-10 (×16): qty 5

## 2024-01-10 MED ORDER — ENOXAPARIN SODIUM 80 MG/0.8ML IJ SOSY
70.0000 mg | PREFILLED_SYRINGE | INTRAMUSCULAR | Status: DC
  Administered 2024-01-10 – 2024-01-14 (×5): 70 mg via SUBCUTANEOUS
  Filled 2024-01-10 (×5): qty 0.8

## 2024-01-10 MED ORDER — POTASSIUM CHLORIDE CRYS ER 20 MEQ PO TBCR: 40.0000 meq | EXTENDED_RELEASE_TABLET | ORAL | Status: DC

## 2024-01-10 MED ORDER — ALUM & MAG HYDROXIDE-SIMETH 200-200-20 MG/5ML PO SUSP
15.0000 mL | ORAL | Status: DC | PRN
  Administered 2024-01-10 (×2): 15 mL via ORAL
  Filled 2024-01-10 (×3): qty 30

## 2024-01-10 MED ORDER — FLUCONAZOLE IN SODIUM CHLORIDE 200-0.9 MG/100ML-% IV SOLN
200.0000 mg | Freq: Once | INTRAVENOUS | Status: AC
  Administered 2024-01-10: 200 mg via INTRAVENOUS
  Filled 2024-01-10: qty 100

## 2024-01-10 MED ORDER — INSULIN GLARGINE-YFGN 100 UNIT/ML ~~LOC~~ SOLN
25.0000 [IU] | Freq: Once | SUBCUTANEOUS | Status: AC
  Administered 2024-01-10: 25 [IU] via SUBCUTANEOUS
  Filled 2024-01-10: qty 0.25

## 2024-01-10 MED ORDER — METOCLOPRAMIDE HCL 5 MG/ML IJ SOLN
10.0000 mg | Freq: Four times a day (QID) | INTRAMUSCULAR | Status: AC
  Administered 2024-01-10 – 2024-01-11 (×4): 10 mg via INTRAVENOUS
  Filled 2024-01-10 (×4): qty 2

## 2024-01-10 NOTE — Inpatient Diabetes Management (Signed)
 Inpatient Diabetes Program Recommendations  AACE/ADA: New Consensus Statement on Inpatient Glycemic Control (2015)  Target Ranges:  Prepandial:   less than 140 mg/dL      Peak postprandial:   less than 180 mg/dL (1-2 hours)      Critically ill patients:  140 - 180 mg/dL   Lab Results  Component Value Date   GLUCAP 291 (H) 01/10/2024   HGBA1C 13.2 (H) 01/08/2024    Review of Glycemic Control  Latest Reference Range & Units 01/09/24 07:49 01/09/24 11:43 01/09/24 16:59 01/09/24 20:48 01/10/24 07:50  Glucose-Capillary 70 - 99 mg/dL 045 (H) 409 (H) 811 (H) 282 (H) 291 (H)  (H): Data is abnormally high  Diabetes history: DM2 Outpatient Diabetes medications: Toujeo 70 units every day, Humalog 6-20 units TID Current orders for Inpatient glycemic control: Novolog 0-15 units TID, Semglee 35 units QD  Inpatient Diabetes Program Recommendations:    Semglee 60 units every day (could administer additional 25 units now).  Will continue to follow while inpatient.  Thank you, Dulce Sellar, MSN, CDCES Diabetes Coordinator Inpatient Diabetes Program [phone] (team pager from 8a-5p)

## 2024-01-10 NOTE — Progress Notes (Signed)
 PROGRESS NOTE    Shawn Benson  ZOX:[id number] DOB: November 28, 1980 DOA: 01/07/2024 PCP: Carin Hock, PA    Brief Narrative:   43 year old with history of DM2 with history of gastroparesis comes to the hospital with complaints of ongoing persistent nausea, vomiting for about 48 hours.  CT abdomen pelvis shows concerns of esophagitis and possible early DKA.  Had endoscopy in April 2024 showing normal esophagus and stomach.  Patient admitted to the hospital with concerns of gastroparesis.    Assessment & Plan:  Principal Problem:   Nausea & vomiting Active Problems:   Type 2 diabetes mellitus without complication, with long-term current use of insulin (HCC)   Asthma   Morbid obesity due to excess calories (HCC)   Diabetic gastroparesis (HCC)   Elevated blood pressure reading   Intractable nausea vomiting - Suspect secondary to diabetic gastroparesis and cannabis hyperemesis syndrome.  Patient does have prior history of positive gastric emptying study in February 2024.  CT scan today shows esophagitis otherwise unremarkable.  Will continue patient on antiemetics/Reglan and Protonix.  Will slowly advance diet as tolerated.  ? Thrush -nystatin swish and swallow -IV diflucan x 1   Elevated anion gap - Initially thought to be possible early DKA versus dehydration.  With IV fluids this has resolved.  Hypokalemia -replete  Leukocytosis - I suspect this is reactive in nature Trending down  Elevated blood pressure - Start Norvasc.  IV as needed. May swtich to ACE I or ARB upon dc or in the near future.   Uncontrolled diabetes mellitus type 2, insulin-dependent - Previous A1c 11.2.  Will recheck this.  Currently on sliding scale and Accu-Cheks.  Will consult diabetic coordinator  Marijuana use -Counseled to quit using this    DVT prophylaxis: Lovenox    Code Status: Full Code  Ongoing treatment for intractable nausea vomiting- changed to scheduled IV  reglan    Subjective: C/o burning in chest  Examination:   General: Appearance:    Obese male in no acute distress     Lungs:     Clear to auscultation bilaterally, respirations unlabored  Heart:    Normal heart rate.  MS:   All extremities are intact.   Neurologic:   Awake, alert-             Objective: Vitals:   01/09/24 1251 01/09/24 1941 01/10/24 0611 01/10/24 1259  BP: 138/84 138/75 113/77 118/80  Pulse: 66 65 72 66  Resp: 20 16 16 20   Temp: 98.4 F (36.9 C) 98.9 F (37.2 C) 98.2 F (36.8 C) 98.6 F (37 C)  TempSrc: Oral Oral  Oral  SpO2: 100% 96% 95% 97%  Weight:      Height:        Intake/Output Summary (Last 24 hours) at 01/10/2024 1349 Last data filed at 01/09/2024 2200 Gross per 24 hour  Intake 240 ml  Output --  Net 240 ml   Filed Weights   01/08/24 0057  Weight: (!) 143 kg    Scheduled Meds:  amLODipine  5 mg Oral Daily   enoxaparin (LOVENOX) injection  70 mg Subcutaneous Q24H   gabapentin  300 mg Oral BID   insulin aspart  0-15 Units Subcutaneous TID AC & HS   insulin glargine-yfgn  35 Units Subcutaneous Daily   metoCLOPramide (REGLAN) injection  10 mg Intravenous Q6H   nystatin  5 mL Oral QID   pantoprazole  40 mg Oral BID   rosuvastatin  10 mg Oral Daily  Continuous Infusions:  potassium chloride 10 mEq (01/10/24 1323)     Nutritional status     Body mass index is 35.52 kg/m.  Data Reviewed:   CBC: Recent Labs  Lab 01/05/24 1815 01/05/24 1825 01/07/24 1345 01/08/24 0419 01/09/24 0417 01/10/24 0407  WBC 13.8*  --  20.7* 17.3* 11.7* 9.8  HGB 14.6 15.0 15.8 14.6 16.2 14.9  HCT 44.9 44.0 48.6 44.9 50.2 46.3  MCV 79.2*  --  79.7* 78.9* 80.2 79.6*  PLT 262  --  290 256 258 227   Basic Metabolic Panel: Recent Labs  Lab 01/07/24 2014 01/07/24 2313 01/08/24 0419 01/09/24 0417 01/10/24 0407  NA 138 136 137 132* 137  K 3.9 3.5 3.4* 3.9 2.9*  CL 102 103 104 99 102  CO2 20* 21* 23 25 24   GLUCOSE 335* 310* 231*  359* 278*  BUN 24* 22* 21* 17 11  CREATININE 0.95 0.75 0.70 0.95 0.61  CALCIUM 9.2 8.6* 8.9 8.5* 8.6*  MG  --   --   --  1.8 2.0  PHOS  --   --   --  2.6  --    GFR: Estimated Creatinine Clearance: 189 mL/min (by C-G formula based on SCr of 0.61 mg/dL). Liver Function Tests: Recent Labs  Lab 01/05/24 1815 01/08/24 0419  AST 10* 10*  ALT 12 13  ALKPHOS 100 83  BILITOT 0.8 1.2  PROT 7.5 6.7  ALBUMIN 3.9 3.4*   Recent Labs  Lab 01/05/24 1815  LIPASE 23   No results for input(s): "AMMONIA" in the last 168 hours. Coagulation Profile: No results for input(s): "INR", "PROTIME" in the last 168 hours. Cardiac Enzymes: No results for input(s): "CKTOTAL", "CKMB", "CKMBINDEX", "TROPONINI" in the last 168 hours. BNP (last 3 results) No results for input(s): "PROBNP" in the last 8760 hours. HbA1C: Recent Labs    01/08/24 0419  HGBA1C 13.2*   CBG: Recent Labs  Lab 01/09/24 1143 01/09/24 1659 01/09/24 2048 01/10/24 0750 01/10/24 1207  GLUCAP 319* 298* 282* 291* 304*   Lipid Profile: No results for input(s): "CHOL", "HDL", "LDLCALC", "TRIG", "CHOLHDL", "LDLDIRECT" in the last 72 hours. Thyroid Function Tests: No results for input(s): "TSH", "T4TOTAL", "FREET4", "T3FREE", "THYROIDAB" in the last 72 hours. Anemia Panel: No results for input(s): "VITAMINB12", "FOLATE", "FERRITIN", "TIBC", "IRON", "RETICCTPCT" in the last 72 hours. Sepsis Labs: No results for input(s): "PROCALCITON", "LATICACIDVEN" in the last 168 hours.  No results found for this or any previous visit (from the past 240 hours).       Radiology Studies: No results found.          LOS: 1 day   Time spent= 35 mins    Joseph Art, DO [facility]  If 7PM-7AM, please contact night-coverage  01/10/2024, 1:49 PM

## 2024-01-10 NOTE — TOC CM/SW Note (Signed)
 Transition of Care [facility]) - Inpatient Brief Assessment   Patient Details  Name: Shawn Benson MRN: [id number] Date of Birth: March 17, 1981  Transition of Care [facility]) CM/SW Contact:    Larrie Kass, LCSW Phone Number: 01/10/2024, 9:48 AM   Clinical Narrative: Met with pt to discuss SDOH concerns pt stated it was months ago, he is in stable housing and has reliable transportation now. No further TOC needs, TOC sign off.     Transition of Care Asessment: Insurance and Status: Insurance coverage has been reviewed Patient has primary care physician: Yes Home environment has been reviewed: home with SO Prior level of function:: independent Prior/Current Home Services: No current home services Social Drivers of Health Review: SDOH reviewed no interventions necessary Readmission risk has been reviewed: Yes Transition of care needs: no transition of care needs at this time

## 2024-01-11 DIAGNOSIS — K209 Esophagitis, unspecified without bleeding: Secondary | ICD-10-CM | POA: Diagnosis not present

## 2024-01-11 DIAGNOSIS — E1310 Other specified diabetes mellitus with ketoacidosis without coma: Secondary | ICD-10-CM | POA: Diagnosis not present

## 2024-01-11 DIAGNOSIS — R1110 Vomiting, unspecified: Secondary | ICD-10-CM | POA: Diagnosis not present

## 2024-01-11 LAB — GLUCOSE, CAPILLARY
Glucose-Capillary: 204 mg/dL — ABNORMAL HIGH (ref 70–99)
Glucose-Capillary: 287 mg/dL — ABNORMAL HIGH (ref 70–99)
Glucose-Capillary: 219 mg/dL — ABNORMAL HIGH (ref 70–99)

## 2024-01-11 LAB — CBC
HCT: 45.1 % (ref 39.0–52.0)
Hemoglobin: 14.7 g/dL (ref 13.0–17.0)
MCH: 25.7 pg — ABNORMAL LOW (ref 26.0–34.0)
MCHC: 32.6 g/dL (ref 30.0–36.0)
MCV: 79 fL — ABNORMAL LOW (ref 80.0–100.0)
Platelets: 237 10*3/uL (ref 150–400)
RBC: 5.71 MIL/uL (ref 4.22–5.81)
RDW: 14.1 % (ref 11.5–15.5)
WBC: 11.3 10*3/uL — ABNORMAL HIGH (ref 4.0–10.5)
nRBC: 0 % (ref 0.0–0.2)

## 2024-01-11 LAB — BASIC METABOLIC PANEL WITH GFR
Anion gap: 11 (ref 5–15)
BUN: 9 mg/dL (ref 6–20)
CO2: 24 mmol/L (ref 22–32)
Calcium: 8.6 mg/dL — ABNORMAL LOW (ref 8.9–10.3)
Chloride: 104 mmol/L (ref 98–111)
Creatinine, Ser: 0.65 mg/dL (ref 0.61–1.24)
GFR, Estimated: >60 mL/min (ref >60)
Glucose, Bld: 156 mg/dL — ABNORMAL HIGH (ref 70–99)
Potassium: 3 mmol/L — ABNORMAL LOW (ref 3.5–5.1)
Sodium: 139 mmol/L (ref 135–145)

## 2024-01-11 LAB — MAGNESIUM: Magnesium: 1.8 mg/dL (ref 1.7–2.4)

## 2024-01-11 MED ORDER — MAGNESIUM SULFATE 2 GM/50ML IV SOLN
2.0000 g | Freq: Once | INTRAVENOUS | Status: AC
  Administered 2024-01-11: 2 g via INTRAVENOUS
  Filled 2024-01-11: qty 50

## 2024-01-11 MED ORDER — POTASSIUM CHLORIDE CRYS ER 20 MEQ PO TBCR
40.0000 meq | EXTENDED_RELEASE_TABLET | Freq: Once | ORAL | Status: AC
  Administered 2024-01-11: 40 meq via ORAL
  Filled 2024-01-11: qty 2

## 2024-01-11 MED ORDER — FLUCONAZOLE IN SODIUM CHLORIDE 200-0.9 MG/100ML-% IV SOLN
200.0000 mg | Freq: Once | INTRAVENOUS | Status: AC
  Administered 2024-01-11: 200 mg via INTRAVENOUS
  Filled 2024-01-11: qty 100

## 2024-01-11 NOTE — Progress Notes (Signed)
 PROGRESS NOTE    Shawn Benson  ZOX:[id number] DOB: May 25, 1981 DOA: 01/07/2024 PCP: Carin Hock, PA    Brief Narrative:   43 year old with history of DM2 with history of gastroparesis comes to the hospital with complaints of ongoing persistent nausea, vomiting for about 48 hours.  CT abdomen pelvis shows concerns of esophagitis and possible early DKA.  Had endoscopy in April 2024 showing normal esophagus and stomach.  Patient admitted to the hospital with concerns of gastroparesis.    Assessment & Plan:  Principal Problem:   Nausea & vomiting Active Problems:   Type 2 diabetes mellitus without complication, with long-term current use of insulin (HCC)   Asthma   Morbid obesity due to excess calories (HCC)   Diabetic gastroparesis (HCC)   Elevated blood pressure reading   Intractable nausea vomiting - Suspect secondary to diabetic gastroparesis and cannabis hyperemesis syndrome.  Patient does have prior history of positive gastric emptying study in February 2024.  CT scan today shows esophagitis otherwise unremarkable.  Will continue patient on antiemetics/Reglan and Protonix   ? Thrush -nystatin swish and swallow -IV diflucan x 1 again   Elevated anion gap - Initially thought to be possible early DKA versus dehydration.   -With IV fluids this has resolved.  Hypokalemia -replete again  Leukocytosis - I suspect this is reactive in nature Trending down  Elevated blood pressure - Start Norvasc.  IV as needed. May swtich to ACE I or ARB upon dc or in the near future.   Uncontrolled diabetes mellitus type 2, insulin-dependent - Previous A1c 11.2.  Will recheck this.  Currently on sliding scale and Accu-Cheks.  Will consult diabetic coordinator  Marijuana use -Counseled to quit using this    DVT prophylaxis: Lovenox    Code Status: Full Code      Subjective:  burning in chest has improved today  Examination:   General: Appearance:    Obese male in no  acute distress     Lungs:     Clear to auscultation bilaterally, respirations unlabored  Heart:    Normal heart rate.  MS:   All extremities are intact.   Neurologic:   Awake, alert-             Objective: Vitals:   01/10/24 0611 01/10/24 1259 01/10/24 2058 01/11/24 0447  BP: 113/77 118/80 138/80 131/67  Pulse: 72 66 65 78  Resp: 16 20 (!) 22 20  Temp: 98.2 F (36.8 C) 98.6 F (37 C) 98.6 F (37 C) 98.7 F (37.1 C)  TempSrc:  Oral Oral Oral  SpO2: 95% 97% 96% 97%  Weight:      Height:        Intake/Output Summary (Last 24 hours) at 01/11/2024 1129 Last data filed at 01/11/2024 0500 Gross per 24 hour  Intake 318.58 ml  Output 750 ml  Net -431.42 ml   Filed Weights   01/08/24 0057  Weight: (!) 143 kg    Scheduled Meds:  amLODipine  5 mg Oral Daily   enoxaparin (LOVENOX) injection  70 mg Subcutaneous Q24H   gabapentin  300 mg Oral BID   insulin aspart  0-15 Units Subcutaneous TID AC & HS   insulin glargine-yfgn  60 Units Subcutaneous Daily   nystatin  5 mL Oral QID   pantoprazole  40 mg Oral BID   potassium chloride  40 mEq Oral Once   rosuvastatin  10 mg Oral Daily   Continuous Infusions:  fluconazole (DIFLUCAN) IV  Nutritional status     Body mass index is 35.52 kg/m.  Data Reviewed:   CBC: Recent Labs  Lab 01/07/24 1345 01/08/24 0419 01/09/24 0417 01/10/24 0407 01/11/24 0338  WBC 20.7* 17.3* 11.7* 9.8 11.3*  HGB 15.8 14.6 16.2 14.9 14.7  HCT 48.6 44.9 50.2 46.3 45.1  MCV 79.7* 78.9* 80.2 79.6* 79.0*  PLT 290 256 258 227 237   Basic Metabolic Panel: Recent Labs  Lab 01/07/24 2313 01/08/24 0419 01/09/24 0417 01/10/24 0407 01/11/24 0338  NA 136 137 132* 137 139  K 3.5 3.4* 3.9 2.9* 3.0*  CL 103 104 99 102 104  CO2 21* 23 25 24 24   GLUCOSE 310* 231* 359* 278* 156*  BUN 22* 21* 17 11 9   CREATININE 0.75 0.70 0.95 0.61 0.65  CALCIUM 8.6* 8.9 8.5* 8.6* 8.6*  MG  --   --  1.8 2.0 1.8  PHOS  --   --  2.6  --   --     GFR: Estimated Creatinine Clearance: 189 mL/min (by C-G formula based on SCr of 0.65 mg/dL). Liver Function Tests: Recent Labs  Lab 01/05/24 1815 01/08/24 0419  AST 10* 10*  ALT 12 13  ALKPHOS 100 83  BILITOT 0.8 1.2  PROT 7.5 6.7  ALBUMIN 3.9 3.4*   Recent Labs  Lab 01/05/24 1815  LIPASE 23   No results for input(s): "AMMONIA" in the last 168 hours. Coagulation Profile: No results for input(s): "INR", "PROTIME" in the last 168 hours. Cardiac Enzymes: No results for input(s): "CKTOTAL", "CKMB", "CKMBINDEX", "TROPONINI" in the last 168 hours. BNP (last 3 results) No results for input(s): "PROBNP" in the last 8760 hours. HbA1C: No results for input(s): "HGBA1C" in the last 72 hours.  CBG: Recent Labs  Lab 01/10/24 0750 01/10/24 1207 01/10/24 1712 01/10/24 2055 01/11/24 0743  GLUCAP 291* 304* 230* 171* 204*   Lipid Profile: No results for input(s): "CHOL", "HDL", "LDLCALC", "TRIG", "CHOLHDL", "LDLDIRECT" in the last 72 hours. Thyroid Function Tests: No results for input(s): "TSH", "T4TOTAL", "FREET4", "T3FREE", "THYROIDAB" in the last 72 hours. Anemia Panel: No results for input(s): "VITAMINB12", "FOLATE", "FERRITIN", "TIBC", "IRON", "RETICCTPCT" in the last 72 hours. Sepsis Labs: No results for input(s): "PROCALCITON", "LATICACIDVEN" in the last 168 hours.  No results found for this or any previous visit (from the past 240 hours).       Radiology Studies: No results found.          LOS: 2 days   Time spent= 35 mins    Joseph Art, DO [facility]  If 7PM-7AM, please contact night-coverage  01/11/2024, 11:29 AM

## 2024-01-12 DIAGNOSIS — R0789 Other chest pain: Secondary | ICD-10-CM | POA: Diagnosis not present

## 2024-01-12 DIAGNOSIS — R112 Nausea with vomiting, unspecified: Secondary | ICD-10-CM | POA: Diagnosis not present

## 2024-01-12 DIAGNOSIS — R109 Unspecified abdominal pain: Secondary | ICD-10-CM | POA: Diagnosis not present

## 2024-01-12 DIAGNOSIS — R933 Abnormal findings on diagnostic imaging of other parts of digestive tract: Secondary | ICD-10-CM

## 2024-01-12 DIAGNOSIS — E1310 Other specified diabetes mellitus with ketoacidosis without coma: Secondary | ICD-10-CM | POA: Diagnosis not present

## 2024-01-12 DIAGNOSIS — R1110 Vomiting, unspecified: Secondary | ICD-10-CM | POA: Diagnosis not present

## 2024-01-12 DIAGNOSIS — R1310 Dysphagia, unspecified: Secondary | ICD-10-CM | POA: Diagnosis not present

## 2024-01-12 DIAGNOSIS — Z8719 Personal history of other diseases of the digestive system: Secondary | ICD-10-CM

## 2024-01-12 DIAGNOSIS — F1290 Cannabis use, unspecified, uncomplicated: Secondary | ICD-10-CM

## 2024-01-12 DIAGNOSIS — K5900 Constipation, unspecified: Secondary | ICD-10-CM

## 2024-01-12 DIAGNOSIS — K30 Functional dyspepsia: Secondary | ICD-10-CM

## 2024-01-12 DIAGNOSIS — K209 Esophagitis, unspecified without bleeding: Secondary | ICD-10-CM

## 2024-01-12 LAB — GLUCOSE, CAPILLARY
Glucose-Capillary: 225 mg/dL — ABNORMAL HIGH (ref 70–99)
Glucose-Capillary: 147 mg/dL — ABNORMAL HIGH (ref 70–99)
Glucose-Capillary: 251 mg/dL — ABNORMAL HIGH (ref 70–99)
Glucose-Capillary: 278 mg/dL — ABNORMAL HIGH (ref 70–99)

## 2024-01-12 LAB — BASIC METABOLIC PANEL WITH GFR
Anion gap: 8 (ref 5–15)
BUN: 8 mg/dL (ref 6–20)
CO2: 28 mmol/L (ref 22–32)
Calcium: 8.5 mg/dL — ABNORMAL LOW (ref 8.9–10.3)
Chloride: 102 mmol/L (ref 98–111)
Creatinine, Ser: 0.81 mg/dL (ref 0.61–1.24)
GFR, Estimated: >60 mL/min (ref >60)
Glucose, Bld: 194 mg/dL — ABNORMAL HIGH (ref 70–99)
Potassium: 3.6 mmol/L (ref 3.5–5.1)
Sodium: 138 mmol/L (ref 135–145)

## 2024-01-12 LAB — CBC
HCT: 45.7 % (ref 39.0–52.0)
Hemoglobin: 14.9 g/dL (ref 13.0–17.0)
MCH: 26.1 pg (ref 26.0–34.0)
MCHC: 32.6 g/dL (ref 30.0–36.0)
MCV: 80.2 fL (ref 80.0–100.0)
Platelets: 234 10*3/uL (ref 150–400)
RBC: 5.7 MIL/uL (ref 4.22–5.81)
RDW: 14.4 % (ref 11.5–15.5)
WBC: 10.5 10*3/uL (ref 4.0–10.5)
nRBC: 0 % (ref 0.0–0.2)

## 2024-01-12 LAB — MAGNESIUM: Magnesium: 2.1 mg/dL (ref 1.7–2.4)

## 2024-01-12 MED ORDER — INSULIN ASPART 100 UNIT/ML IJ SOLN
0.0000 [IU] | INTRAMUSCULAR | Status: DC
  Administered 2024-01-12: 7 [IU] via SUBCUTANEOUS
  Administered 2024-01-12: 11 [IU] via SUBCUTANEOUS
  Administered 2024-01-12: 3 [IU] via SUBCUTANEOUS
  Administered 2024-01-12: 11 [IU] via SUBCUTANEOUS
  Administered 2024-01-13: 4 [IU] via SUBCUTANEOUS
  Administered 2024-01-13: 7 [IU] via SUBCUTANEOUS
  Administered 2024-01-13: 4 [IU] via SUBCUTANEOUS
  Administered 2024-01-13: 7 [IU] via SUBCUTANEOUS
  Administered 2024-01-13: 11 [IU] via SUBCUTANEOUS
  Administered 2024-01-14: 4 [IU] via SUBCUTANEOUS
  Administered 2024-01-14: 15 [IU] via SUBCUTANEOUS

## 2024-01-12 MED ORDER — PROCHLORPERAZINE EDISYLATE 10 MG/2ML IJ SOLN
10.0000 mg | Freq: Four times a day (QID) | INTRAMUSCULAR | Status: DC | PRN
  Administered 2024-01-12: 10 mg via INTRAVENOUS
  Filled 2024-01-12: qty 2

## 2024-01-12 MED ORDER — KETOROLAC TROMETHAMINE 15 MG/ML IJ SOLN
15.0000 mg | Freq: Once | INTRAMUSCULAR | Status: AC
Start: 2024-01-12 — End: 2024-01-12
  Administered 2024-01-12: 15 mg via INTRAVENOUS
  Filled 2024-01-12: qty 1

## 2024-01-12 MED ORDER — POTASSIUM CHLORIDE CRYS ER 20 MEQ PO TBCR
40.0000 meq | EXTENDED_RELEASE_TABLET | Freq: Once | ORAL | Status: AC
  Administered 2024-01-12: 40 meq via ORAL
  Filled 2024-01-12: qty 2

## 2024-01-12 MED ORDER — MUSCLE RUB 10-15 % EX CREA
TOPICAL_CREAM | CUTANEOUS | Status: DC | PRN
  Filled 2024-01-12: qty 85

## 2024-01-12 MED ORDER — HYDROXYZINE HCL 10 MG PO TABS
10.0000 mg | ORAL_TABLET | Freq: Once | ORAL | Status: AC | PRN
  Administered 2024-01-12: 10 mg via ORAL
  Filled 2024-01-12: qty 1

## 2024-01-12 NOTE — Consult Note (Signed)
 Inpatient Consultation   Referring Provider:     Dr. Marlin Canary Primary Care Physician:  Carin Hock, PA Primary Gastroenterologist:       Dr. Tiajuana Amass Reason for Consultation:    Nausea, vomiting, gastroparesis/delayed gastric emptying         HPI  Shawn Benson is a 43 y.o. 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  In speaking with Shawn as well as reviewing his prior medical records it is noteworthy that he has had chronic symptoms of nausea and vomiting.  He has been followed by Dr. Tomasa Rand and previous evaluation has shown the following:  CTAP 11/2022 - normal Barium esophagram 11/2022 - small hiatal hernia Solid-phase GES 11/2022 -slight delay in gastric emptying EGD 01/2023 showing a normal stomach, esophagus and duodenum-biopsies negative for H. pylori.  Clinical constellation of symptoms has primarily been attributed to GERD, delayed gastric emptying and possible cannabinoid hyperemesis.  Previously managed as an outpatient on pantoprazole 40 mg orally daily and Reglan 10 mg p.o. twice daily to 3 times daily in conjunction with a gastroparesis diet.  Reports that he brought healthy abrupt onset of severe nausea and vomiting 01/05/2024.  States he was vomiting up to 10 times a day.  Subsequently developed epigastric abdominal pain as well as chest pain and odynophagia.  States that his diabetes has "been out of control" in the past.  Denies any diarrhea.  States that he had not had a bowel movement for 1 week.  Denies antecedent infectious illnesses.  Had not traveled or taken any new medications prior to the onset of his symptoms.  Reports consuming marijuana on a daily basis until his hospitalization.  Denies the evolution of any new focal neurologic deficits to  suggest a CNS etiology of his symptoms.  This hospitalization he underwent CTAP 01/07/24 showing diffuse wall thickening and inflammation of the distal esophagus compatible with esophagitis.  He has been managed with IV Protonix, Reglan and Diflucan for possible Candida esophagitis.  States that he thinks his symptoms have slightly improved over the last 2 days.   Past Medical History:  Diagnosis Date   Allergy    Seasonal--spring through fall.   Asthma childhood and adult   triggers are allergies and URI.  Spring, summer and fall.   Diabetes mellitus without complication (HCC)    Morbid obesity (HCC)    Peripheral edema 04/09/2017    Past Surgical History:  Procedure Laterality Date   INCISION AND DRAINAGE PERIRECTAL ABSCESS N/A 10/06/2015   Procedure: IRRIGATION AND DEBRIDEMENT PERIANAL  ABSCESS;  Surgeon: Ovidio Kin, MD;  Location: [facility];  Service: General;  Laterality: N/A;   KNEE SURGERY     Right and Left Knee   UPPER GASTROINTESTINAL ENDOSCOPY  01/08/2023    Family History  Problem Relation Age of Onset   Diabetes Mother    Diabetes Father    Diabetes Brother    Diabetes Brother    Colon cancer Neg Hx    Stomach cancer Neg Hx    Esophageal cancer Neg Hx     Social History   Tobacco Use   Smoking status: Every Day    Types: Cigarettes   Smokeless tobacco: Never   Tobacco comments:        06/04/23 Patient states he smokes about  5 cigarettes a day.        07/18/2022 patient states he smokes 1/2 pack daily  Vaping Use   Vaping status: Some Days   Substances: Nicotine, Flavoring  Substance Use Topics   Alcohol use: Yes    Alcohol/week: 0.0 standard drinks of alcohol    Comment: occasional beer   Drug use: Yes    Types: Marijuana    Prior to Admission medications   Medication Sig Start Date End Date Taking? Authorizing Provider  gabapentin (NEURONTIN) 300 MG capsule Take 300 mg by mouth 2 (two) times daily. 12/11/22  Yes [provider]  insulin  glargine, 2 Unit Dial, (TOUJEO MAX SOLOSTAR) 300 UNIT/ML Solostar Pen Inject 70 Units into the skin daily in the afternoon. 06/03/23  Yes Shamleffer, Konrad Dolores, MD  insulin lispro (HUMALOG KWIKPEN) 100 UNIT/ML KwikPen Max daily 30 units Patient taking differently: Inject 0-12 Units into the skin in the morning and at bedtime. 06/03/23  Yes Shamleffer, Konrad Dolores, MD  metoCLOPramide (REGLAN) 10 MG tablet Take 1 tablet (10 mg total) by mouth 3 (three) times daily with meals. Patient taking differently: Take 10 mg by mouth daily. 11/30/22 01/08/24 Yes Noralee Stain, DO  Continuous Glucose Sensor (DEXCOM G7 SENSOR) MISC Change sensors every 10 days 06/06/23   Shamleffer, Konrad Dolores, MD  ergocalciferol (VITAMIN D2) 1.25 MG (50000 UT) capsule 1 capsule right after a meal by mouth once a week Patient not taking: Reported on 01/08/2024 09/12/23   [provider]  Insulin Pen Needle (B-D ULTRAFINE III SHORT PEN) 31G X 8 MM MISC 1 Device by Does not apply route in the morning, at noon, in the evening, and at bedtime. 06/03/23   Shamleffer, Konrad Dolores, MD  ondansetron (ZOFRAN-ODT) 4 MG disintegrating tablet Take 1 tablet (4 mg total) by mouth every 8 (eight) hours as needed for up to 15 doses for nausea or vomiting. Patient not taking: Reported on 01/08/2024 01/05/24   Terald Sleeper, MD  pantoprazole (PROTONIX) 40 MG tablet Take 1 tablet (40 mg total) by mouth 2 (two) times daily before a meal. Patient not taking: Reported on 01/08/2024 11/30/22 01/08/24  Noralee Stain, DO  promethazine (PHENERGAN) 25 MG suppository Place 1 suppository (25 mg total) rectally every 6 (six) hours as needed for up to 6 doses for refractory nausea / vomiting. Patient not taking: Reported on 01/08/2024 01/05/24   Terald Sleeper, MD  rosuvastatin (CRESTOR) 10 MG tablet 1 tablet Orally Once a day for 90 days Patient not taking: Reported on 01/08/2024 11/11/23   [provider]    Current Facility-Administered  Medications  Medication Dose Route Frequency Provider Last Rate Last Admin   acetaminophen (TYLENOL) tablet 650 mg  650 mg Oral Q6H PRN Amin, Ankit C, MD       alum & mag hydroxide-simeth (MAALOX/MYLANTA) 200-200-20 MG/5ML suspension 15 mL  15 mL Oral Q4H PRN Benjamine Mola, Jessica U, DO   15 mL at 01/10/24 1636   amLODipine (NORVASC) tablet 5 mg  5 mg Oral Daily Amin, Ankit C, MD   5 mg at 01/12/24 1200   enoxaparin (LOVENOX) injection 70 mg  70 mg Subcutaneous Q24H Norva Pavlov, RPH   70 mg at 01/12/24 1202   gabapentin (NEURONTIN) capsule 300 mg  300 mg Oral BID Amin, Ankit C, MD   300 mg at 01/12/24 1200   guaiFENesin (ROBITUSSIN) 100 MG/5ML liquid 5 mL  5 mL Oral Q4H PRN Miguel Rota, MD  hydrALAZINE (APRESOLINE) injection 10 mg  10 mg Intravenous Q4H PRN Amin, Ankit C, MD       insulin aspart (novoLOG) injection 0-20 Units  0-20 Units Subcutaneous Q4H Marlin Canary U, DO   3 Units at 01/12/24 1201   insulin glargine-yfgn (SEMGLEE) injection 60 Units  60 Units Subcutaneous Daily Marlin Canary U, DO   60 Units at 01/12/24 1201   ipratropium-albuterol (DUONEB) 0.5-2.5 (3) MG/3ML nebulizer solution 3 mL  3 mL Nebulization Q4H PRN Amin, Ankit C, MD       metoprolol tartrate (LOPRESSOR) injection 5 mg  5 mg Intravenous Q4H PRN Amin, Ankit C, MD       nystatin (MYCOSTATIN) 100000 UNIT/ML suspension 500,000 Units  5 mL Oral QID Vann, Jessica U, DO   500,000 Units at 01/12/24 1410   pantoprazole (PROTONIX) EC tablet 40 mg  40 mg Oral BID Amin, Ankit C, MD   40 mg at 01/12/24 1200   prochlorperazine (COMPAZINE) injection 10 mg  10 mg Intravenous Q6H PRN Marlin Canary U, DO   10 mg at 01/12/24 0802   rosuvastatin (CRESTOR) tablet 10 mg  10 mg Oral Daily Amin, Ankit C, MD   10 mg at 01/12/24 1200   senna-docusate (Senokot-S) tablet 1 tablet  1 tablet Oral QHS PRN Amin, Ankit C, MD       traZODone (DESYREL) tablet 50 mg  50 mg Oral QHS PRN Amin, Ankit C, MD   50 mg at 01/10/24 2151    Allergies as  of 01/07/2024 - Review Complete 01/07/2024  Allergen Reaction Noted   Metformin and related Swelling 04/12/2023   Other Swelling 01/05/2024    GI Review of Symptoms Significant for osynophagia, nausea, vomiting, abdominal pain. Otherwise negative.  General Review of Systems  Review of systems is significant for the pertinent positives and negatives as listed per the HPI.  Full ROS is otherwise negative.    Physical Exam  Vital signs in last 24 hours: Temp:  [98.2 F (36.8 C)-98.4 F (36.9 C)] 98.2 F (36.8 C) (04/06 0501) Pulse Rate:  [70-71] 71 (04/06 0501) Resp:  [16] 16 (04/06 0501) BP: (107-129)/(66-73) 107/66 (04/06 0501) SpO2:  [94 %-96 %] 94 % (04/06 0501) Last BM Date : 01/05/24 General:  NAD, Well developed, Well nourished, alert and cooperative, resting in bed in no distress Head:  Normocephalic and atraumatic. Eyes:   PEERL, EOMI. No icterus. Conjunctiva pink. Throat: Oral cavity and pharynx without inflammation Lungs: Respirations even and unlabored. Lungs clear to auscultation bilaterally.   No wheezes, crackles, or rhonchi.  Heart: Normal S1, S2. No MRG. Regular rate and rhythm. No peripheral edema, cyanosis or pallor.  Abdomen:  Soft, nondistended, tender to palpation over the right upper quadrant and epigastrium no rebound or guarding. Normal bowel sounds. No appreciable masses or hepatomegaly. Rectal:  Deferred  Msk:  Symmetrical without gross deformities. Peripheral pulses intact.  Extremities:  Without edema, no deformity or joint abnormality. Normal ROM, normal sensation. Neurologic: Alert, grossly normal Skin:   Dry and intact without significant lesions or rashes. Psychiatric: Demonstrates good judgement and reason without abnormal affect or behaviors.   Lab Results Recent Labs    01/10/24 0407 01/11/24 0338 01/12/24 0327  WBC 9.8 11.3* 10.5  HGB 14.9 14.7 14.9  HCT 46.3 45.1 45.7  PLT 227 237 234   BMET Recent Labs    01/10/24 0407  01/11/24 0338 01/12/24 0327  NA 137 139 138  K 2.9* 3.0* 3.6  CL 102  104 102  CO2 24 24 28   GLUCOSE 278* 156* 194*  BUN 11 9 8   CREATININE 0.61 0.65 0.81  CALCIUM 8.6* 8.6* 8.5*   LFT Lab Results  Component Value Date   ALT 13 01/08/2024   AST 10 (L) 01/08/2024   ALKPHOS 83 01/08/2024   BILITOT 1.2 01/08/2024    PT/INR No results for input(s): "LABPROT", "INR" in the last 72 hours.  Radiographic Studies CTAP 01/07/2024 Diffuse wall thickening and inflammation of the distal esophagus compatible with esophagitis. Consider endoscopy for further evaluation.  CT Pelvis 04/12/2023 5.8 cm right inferomedial gluteal subcutaneous abscess; no evidence of fistula  Barium esophagram 11/28/2022 Small hiatal hernia. No esophageal mucosal irregularity, stricture or mass. No gastroesophageal reflux  Gastric emptying scan 11/27/2022 FINDINGS: Expected location of the stomach in the left upper quadrant. Ingested meal empties the stomach gradually over the course of the study.   58% emptied at 1 hr ( normal >= 10%)   60% emptied at 2 hr ( normal >= 40%)   68% emptied at 3 hr ( normal >= 70%)   87% emptied at 4 hr ( normal >= 90%)   IMPRESSION: Slightly reduced gastric emptying suspicious for gastroparesis.  CTAP 11/22/2022 No acute abnormality seen in the abdomen or pelvis.    Endoscopic Studies     EGD 01/08/2023  Path: Reactive changes, negative for H. Pylori   Clinical Impression   It is my clinical impression that Mr. 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 Mcfetridge was admitted to the hospital with persistent, nausea, vomiting and abdominal pain in the setting of a previous history of delayed gastric emptying and possible cannabinoid hyperemesis.  He has been followed in our office and underwent extensive evaluation for similar symptoms in 2024 with CTAP, barium esophagram, gastric emptying scan, EGD and laboratory studies.  His current constellation of symptoms seems to be consistent with an exacerbation of gastroparesis potentially in conjunction with a component of cannabinoid hyperemesis.  His current CT findings of esophageal wall thickening are likely esophagitis as a sequelae of recurrent vomiting.    He has had a very thorough evaluation to date.  In terms of additional workup I have suggested checking a morning cortisol to rule out adrenal insufficiency as a complicating factor.  He is not manifesting any focal CNS deficits to suggest need for CNS imaging tripping to his symptoms.  I think an EGD at this time has low yield from a diagnostic perspective.  It is also noted in his history that he has had a perirectal abscess in the past but imaging has not disclosed evidence of Crohn's disease as an explanation for his current symptomatology.   Plan  Check a morning cortisol level Continue Reglan 10 mg either p.o. or IV (depending upon tolerance) every 6 hours Continue Protonix 40 mg p.o. daily Consider the addition of scopolamine patch 1.5 mg transdermally every 72 hours Consider initiation of Carafate slurry 1 g p.o. 4 times daily for symptoms of chest discomfort and dysphagia If symptoms are not improving over next 24 to 48 hours can reevaluate need for EGD -rule out complicating infectious esophagitis-Candida,  HSV, etc Maintain adequate bowel regimen as constipation may worsen upper GI symptoms -can use MiraLAX, senna or Dulcolax as needed or scheduled if bowel movements are infrequent Avoidance of cannabis use in the  long-term  Dr. Leone Payor will assume rounding responsibilities 01/13/2024  Thank you for your kind consultation, we will continue to follow.  Durene Romans Rickita Forstner  01/12/2024, 2:39 PM  Maren Beach, MD [facility]

## 2024-01-12 NOTE — Progress Notes (Signed)
 PROGRESS NOTE    Shawn Benson  JYN:[id number] DOB: 1981/04/23 DOA: 01/07/2024 PCP: Carin Hock, PA    Brief Narrative:   43 year old with history of DM2 with history of gastroparesis comes to the hospital with complaints of ongoing persistent nausea, vomiting for about 48 hours.  CT abdomen pelvis shows concerns of esophagitis and possible early DKA.  Had endoscopy in April 2024 showing normal esophagus and stomach.  Patient admitted to the hospital with concerns of gastroparesis.    Assessment & Plan:  Principal Problem:   Nausea & vomiting Active Problems:   Type 2 diabetes mellitus without complication, with long-term current use of insulin (HCC)   Asthma   Morbid obesity due to excess calories (HCC)   Diabetic gastroparesis (HCC)   Elevated blood pressure reading   Intractable nausea vomiting - Suspect secondary to diabetic gastroparesis and cannabis hyperemesis syndrome.  Patient does have prior history of positive gastric emptying study in February 2024.  CT scan today shows esophagitis otherwise unremarkable.  Will continue patient on antiemetics/Reglan and Protonix -GI consult appreciated -added:  -check AM cortisol  ? Thrush -nystatin swish and swallow -IV diflucan x 1 again   Elevated anion gap - Initially thought to be possible early DKA versus dehydration.   -With IV fluids this has resolved.  Hypokalemia -repleted  Leukocytosis Trending down  Elevated blood pressure - Start Norvasc.  IV as needed. May swtich to ACE I or ARB upon dc or in the near future.   Uncontrolled diabetes mellitus type 2, insulin-dependent - Previous A1c 11.2.  Will recheck this.  Currently on sliding scale and Accu-Cheks -needs better control  Marijuana use -Counseled to quit using this    DVT prophylaxis: Lovenox    Code Status: Full Code      Subjective:  Still with nausea this AM  Examination:    General: Appearance:    Obese male in no acute distress      Lungs:      respirations unlabored  Heart:    Normal heart rate..   MS:   All extremities are intact.   Neurologic:   Awake            Objective: Vitals:   01/11/24 0447 01/11/24 1329 01/11/24 2253 01/12/24 0501  BP: 131/67 122/70 129/73 107/66  Pulse: 78 77 70 71  Resp: 20 18 16 16   Temp: 98.7 F (37.1 C) 97.9 F (36.6 C) 98.4 F (36.9 C) 98.2 F (36.8 C)  TempSrc: Oral Oral Oral Oral  SpO2: 97% 98% 96% 94%  Weight:      Height:        Intake/Output Summary (Last 24 hours) at 01/12/2024 1232 Last data filed at 01/11/2024 1501 Gross per 24 hour  Intake 100 ml  Output --  Net 100 ml   Filed Weights   01/08/24 0057  Weight: (!) 143 kg    Scheduled Meds:  amLODipine  5 mg Oral Daily   enoxaparin (LOVENOX) injection  70 mg Subcutaneous Q24H   gabapentin  300 mg Oral BID   insulin aspart  0-20 Units Subcutaneous Q4H   insulin glargine-yfgn  60 Units Subcutaneous Daily   nystatin  5 mL Oral QID   pantoprazole  40 mg Oral BID   rosuvastatin  10 mg Oral Daily   Continuous Infusions:     Nutritional status     Body mass index is 35.52 kg/m.  Data Reviewed:   CBC: Recent Labs  Lab 01/08/24 0419 01/09/24  1610 01/10/24 0407 01/11/24 0338 01/12/24 0327  WBC 17.3* 11.7* 9.8 11.3* 10.5  HGB 14.6 16.2 14.9 14.7 14.9  HCT 44.9 50.2 46.3 45.1 45.7  MCV 78.9* 80.2 79.6* 79.0* 80.2  PLT 256 258 227 237 234   Basic Metabolic Panel: Recent Labs  Lab 01/08/24 0419 01/09/24 0417 01/10/24 0407 01/11/24 0338 01/12/24 0327  NA 137 132* 137 139 138  K 3.4* 3.9 2.9* 3.0* 3.6  CL 104 99 102 104 102  CO2 23 25 24 24 28   GLUCOSE 231* 359* 278* 156* 194*  BUN 21* 17 11 9 8   CREATININE 0.70 0.95 0.61 0.65 0.81  CALCIUM 8.9 8.5* 8.6* 8.6* 8.5*  MG  --  1.8 2.0 1.8 2.1  PHOS  --  2.6  --   --   --    GFR: Estimated Creatinine Clearance: 186.7 mL/min (by C-G formula based on SCr of 0.81 mg/dL). Liver Function Tests: Recent Labs  Lab 01/05/24 1815  01/08/24 0419  AST 10* 10*  ALT 12 13  ALKPHOS 100 83  BILITOT 0.8 1.2  PROT 7.5 6.7  ALBUMIN 3.9 3.4*   Recent Labs  Lab 01/05/24 1815  LIPASE 23   No results for input(s): "AMMONIA" in the last 168 hours. Coagulation Profile: No results for input(s): "INR", "PROTIME" in the last 168 hours. Cardiac Enzymes: No results for input(s): "CKTOTAL", "CKMB", "CKMBINDEX", "TROPONINI" in the last 168 hours. BNP (last 3 results) No results for input(s): "PROBNP" in the last 8760 hours. HbA1C: No results for input(s): "HGBA1C" in the last 72 hours.  CBG: Recent Labs  Lab 01/11/24 0743 01/11/24 1149 01/11/24 1624 01/12/24 0750 01/12/24 1137  GLUCAP 204* 287* 219* 225* 147*   Lipid Profile: No results for input(s): "CHOL", "HDL", "LDLCALC", "TRIG", "CHOLHDL", "LDLDIRECT" in the last 72 hours. Thyroid Function Tests: No results for input(s): "TSH", "T4TOTAL", "FREET4", "T3FREE", "THYROIDAB" in the last 72 hours. Anemia Panel: No results for input(s): "VITAMINB12", "FOLATE", "FERRITIN", "TIBC", "IRON", "RETICCTPCT" in the last 72 hours. Sepsis Labs: No results for input(s): "PROCALCITON", "LATICACIDVEN" in the last 168 hours.  No results found for this or any previous visit (from the past 240 hours).       Radiology Studies: No results found.          LOS: 3 days   Time spent= 35 mins    Joseph Art, DO [facility]  If 7PM-7AM, please contact night-coverage  01/12/2024, 12:32 PM

## 2024-01-13 DIAGNOSIS — E1310 Other specified diabetes mellitus with ketoacidosis without coma: Secondary | ICD-10-CM | POA: Diagnosis not present

## 2024-01-13 DIAGNOSIS — K209 Esophagitis, unspecified without bleeding: Secondary | ICD-10-CM | POA: Diagnosis not present

## 2024-01-13 LAB — BASIC METABOLIC PANEL WITH GFR
Anion gap: 8 (ref 5–15)
BUN: 9 mg/dL (ref 6–20)
CO2: 25 mmol/L (ref 22–32)
Calcium: 8.9 mg/dL (ref 8.9–10.3)
Chloride: 107 mmol/L (ref 98–111)
Creatinine, Ser: 0.68 mg/dL (ref 0.61–1.24)
GFR, Estimated: >60 mL/min (ref >60)
Glucose, Bld: 117 mg/dL — ABNORMAL HIGH (ref 70–99)
Potassium: 3.2 mmol/L — ABNORMAL LOW (ref 3.5–5.1)
Sodium: 140 mmol/L (ref 135–145)

## 2024-01-13 LAB — CBC
HCT: 43.2 % (ref 39.0–52.0)
Hemoglobin: 13.6 g/dL (ref 13.0–17.0)
MCH: 25.9 pg — ABNORMAL LOW (ref 26.0–34.0)
MCHC: 31.5 g/dL (ref 30.0–36.0)
MCV: 82.3 fL (ref 80.0–100.0)
Platelets: 251 10*3/uL (ref 150–400)
RBC: 5.25 MIL/uL (ref 4.22–5.81)
RDW: 14.7 % (ref 11.5–15.5)
WBC: 13.5 10*3/uL — ABNORMAL HIGH (ref 4.0–10.5)
nRBC: 0 % (ref 0.0–0.2)

## 2024-01-13 LAB — GLUCOSE, CAPILLARY
Glucose-Capillary: 100 mg/dL — ABNORMAL HIGH (ref 70–99)
Glucose-Capillary: 114 mg/dL — ABNORMAL HIGH (ref 70–99)
Glucose-Capillary: 187 mg/dL — ABNORMAL HIGH (ref 70–99)
Glucose-Capillary: 196 mg/dL — ABNORMAL HIGH (ref 70–99)
Glucose-Capillary: 203 mg/dL — ABNORMAL HIGH (ref 70–99)
Glucose-Capillary: 217 mg/dL — ABNORMAL HIGH (ref 70–99)
Glucose-Capillary: 274 mg/dL — ABNORMAL HIGH (ref 70–99)

## 2024-01-13 LAB — CORTISOL: Cortisol, Plasma: 1.7 ug/dL

## 2024-01-13 MED ORDER — COSYNTROPIN 0.25 MG IJ SOLR
0.2500 mg | Freq: Once | INTRAMUSCULAR | Status: AC
  Administered 2024-01-14: 0.25 mg via INTRAVENOUS
  Filled 2024-01-13: qty 0.25

## 2024-01-13 MED ORDER — METOCLOPRAMIDE HCL 5 MG/ML IJ SOLN
10.0000 mg | Freq: Four times a day (QID) | INTRAMUSCULAR | Status: DC
  Administered 2024-01-13 – 2024-01-14 (×5): 10 mg via INTRAVENOUS
  Filled 2024-01-13 (×5): qty 2

## 2024-01-13 MED ORDER — ORAL CARE MOUTH RINSE: 15.0000 mL | OROMUCOSAL | Status: DC | PRN

## 2024-01-13 MED ORDER — SENNOSIDES-DOCUSATE SODIUM 8.6-50 MG PO TABS
2.0000 | ORAL_TABLET | Freq: Once | ORAL | Status: AC
  Administered 2024-01-13: 2 via ORAL
  Filled 2024-01-13: qty 2

## 2024-01-13 NOTE — Plan of Care (Signed)

## 2024-01-13 NOTE — Progress Notes (Signed)
 PROGRESS NOTE    Shawn Benson  ZOX:[id number] DOB: 1980-10-16 DOA: 01/07/2024 PCP: Carin Hock, PA    Brief Narrative:   43 year old with history of DM2 with history of gastroparesis comes to the hospital with complaints of ongoing persistent nausea, vomiting for about 48 hours.  CT abdomen pelvis shows concerns of esophagitis and possible early DKA.  Had endoscopy in April 2024 showing normal esophagus and stomach.  Patient admitted to the hospital with concerns of gastroparesis. Slow to improve with continued N/V.  GI consulted and cortisol checked.  AM cortisol low so work up in process.    Assessment & Plan:  Principal Problem:   Nausea & vomiting- gastroparesis Active Problems:   Type 2 diabetes mellitus without complication, with long-term current use of insulin (HCC)   Asthma   Morbid obesity due to excess calories (HCC)   Diabetic gastroparesis (HCC)   Elevated blood pressure reading   Intractable nausea vomiting - Suspect secondary to diabetic gastroparesis and cannabis hyperemesis syndrome.  Patient does have prior history of positive gastric emptying study in February 2024.  CT scan today shows esophagitis otherwise unremarkable.  Will continue patient on antiemetics/Reglan and Protonix -GI consult appreciated -AM cortisol low: check ACTH and attempt a cosyntropin stim test in the AM  ? Thrush -nystatin swish and swallow -s/p dflucan x 2 doses   Elevated anion gap - Initially thought to be possible early DKA versus dehydration.   -With IV fluids this has resolved.  Hypokalemia -repleted   Elevated blood pressure - Start Norvasc.  IV as needed. May swtich to ACE I or ARB upon dc or in the near future.   Uncontrolled diabetes mellitus type 2, insulin-dependent - Previous A1c 11.2 -SSI -needs better control  Marijuana use -Counseled to quit using this  Obesity Estimated body mass index is 35.52 kg/m as calculated from the following:   Height as of  this encounter: 6\' 7"  (2.007 m).   Weight as of this encounter: 143 kg.   DVT prophylaxis: Lovenox    Code Status: Full Code      Subjective: Tolerating liquids this AM-- no further N/V  Examination:    General: Appearance:    Obese male in no acute distress     Lungs:      respirations unlabored  Heart:    Normal heart rate..   MS:   All extremities are intact.   Neurologic:   Awake            Objective: Vitals:   01/12/24 0501 01/12/24 1500 01/12/24 2000 01/13/24 0400  BP: 107/66 110/74 117/78 121/68  Pulse: 71 93 93 63  Resp: 16 17 18 20   Temp: 98.2 F (36.8 C) 98.5 F (36.9 C) 98.6 F (37 C) 97.8 F (36.6 C)  TempSrc: Oral Oral Oral Oral  SpO2: 94% 100% 100% 100%  Weight:      Height:       No intake or output data in the 24 hours ending 01/13/24 1227  Filed Weights   01/08/24 0057  Weight: (!) 143 kg    Scheduled Meds:  amLODipine  5 mg Oral Daily   [START ON 01/14/2024] cosyntropin  0.25 mg Intravenous Once   enoxaparin (LOVENOX) injection  70 mg Subcutaneous Q24H   gabapentin  300 mg Oral BID   insulin aspart  0-20 Units Subcutaneous Q4H   insulin glargine-yfgn  60 Units Subcutaneous Daily   metoCLOPramide (REGLAN) injection  10 mg Intravenous Q6H   nystatin  5 mL Oral QID   pantoprazole  40 mg Oral BID   rosuvastatin  10 mg Oral Daily   senna-docusate  2 tablet Oral Once   Continuous Infusions:     Nutritional status     Body mass index is 35.52 kg/m.  Data Reviewed:   CBC: Recent Labs  Lab 01/09/24 0417 01/10/24 0407 01/11/24 0338 01/12/24 0327 01/13/24 0326  WBC 11.7* 9.8 11.3* 10.5 13.5*  HGB 16.2 14.9 14.7 14.9 13.6  HCT 50.2 46.3 45.1 45.7 43.2  MCV 80.2 79.6* 79.0* 80.2 82.3  PLT 258 227 237 234 251   Basic Metabolic Panel: Recent Labs  Lab 01/09/24 0417 01/10/24 0407 01/11/24 0338 01/12/24 0327 01/13/24 0326  NA 132* 137 139 138 140  K 3.9 2.9* 3.0* 3.6 3.2*  CL 99 102 104 102 107  CO2 25 24 24 28 25    GLUCOSE 359* 278* 156* 194* 117*  BUN 17 11 9 8 9   CREATININE 0.95 0.61 0.65 0.81 0.68  CALCIUM 8.5* 8.6* 8.6* 8.5* 8.9  MG 1.8 2.0 1.8 2.1  --   PHOS 2.6  --   --   --   --    GFR: Estimated Creatinine Clearance: 189 mL/min (by C-G formula based on SCr of 0.68 mg/dL). Liver Function Tests: Recent Labs  Lab 01/08/24 0419  AST 10*  ALT 13  ALKPHOS 83  BILITOT 1.2  PROT 6.7  ALBUMIN 3.4*   No results for input(s): "LIPASE", "AMYLASE" in the last 168 hours.  No results for input(s): "AMMONIA" in the last 168 hours. Coagulation Profile: No results for input(s): "INR", "PROTIME" in the last 168 hours. Cardiac Enzymes: No results for input(s): "CKTOTAL", "CKMB", "CKMBINDEX", "TROPONINI" in the last 168 hours. BNP (last 3 results) No results for input(s): "PROBNP" in the last 8760 hours. HbA1C: No results for input(s): "HGBA1C" in the last 72 hours.  CBG: Recent Labs  Lab 01/12/24 2007 01/13/24 0017 01/13/24 0404 01/13/24 0838 01/13/24 1154  GLUCAP 278* 187* 100* 114* 274*   Lipid Profile: No results for input(s): "CHOL", "HDL", "LDLCALC", "TRIG", "CHOLHDL", "LDLDIRECT" in the last 72 hours. Thyroid Function Tests: No results for input(s): "TSH", "T4TOTAL", "FREET4", "T3FREE", "THYROIDAB" in the last 72 hours. Anemia Panel: No results for input(s): "VITAMINB12", "FOLATE", "FERRITIN", "TIBC", "IRON", "RETICCTPCT" in the last 72 hours. Sepsis Labs: No results for input(s): "PROCALCITON", "LATICACIDVEN" in the last 168 hours.  No results found for this or any previous visit (from the past 240 hours).       Radiology Studies: No results found.          LOS: 4 days   Time spent= 35 mins    Joseph Art, DO [facility]  If 7PM-7AM, please contact night-coverage  01/13/2024, 12:27 PM

## 2024-01-13 NOTE — Progress Notes (Addendum)
 [facility] Progress Note  CC:  Nausea, vomiting, gastroparesis/delayed gastric emptying   Subjective:  Feels ok.  No vomiting since yesterday AM.  Is going to eat some solid food before taking his AM meds because he feels like he just has taken too many liquids with no solid food and does not want the meds without food.  No BM in several days.  Objective:  Vital signs in last 24 hours: Temp:  [97.8 F (36.6 C)-98.6 F (37 C)] 97.8 F (36.6 C) (04/07 0400) Pulse Rate:  [63-93] 63 (04/07 0400) Resp:  [17-20] 20 (04/07 0400) BP: (110-121)/(68-78) 121/68 (04/07 0400) SpO2:  [100 %] 100 % (04/07 0400) Last BM Date : 01/12/24 General:  Alert, Well-developed, in NAD Heart:  Regular rate and rhythm; no murmurs Pulm:  CTAB.  No W/R/R. Abdomen:  Soft, non-distended.  BS present.  Non-tender. Neurologic:  Alert and oriented x 4;  grossly normal neurologically. Psych:  Alert and cooperative. Normal mood and affect.  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  His current constellation of symptoms seems to be consistent with an exacerbation of gastroparesis potentially in conjunction with a component of cannabinoid hyperemesis.  His current CT findings of esophageal wall thickening are likely esophagitis as a sequelae of recurrent vomiting.    Seems to be improving.  Last vomiting was yesterday AM.  Is going to try some solid food.  -AM cortisol level is low so ACTH stimulation test pending. -Was on Reglan but it looks like this was discontinued.  He takes it once daily at home on a regular basis.  I discussed with Dr. Benjamine Mola and she restarted it at 10 mg IV every 6 hours for now. -Continue Protonix 40 mg p.o. twice daily -Consider the addition of scopolamine patch 1.5 mg transdermally every 72 hours if needed. -Consider initiation of Carafate slurry 1 g p.o. 4 times daily for symptoms of chest discomfort and dysphagia if needed. -Maintain adequate bowel regimen as constipation may worsen upper GI symptoms -can use MiraLAX, senna, or Dulcolax as needed or scheduled if bowel movements are infrequent.  No BM in several days and has not been receiving his prn senokot-S.  I ordered for 2 tablets to be given as a one time dose now. -Avoidance of cannabis use in the long-term. -Small frequent meals would be best and I discussed that with him.     LOS: 4 days   Princella Pellegrini.  Zehr  01/13/2024, 9:19 AM     [facility] Attending   I have taken an interval history, reviewed the chart and examined the patient. I agree with the Advanced Practitioner's note, impression and recommendations with the following additions:  He is improved and eating solid food right now - multiple causes of nausea and vomiting: DM autonomic neuropathy, cannibis and now adrenal insufficiency may be in play.  Continue care as above - does not seem like he needs carafate or scoplamine patch currently.  Iva Boop,  MD, [facility] See Loretha Stapler on call - gastroenterology for best contact person 01/13/2024 4:32 PM

## 2024-01-14 ENCOUNTER — Other Ambulatory Visit (HOSPITAL_COMMUNITY): Payer: Self-pay

## 2024-01-14 DIAGNOSIS — E1165 Type 2 diabetes mellitus with hyperglycemia: Secondary | ICD-10-CM | POA: Diagnosis not present

## 2024-01-14 DIAGNOSIS — K209 Esophagitis, unspecified without bleeding: Secondary | ICD-10-CM | POA: Diagnosis not present

## 2024-01-14 DIAGNOSIS — Z794 Long term (current) use of insulin: Secondary | ICD-10-CM | POA: Diagnosis not present

## 2024-01-14 LAB — BASIC METABOLIC PANEL WITH GFR
Anion gap: 11 (ref 5–15)
BUN: 10 mg/dL (ref 6–20)
CO2: 25 mmol/L (ref 22–32)
Calcium: 9 mg/dL (ref 8.9–10.3)
Chloride: 104 mmol/L (ref 98–111)
Creatinine, Ser: 0.85 mg/dL (ref 0.61–1.24)
GFR, Estimated: >60 mL/min (ref >60)
Glucose, Bld: 131 mg/dL — ABNORMAL HIGH (ref 70–99)
Potassium: 3.3 mmol/L — ABNORMAL LOW (ref 3.5–5.1)
Sodium: 140 mmol/L (ref 135–145)

## 2024-01-14 LAB — ACTH STIMULATION, 3 TIME POINTS
Cortisol, 30 Min: 18.1 ug/dL
Cortisol, 60 Min: 20.4 ug/dL
Cortisol, Base: 4.8 ug/dL

## 2024-01-14 LAB — CBC
HCT: 45.8 % (ref 39.0–52.0)
Hemoglobin: 14.4 g/dL (ref 13.0–17.0)
MCH: 25.6 pg — ABNORMAL LOW (ref 26.0–34.0)
MCHC: 31.4 g/dL (ref 30.0–36.0)
MCV: 81.3 fL (ref 80.0–100.0)
Platelets: 284 10*3/uL (ref 150–400)
RBC: 5.63 MIL/uL (ref 4.22–5.81)
RDW: 14.9 % (ref 11.5–15.5)
WBC: 10.2 10*3/uL (ref 4.0–10.5)
nRBC: 0 % (ref 0.0–0.2)

## 2024-01-14 LAB — GLUCOSE, CAPILLARY
Glucose-Capillary: 193 mg/dL — ABNORMAL HIGH (ref 70–99)
Glucose-Capillary: 116 mg/dL — ABNORMAL HIGH (ref 70–99)
Glucose-Capillary: 417 mg/dL — ABNORMAL HIGH (ref 70–99)
Glucose-Capillary: 333 mg/dL — ABNORMAL HIGH (ref 70–99)
Glucose-Capillary: 318 mg/dL — ABNORMAL HIGH (ref 70–99)

## 2024-01-14 LAB — ACTH: C206 ACTH: 43 pg/mL (ref 7.2–63.3)

## 2024-01-14 MED ORDER — METOCLOPRAMIDE HCL 10 MG PO TABS
10.0000 mg | ORAL_TABLET | Freq: Two times a day (BID) | ORAL | 2 refills | Status: AC
  Filled 2024-01-14: qty 60, 30d supply, fill #0

## 2024-01-14 MED ORDER — NYSTATIN 100000 UNIT/ML MT SUSP
5.0000 mL | Freq: Four times a day (QID) | OROMUCOSAL | 0 refills | Status: AC
  Filled 2024-01-14: qty 140, 7d supply, fill #0

## 2024-01-14 MED ORDER — POTASSIUM CHLORIDE CRYS ER 20 MEQ PO TBCR
40.0000 meq | EXTENDED_RELEASE_TABLET | Freq: Once | ORAL | Status: AC
  Administered 2024-01-14: 40 meq via ORAL
  Filled 2024-01-14: qty 2

## 2024-01-14 MED ORDER — INSULIN ASPART 100 UNIT/ML IJ SOLN
4.0000 [IU] | Freq: Three times a day (TID) | INTRAMUSCULAR | Status: DC
  Administered 2024-01-14: 4 [IU] via SUBCUTANEOUS

## 2024-01-14 MED ORDER — PANTOPRAZOLE SODIUM 40 MG PO TBEC
40.0000 mg | DELAYED_RELEASE_TABLET | Freq: Two times a day (BID) | ORAL | 2 refills | Status: AC
  Filled 2024-01-14: qty 60, 30d supply, fill #0

## 2024-01-14 NOTE — Discharge Summary (Signed)
 Physician Discharge Summary  Shawn P Palazzola ZOX:[id number] DOB: [birth year] DOA: 01/07/2024  PCP: Carin Hock, PA  Admit date: 01/07/2024 Discharge date: 01/14/2024  Admitted From: home Discharge disposition: home   Recommendations for Outpatient Follow-Up:   Marijuana cessation Cbc.bmp  1 week Needs better blood sugar control Reglan added back and increased PPI added Monitor for thrush-- be sure there is resolution   Discharge Diagnosis:   Principal Problem:   Nausea & vomiting Active Problems:   Type 2 diabetes mellitus without complication, with long-term current use of insulin (HCC)   Asthma   Morbid obesity due to excess calories (HCC)   Diabetic gastroparesis (HCC)   Elevated blood pressure reading   Gastroparesis   Delayed gastric emptying   Esophagitis   Constipation    Discharge Condition: Improved.  Diet recommendation: Carbohydrate-modified.  Wound care: None.  Code status: Full.   History of Present Illness:   Shawn Benson is a 43 y.o. male with history of diabetes mellitus type 2 presents to the ER with complaints of persistent nausea vomiting ongoing for last 48 hours.  Patient has been having intermittent nausea vomiting last 2 weeks which has worsened acutely last 48 hours.  Has not been able to eat much has lost weight.  Also complains of abdominal discomfort.  Had initially some diarrhea which has resolved.  Denies taking any antibiotics recently.  Patient states he has not taken his Toujeo over the last 2 weeks because he ran out of medications.   In February 2024 patient was admitted for nausea vomiting at that time patient's gastric emptying study showed features concerning for gastroparesis.  EGD done on 4/24 showed normal esophagus and stomach.  Patient admits to taking marijuana last almost 2 days ago.     Hospital Course by Problem:   Intractable nausea vomiting - Suspect secondary to diabetic gastroparesis and cannabis  hyperemesis syndrome.  Patient does have prior history of positive gastric emptying study in February 2024.  CT scan today shows esophagitis otherwise unremarkable.  Will continue patient on antiemetics/Reglan and Protonix -GI consult appreciated -AM cortisol low: check ACTH and attempt a cosyntropin stim test --still pending - Patient has tolerated advance diet   Thrush -nystatin swish and swallow x 2 weeks -s/p dflucan x 2 doses with improvement     Elevated anion gap - Initially thought to be possible early DKA versus dehydration.   -With IV fluids this has resolved.   Hypokalemia -repleted     Elevated blood pressure - Appears resolved so we will hold Norvasc today   Uncontrolled diabetes mellitus type 2, insulin-dependent - Previous A1c 11.2 -SSI, long-acting and meal coverage -needs better control   Marijuana use -Counseled to quit using this   Obesity Estimated body mass index is 35.52 kg/m as calculated from the following:   Height as of this encounter: 6\' 7"  (2.007 m).   Weight as of this encounter: 143 kg.     Medical Consultants:   GI   Discharge Exam:   Vitals:   01/14/24 0340 01/14/24 1329  BP: 115/80 127/80  Pulse: 93 82  Resp: 18 20  Temp: 98.1 F (36.7 C) 98.2 F (36.8 C)  SpO2: 98% 98%   Vitals:   01/13/24 1529 01/13/24 1953 01/14/24 0340 01/14/24 1329  BP: (!) 91/57 130/79 115/80 127/80  Pulse: 61 81 93 82  Resp: 18 18 18 20   Temp: 98 F (36.7 C) 98.9 F (37.2 C) 98.1 F (36.7  C) 98.2 F (36.8 C)  TempSrc:  Oral Oral Oral  SpO2: 98% 100% 98% 98%  Weight:      Height:           The results of significant diagnostics from this hospitalization (including imaging, microbiology, ancillary and laboratory) are listed below for reference.     Procedures and Diagnostic Studies:   CT ABDOMEN PELVIS W CONTRAST Result Date: 01/07/2024 CLINICAL DATA:  Acute abdominal pain. EXAM: CT ABDOMEN AND PELVIS WITH CONTRAST TECHNIQUE:  Multidetector CT imaging of the abdomen and pelvis was performed using the standard protocol following bolus administration of intravenous contrast. RADIATION DOSE REDUCTION: This exam was performed according to the departmental dose-optimization program which includes automated exposure control, adjustment of the mA and/or kV according to patient size and/or use of iterative reconstruction technique. CONTRAST:  OMNIPAQUE IOHEXOL 300 MG/ML  SOLN COMPARISON:  CT abdomen and pelvis 11/22/2022. FINDINGS: Lower chest: No acute abnormality. Hepatobiliary: No focal liver abnormality is seen. No gallstones, gallbladder wall thickening, or biliary dilatation. Pancreas: Unremarkable. No pancreatic ductal dilatation or surrounding inflammatory changes. Spleen: Normal in size without focal abnormality. Adrenals/Urinary Tract: Adrenal glands are unremarkable. Kidneys are normal, without renal calculi, focal lesion, or hydronephrosis. Bladder is unremarkable. Stomach/Bowel: Stomach is within normal limits. Appendix appears normal. No evidence of bowel wall thickening, distention, or inflammatory changes. There is diffuse wall thickening and inflammation of the distal esophagus. Vascular/Lymphatic: No significant vascular findings are present. No enlarged abdominal or pelvic lymph nodes. Reproductive: Prostate is unremarkable. Other: No abdominal wall hernia or abnormality. No abdominopelvic ascites. Musculoskeletal: No fracture is seen. IMPRESSION: Diffuse wall thickening and inflammation of the distal esophagus compatible with esophagitis. Consider endoscopy for further evaluation. Electronically Signed   By: Darliss Cheney M.D.   On: 01/07/2024 18:22   DG Chest 2 View Result Date: 01/07/2024 CLINICAL DATA:  Chest pain. EXAM: CHEST - 2 VIEW COMPARISON:  11/26/2022. FINDINGS: Bilateral lung fields are clear. Bilateral costophrenic angles are clear. Normal cardio-mediastinal silhouette. No acute osseous abnormalities. The  soft tissues are within normal limits. IMPRESSION: No active cardiopulmonary disease. Electronically Signed   By: Jules Schick M.D.   On: 01/07/2024 17:10     Labs:   Basic Metabolic Panel: Recent Labs  Lab 01/09/24 0417 01/10/24 0407 01/11/24 0338 01/12/24 0327 01/13/24 0326 01/14/24 0652  NA 132* 137 139 138 140 140  K 3.9 2.9* 3.0* 3.6 3.2* 3.3*  CL 99 102 104 102 107 104  CO2 25 24 24 28 25 25   GLUCOSE 359* 278* 156* 194* 117* 131*  BUN 17 11 9 8 9 10   CREATININE 0.95 0.61 0.65 0.81 0.68 0.85  CALCIUM 8.5* 8.6* 8.6* 8.5* 8.9 9.0  MG 1.8 2.0 1.8 2.1  --   --   PHOS 2.6  --   --   --   --   --    GFR Estimated Creatinine Clearance: 177.9 mL/min (by C-G formula based on SCr of 0.85 mg/dL). Liver Function Tests: Recent Labs  Lab 01/08/24 0419  AST 10*  ALT 13  ALKPHOS 83  BILITOT 1.2  PROT 6.7  ALBUMIN 3.4*   No results for input(s): "LIPASE", "AMYLASE" in the last 168 hours. No results for input(s): "AMMONIA" in the last 168 hours. Coagulation profile No results for input(s): "INR", "PROTIME" in the last 168 hours.  CBC: Recent Labs  Lab 01/10/24 0407 01/11/24 0338 01/12/24 0327 01/13/24 0326 01/14/24 0855  WBC 9.8 11.3* 10.5 13.5* 10.2  HGB 14.9 14.7 14.9 13.6 14.4  HCT 46.3 45.1 45.7 43.2 45.8  MCV 79.6* 79.0* 80.2 82.3 81.3  PLT 227 237 234 251 284   Cardiac Enzymes: No results for input(s): "CKTOTAL", "CKMB", "CKMBINDEX", "TROPONINI" in the last 168 hours. BNP: Invalid input(s): "POCBNP" CBG: Recent Labs  Lab 01/13/24 2312 01/14/24 0339 01/14/24 0736 01/14/24 1205 01/14/24 1232  GLUCAP 217* 193* 116* 417* 333*   D-Dimer No results for input(s): "DDIMER" in the last 72 hours. Hgb A1c No results for input(s): "HGBA1C" in the last 72 hours. Lipid Profile No results for input(s): "CHOL", "HDL", "LDLCALC", "TRIG", "CHOLHDL", "LDLDIRECT" in the last 72 hours. Thyroid function studies No results for input(s): "TSH", "T4TOTAL", "T3FREE",  "THYROIDAB" in the last 72 hours.  Invalid input(s): "FREET3" Anemia work up No results for input(s): "VITAMINB12", "FOLATE", "FERRITIN", "TIBC", "IRON", "RETICCTPCT" in the last 72 hours. Microbiology No results found for this or any previous visit (from the past 240 hours).   Discharge Instructions:   Discharge Instructions     Diet Carb Modified   Complete by: As directed    Discharge instructions   Complete by: As directed    Avoid all things marijuana Bring log of blood sugars to PCP   Increase activity slowly   Complete by: As directed       Allergies as of 01/14/2024       Reactions   Metformin And Related Swelling   Other Swelling   Rybelsus (semaglutide)   Semaglutide Swelling        Medication List     TAKE these medications    B-D ULTRAFINE III SHORT PEN 31G X 8 MM Misc Generic drug: Insulin Pen Needle 1 Device by Does not apply route in the morning, at noon, in the evening, and at bedtime.   Dexcom G7 Sensor Misc Change sensors every 10 days   ergocalciferol 1.25 MG (50000 UT) capsule Commonly known as: VITAMIN D2 1 capsule right after a meal by mouth once a week   gabapentin 300 MG capsule Commonly known as: NEURONTIN Take 300 mg by mouth 2 (two) times daily.   insulin lispro 100 UNIT/ML KwikPen Commonly known as: HumaLOG KwikPen Max daily 30 units What changed:  how much to take how to take this when to take this additional instructions   metoCLOPramide 10 MG tablet Commonly known as: REGLAN Take 1 tablet (10 mg total) by mouth 2 (two) times daily. What changed: when to take this   nystatin 100000 UNIT/ML suspension Commonly known as: MYCOSTATIN Take 5 mLs (500,000 Units total) by mouth 4 (four) times daily.   ondansetron 4 MG disintegrating tablet Commonly known as: ZOFRAN-ODT Take 1 tablet (4 mg total) by mouth every 8 (eight) hours as needed for up to 15 doses for nausea or vomiting.   pantoprazole 40 MG tablet Commonly  known as: PROTONIX Take 1 tablet (40 mg total) by mouth 2 (two) times daily before a meal.   promethazine 25 MG suppository Commonly known as: PHENERGAN Place 1 suppository (25 mg total) rectally every 6 (six) hours as needed for up to 6 doses for refractory nausea / vomiting.   rosuvastatin 10 MG tablet Commonly known as: CRESTOR 1 tablet Orally Once a day for 90 days   Toujeo Max SoloStar 300 UNIT/ML Solostar Pen Generic drug: insulin glargine (2 Unit Dial) Inject 70 Units into the skin daily in the afternoon.        Follow-up Information     Carin Hock,  PA Follow up in 1 week(s).   Specialty: Physician Assistant Contact information: [address] [place] [address] [phone]                  Time coordinating discharge: 45 min  Signed:  Joseph Art DO  [facility] 01/14/2024, 2:21 PM

## 2024-01-14 NOTE — Inpatient Diabetes Management (Signed)
 Inpatient Diabetes Program Recommendations  AACE/ADA: New Consensus Statement on Inpatient Glycemic Control (2015)  Target Ranges:  Prepandial:   less than 140 mg/dL      Peak postprandial:   less than 180 mg/dL (1-2 hours)      Critically ill patients:  140 - 180 mg/dL   Lab Results  Component Value Date   GLUCAP 116 (H) 01/14/2024   HGBA1C 13.2 (H) 01/08/2024    Review of Glycemic Control  Latest Reference Range & Units 01/13/24 08:38 01/13/24 11:54 01/13/24 16:38 01/13/24 19:53 01/13/24 23:12 01/14/24 03:39 01/14/24 07:36  Glucose-Capillary 70 - 99 mg/dL 132 (H) 440 (H) 102 (H) 203 (H) 217 (H) 193 (H) 116 (H)    Diabetes history: DM2 Outpatient Diabetes medications: Toujeo 70 units every day, Humalog 6-20 units TID Current orders for Inpatient glycemic control:  Novolog 0-20 units TID Semglee 60 units Daily  Inpatient Diabetes Program Recommendations:    -   Add Novolog 5 units tid meal coverage if eating >50% of meals  Thanks,  Christena Deem RN, MSN, BC-ADM Inpatient Diabetes Coordinator Team Pager [phone] (8a-5p)

## 2024-01-14 NOTE — Progress Notes (Signed)
 PROGRESS NOTE    Shawn Benson  ZOX:[id number] DOB: 1981/05/27 DOA: 01/07/2024 PCP: Carin Hock, PA    Brief Narrative:   43 year old with history of DM2 with history of gastroparesis comes to the hospital with complaints of ongoing persistent nausea, vomiting for about 48 hours.  CT abdomen pelvis shows concerns of esophagitis and possible early DKA.  Had endoscopy in April 2024 showing normal esophagus and stomach.  Patient admitted to the hospital with concerns of gastroparesis. Slow to improve with continued N/V.  GI consulted and cortisol checked.  AM cortisol low so work up in process-stim test is pending (of note it appears it was done correctly by lab but just later than 5 AM)    Assessment & Plan:  Principal Problem:   Nausea & vomiting- gastroparesis Active Problems:   Type 2 diabetes mellitus without complication, with long-term current use of insulin (HCC)   Asthma   Morbid obesity due to excess calories (HCC)   Diabetic gastroparesis (HCC)   Elevated blood pressure reading   Intractable nausea vomiting - Suspect secondary to diabetic gastroparesis and cannabis hyperemesis syndrome.  Patient does have prior history of positive gastric emptying study in February 2024.  CT scan today shows esophagitis otherwise unremarkable.  Will continue patient on antiemetics/Reglan and Protonix -GI consult appreciated -AM cortisol low: check ACTH and attempt a cosyntropin stim test --still pending - Patient has tolerated advance diet  Thrush -nystatin swish and swallow -s/p dflucan x 2 doses with improvement   Elevated anion gap - Initially thought to be possible early DKA versus dehydration.   -With IV fluids this has resolved.  Hypokalemia -repleted   Elevated blood pressure - Appears resolved so we will hold Norvasc today  Uncontrolled diabetes mellitus type 2, insulin-dependent - Previous A1c 11.2 -SSI, long-acting and meal coverage -needs better  control  Marijuana use -Counseled to quit using this  Obesity Estimated body mass index is 35.52 kg/m as calculated from the following:   Height as of this encounter: 6\' 7"  (2.007 m).   Weight as of this encounter: 143 kg.   DVT prophylaxis: Lovenox    Code Status: Full Code Family at bedside     Subjective: Tolerating his diet  Examination:    General: Appearance:    Obese male in no acute distress     Lungs:      respirations unlabored  Heart:    Normal heart rate..   MS:   All extremities are intact.   Neurologic:   Awake            Objective: Vitals:   01/13/24 0400 01/13/24 1529 01/13/24 1953 01/14/24 0340  BP: 121/68 (!) 91/57 130/79 115/80  Pulse: 63 61 81 93  Resp: 20 18 18 18   Temp: 97.8 F (36.6 C) 98 F (36.7 C) 98.9 F (37.2 C) 98.1 F (36.7 C)  TempSrc: Oral  Oral Oral  SpO2: 100% 98% 100% 98%  Weight:      Height:        Intake/Output Summary (Last 24 hours) at 01/14/2024 1111 Last data filed at 01/13/2024 1950 Gross per 24 hour  Intake 240 ml  Output --  Net 240 ml    Filed Weights   01/08/24 0057  Weight: (!) 143 kg    Scheduled Meds:  enoxaparin (LOVENOX) injection  70 mg Subcutaneous Q24H   gabapentin  300 mg Oral BID   insulin aspart  0-20 Units Subcutaneous Q4H   insulin glargine-yfgn  60  Units Subcutaneous Daily   metoCLOPramide (REGLAN) injection  10 mg Intravenous Q6H   nystatin  5 mL Oral QID   pantoprazole  40 mg Oral BID   rosuvastatin  10 mg Oral Daily   Continuous Infusions:     Nutritional status     Body mass index is 35.52 kg/m.  Data Reviewed:   CBC: Recent Labs  Lab 01/10/24 0407 01/11/24 0338 01/12/24 0327 01/13/24 0326 01/14/24 0855  WBC 9.8 11.3* 10.5 13.5* 10.2  HGB 14.9 14.7 14.9 13.6 14.4  HCT 46.3 45.1 45.7 43.2 45.8  MCV 79.6* 79.0* 80.2 82.3 81.3  PLT 227 237 234 251 284   Basic Metabolic Panel: Recent Labs  Lab 01/09/24 0417 01/10/24 0407 01/11/24 0338 01/12/24 0327  01/13/24 0326 01/14/24 0652  NA 132* 137 139 138 140 140  K 3.9 2.9* 3.0* 3.6 3.2* 3.3*  CL 99 102 104 102 107 104  CO2 25 24 24 28 25 25   GLUCOSE 359* 278* 156* 194* 117* 131*  BUN 17 11 9 8 9 10   CREATININE 0.95 0.61 0.65 0.81 0.68 0.85  CALCIUM 8.5* 8.6* 8.6* 8.5* 8.9 9.0  MG 1.8 2.0 1.8 2.1  --   --   PHOS 2.6  --   --   --   --   --    GFR: Estimated Creatinine Clearance: 177.9 mL/min (by C-G formula based on SCr of 0.85 mg/dL). Liver Function Tests: Recent Labs  Lab 01/08/24 0419  AST 10*  ALT 13  ALKPHOS 83  BILITOT 1.2  PROT 6.7  ALBUMIN 3.4*   No results for input(s): "LIPASE", "AMYLASE" in the last 168 hours.  No results for input(s): "AMMONIA" in the last 168 hours. Coagulation Profile: No results for input(s): "INR", "PROTIME" in the last 168 hours. Cardiac Enzymes: No results for input(s): "CKTOTAL", "CKMB", "CKMBINDEX", "TROPONINI" in the last 168 hours. BNP (last 3 results) No results for input(s): "PROBNP" in the last 8760 hours. HbA1C: No results for input(s): "HGBA1C" in the last 72 hours.  CBG: Recent Labs  Lab 01/13/24 1638 01/13/24 1953 01/13/24 2312 01/14/24 0339 01/14/24 0736  GLUCAP 196* 203* 217* 193* 116*   Lipid Profile: No results for input(s): "CHOL", "HDL", "LDLCALC", "TRIG", "CHOLHDL", "LDLDIRECT" in the last 72 hours. Thyroid Function Tests: No results for input(s): "TSH", "T4TOTAL", "FREET4", "T3FREE", "THYROIDAB" in the last 72 hours. Anemia Panel: No results for input(s): "VITAMINB12", "FOLATE", "FERRITIN", "TIBC", "IRON", "RETICCTPCT" in the last 72 hours. Sepsis Labs: No results for input(s): "PROCALCITON", "LATICACIDVEN" in the last 168 hours.  No results found for this or any previous visit (from the past 240 hours).       Radiology Studies: No results found.          LOS: 5 days   Time spent= 35 mins    Joseph Art, DO [facility]  If 7PM-7AM, please contact night-coverage  01/14/2024,  11:11 AM

## 2024-01-14 NOTE — Progress Notes (Addendum)
     [facility] Progress Note  CC:   Nausea, vomiting, gastroparesis/delayed gastric emptying   Subjective:  Feeling much better.  Eating well without issues.  Still has not moved his bowels in several days though.  ACTH stim test was normal.  Up walking around the room at the time of my visit.  Objective:  Vital signs in last 24 hours: Temp:  [98 F (36.7 C)-98.9 F (37.2 C)] 98.2 F (36.8 C) (04/08 1329) Pulse Rate:  [61-93] 82 (04/08 1329) Resp:  [18-20] 20 (04/08 1329) BP: (91-130)/(57-80) 127/80 (04/08 1329) SpO2:  [98 %-100 %] 98 % (04/08 1329) Last BM Date : 01/12/24 General:  Alert, Well-developed, in NAD  Intake/Output from previous day: 04/07 0701 - 04/08 0700 In: 240 [P.O.:240] Out: -   Lab Results: Recent Labs    01/12/24 0327 01/13/24 0326 01/14/24 0855  WBC 10.5 13.5* 10.2  HGB 14.9 13.6 14.4  HCT 45.7 43.2 45.8  PLT 234 251 284   BMET Recent Labs    01/12/24 0327 01/13/24 0326 01/14/24 0652  NA 138 140 140  K 3.6 3.2* 3.3*  CL 102 107 104  CO2 28 25 25   GLUCOSE 194* 117* 131*  BUN 8 9 10   CREATININE 0.81 0.68 0.85  CALCIUM 8.5* 8.9 9.0   Assessment / Plan: 43 year old gentleman with a past medical history of asthma, morbid obesity, DM type II, perirectal abscess s/p I & D 2016, 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  His current constellation of symptoms seems to be consistent with an exacerbation of gastroparesis potentially in conjunction with a component of  cannabinoid hyperemesis.  His current CT findings of esophageal wall thickening are likely esophagitis as a sequelae of recurrent vomiting.     Much improved today.  Ok for discharge from a GI standpoint.   -AM cortisol level is low so ACTH stimulation test ordered and was normal. -Would send him home on reglan 10 mg BID for now (can increase if needed but he was only on once daily at home previously). -Continue Protonix 40 mg p.o. twice daily. -Maintain adequate bowel regimen as constipation may worsen upper GI symptoms.  Recommended use of miralax at home. -Avoidance of cannabis use in the long-term. -Small frequent meals would be best and I discussed that with him. -Needs good blood sugar control.    LOS: 5 days   Princella Pellegrini. Zehr  01/14/2024, 2:03 PM   Gastroenterology attending:  Cort stim test was ok. Agree w/ APP evaluation and plan.  Iva Boop, MD, [facility] See Loretha Stapler on call - gastroenterology for best contact person 01/14/2024 3:09 PM

## 2024-01-14 NOTE — Progress Notes (Signed)
 TOC meds in a secure bag delivered to pt in room by this RN. PIV x 1 removed by this RN

## 2024-01-14 NOTE — Progress Notes (Signed)
 This RN called lab to arrange ACTH stimulation lab and medication given after lab. Lab states they have to push the lab for 0700 and will let this RN know before they come. Lab states if they aren't able to draw before 7 it will be a little after. Care plan continued.

## 2024-01-14 NOTE — Progress Notes (Signed)
 Pt's tray arrived - pt eating and then will d/c to home

## 2024-01-14 NOTE — Progress Notes (Signed)
 AVS reviewed w/ pt who verbalized an understanding. No other questions at his time. Pt dressing for d/c to home. Ride enroute

## 2024-01-17 DIAGNOSIS — E1165 Type 2 diabetes mellitus with hyperglycemia: Secondary | ICD-10-CM | POA: Diagnosis not present

## 2024-01-17 DIAGNOSIS — Z6839 Body mass index (BMI) 39.0-39.9, adult: Secondary | ICD-10-CM | POA: Diagnosis not present

## 2024-01-17 DIAGNOSIS — K3184 Gastroparesis: Secondary | ICD-10-CM | POA: Diagnosis not present

## 2024-01-17 DIAGNOSIS — E1143 Type 2 diabetes mellitus with diabetic autonomic (poly)neuropathy: Secondary | ICD-10-CM | POA: Diagnosis not present

## 2024-03-17 ENCOUNTER — Telehealth: Payer: Self-pay | Admitting: Pharmacist

## 2024-03-17 NOTE — Progress Notes (Signed)
   03/17/2024  Patient ID: Shawn Benson, male   DOB: 1981-09-01, 43 y.o.   MRN: [id number]  Patient was identified for DM counseling based on A1c greater than 8% on last lab results.   Patient agreed to schedule for a free DM initial visit with me as the pharmacist. All changes are made in collaboration with the PCP. A phone visit was scheduled for 6/20 at 10AM. Patient's PCP located at [facility]. Patient advised to attend with information regarding diabetes medications and any potential barriers surrounding current treatment. If needing to re-schedule or questions prior to the visit, patient advised to contact me at [phone].     Delvin File, PharmD [facility]  Phone Number: [phone]

## 2024-03-18 ENCOUNTER — Encounter: Payer: Self-pay | Admitting: Podiatry

## 2024-03-18 ENCOUNTER — Ambulatory Visit (INDEPENDENT_AMBULATORY_CARE_PROVIDER_SITE_OTHER): Payer: BC Managed Care – PPO | Admitting: Podiatry

## 2024-03-18 DIAGNOSIS — M79674 Pain in right toe(s): Secondary | ICD-10-CM

## 2024-03-18 DIAGNOSIS — E1142 Type 2 diabetes mellitus with diabetic polyneuropathy: Secondary | ICD-10-CM

## 2024-03-18 DIAGNOSIS — B353 Tinea pedis: Secondary | ICD-10-CM | POA: Diagnosis not present

## 2024-03-18 DIAGNOSIS — E119 Type 2 diabetes mellitus without complications: Secondary | ICD-10-CM | POA: Diagnosis not present

## 2024-03-18 DIAGNOSIS — B351 Tinea unguium: Secondary | ICD-10-CM | POA: Diagnosis not present

## 2024-03-18 DIAGNOSIS — M2141 Flat foot [pes planus] (acquired), right foot: Secondary | ICD-10-CM | POA: Diagnosis not present

## 2024-03-18 DIAGNOSIS — M79675 Pain in left toe(s): Secondary | ICD-10-CM

## 2024-03-18 DIAGNOSIS — M2142 Flat foot [pes planus] (acquired), left foot: Secondary | ICD-10-CM

## 2024-03-18 MED ORDER — KETOCONAZOLE 2 % EX CREA: TOPICAL_CREAM | CUTANEOUS | 1 refills | Status: AC

## 2024-03-18 NOTE — Progress Notes (Signed)
 ANNUAL DIABETIC FOOT EXAM  Subjective: Shawn Benson presents today for annual diabetic foot exam.  Chief Complaint  Patient presents with   [facility]    [id number] A1c 10/ blood sugar 123/pcp last visit April 2025   Patient confirms h/o diabetes.  Patient denies any h/o foot wounds.  Patient has been diagnosed with neuropathy. States he takes gabapentin , but still has foot pain and numbness.   Dickey Fought, PA is patient's PCP.  Past Medical History:  Diagnosis Date   Allergy    Seasonal--spring through fall.   Asthma childhood and adult   triggers are allergies and URI.  Spring, summer and fall.   Diabetes mellitus without complication (HCC)    Morbid obesity (HCC)    Peripheral edema 04/09/2017   Patient Active Problem List   Diagnosis Date Noted   Delayed gastric emptying 01/12/2024   Esophagitis 01/12/2024   Constipation 01/12/2024   Gastroparesis 01/09/2024   Elevated blood pressure reading 01/08/2024   Nausea & vomiting 01/07/2024   Type 2 diabetes mellitus with diabetic polyneuropathy, with long-term current use of insulin  (HCC) 06/03/2023   Type 2 diabetes mellitus with hyperglycemia, with long-term current use of insulin  (HCC) 06/03/2023   Hypokalemia 11/30/2022   Diabetic gastroparesis (HCC) 11/28/2022   Dysphagia 11/27/2022   Nausea and vomiting 11/27/2022   Uncontrolled type 2 diabetes mellitus with hyperglycemia (HCC) 11/26/2022   Diabetic polyneuropathy associated with type 2 diabetes mellitus (HCC) 11/26/2022   High anion gap metabolic acidosis 11/26/2022   Abscessed tooth 07/08/2020   Closed fracture of metacarpal bone 01/20/2020   Pain in right hand 01/20/2020   Peripheral edema 04/09/2017   Dyslipidemia 09/14/2016   Sleep apnea 07/10/2016   Tinea corporis 07/10/2016   Tinea pedis 07/10/2016   Abscess of axilla, left 05/11/2016   Onychomycosis of toenail 12/06/2015   Onychomycosis of multiple toenails with type 2 diabetes mellitus (HCC) 12/06/2015    Type 2 diabetes mellitus without complication, with long-term current use of insulin  (HCC) 10/07/2015   Asthma 10/06/2015   Morbid obesity due to excess calories (HCC)    Past Surgical History:  Procedure Laterality Date   INCISION AND DRAINAGE PERIRECTAL ABSCESS N/A 10/06/2015   Procedure: IRRIGATION AND DEBRIDEMENT PERIANAL  ABSCESS;  Surgeon: Juanita Norlander, MD;  Location: [facility];  Service: General;  Laterality: N/A;   KNEE SURGERY     Right and Left Knee   UPPER GASTROINTESTINAL ENDOSCOPY  01/08/2023   Current Outpatient Medications on File Prior to Visit  Medication Sig Dispense Refill   Continuous Glucose Sensor (DEXCOM G7 SENSOR) MISC Change sensors every 10 days 9 each 3   gabapentin  (NEURONTIN ) 300 MG capsule Take 300 mg by mouth 2 (two) times daily.     insulin  glargine, 2 Unit Dial , (TOUJEO  MAX SOLOSTAR) 300 UNIT/ML Solostar Pen Inject 70 Units into the skin daily in the afternoon. 30 mL 4   insulin  lispro (HUMALOG  KWIKPEN) 100 UNIT/ML KwikPen Max daily 30 units (Patient taking differently: Inject 0-12 Units into the skin in the morning and at bedtime.) 30 mL 3   Insulin  Pen Needle (B-D ULTRAFINE III SHORT PEN) 31G X 8 MM MISC 1 Device by Does not apply route in the morning, at noon, in the evening, and at bedtime. 400 each 3   metoCLOPramide  (REGLAN ) 10 MG tablet Take 1 tablet (10 mg total) by mouth 2 (two) times daily. 60 tablet 2   nystatin  (MYCOSTATIN ) 100000 UNIT/ML suspension Take 5 mLs (500,000 Units total) by  mouth 4 (four) times daily. 140 mL 0   pantoprazole  (PROTONIX ) 40 MG tablet Take 1 tablet (40 mg total) by mouth 2 (two) times daily before a meal. 60 tablet 2   ergocalciferol (VITAMIN D2) 1.25 MG (50000 UT) capsule 1 capsule right after a meal by mouth once a week (Patient not taking: Reported on 01/08/2024)     ondansetron  (ZOFRAN -ODT) 4 MG disintegrating tablet Take 1 tablet (4 mg total) by mouth every 8 (eight) hours as needed for up to 15 doses for nausea or  vomiting. (Patient not taking: Reported on 01/08/2024) 15 tablet 0   promethazine  (PHENERGAN ) 25 MG suppository Place 1 suppository (25 mg total) rectally every 6 (six) hours as needed for up to 6 doses for refractory nausea / vomiting. (Patient not taking: Reported on 01/08/2024) 6 each 0   rosuvastatin  (CRESTOR ) 10 MG tablet 1 tablet Orally Once a day for 90 days (Patient not taking: Reported on 01/08/2024)     No current facility-administered medications on file prior to visit.    Allergies  Allergen Reactions   Metformin  And Related Swelling   Other Swelling    Rybelsus (semaglutide)   Semaglutide Swelling   Social History   Occupational History   Occupation: Lobbyist work--side jobs   Occupation: Occupational hygienist  Tobacco Use   Smoking status: Every Day    Types: Cigarettes   Smokeless tobacco: Never   Tobacco comments:        06/04/23 Patient states he smokes about 5 cigarettes a day.        07/18/2022 patient states he smokes 1/2 pack daily  Vaping Use   Vaping status: Some Days   Substances: Nicotine, Flavoring  Substance and Sexual Activity   Alcohol use: Yes    Alcohol/week: 0.0 standard drinks of alcohol    Comment: occasional beer   Drug use: Yes    Types: Marijuana   Sexual activity: Yes    Partners: Female    Birth control/protection: None    Comment: Already a grandfather.  States his youngest child's mother would like to get pregnant   Family History  Problem Relation Age of Onset   Diabetes Mother    Diabetes Father    Diabetes Brother    Diabetes Brother    Colon cancer Neg Hx    Stomach cancer Neg Hx    Esophageal cancer Neg Hx    Immunization History  Administered Date(s) Administered   Influenza Inj Mdck Quad Pf 09/14/2016   Influenza,inj,Quad PF,6+ Mos 12/06/2015   PFIZER(Purple Top)SARS-COV-2 Vaccination 12/22/2019, 01/12/2020   Pneumococcal Polysaccharide-23 03/30/2016   Tdap 07/11/2012, 03/15/2019     Review of Systems: Negative except  as noted in the HPI.   Objective: There were no vitals filed for this visit.  Shawn Benson is a pleasant 43 y.o. male in NAD. AAO X 3.  Diabetic foot exam was performed with the following findings:   Vascular Examination: CFT <3 seconds b/l. DP/PT pulses faintly palpable b/l. Skin temperature gradient warm to warm b/l. No pain with calf compression. No ischemia or gangrene. No cyanosis or clubbing noted b/l. No edema noted b/l LE.   Neurological Examination: Sensation decreased with 10 gram monofilament. Vibratory sensation intact b/l. Pt has subjective symptoms of neuropathy.  Dermatological Examination: Pedal skin warm and supple b/l.   No open wounds. No interdigital macerations.  Toenails 1-5 b/l thick, discolored, elongated with subungual debris and pain on dorsal palpation.    No corns, calluses nor  porokeratotic lesions noted. Pedal skin noted to be dry b/l lower extremities. Diffuse scaling noted peripherally and plantarly b/l feet.  No interdigital macerations.  No blisters, no weeping. No signs of secondary bacterial infection noted.  Musculoskeletal Examination: Muscle strength 5/5 to all lower extremity muscle groups bilaterally. Pes planus deformity noted bilateral LE. Patient ambulates independent of any assistive aids.  Radiographs: None     Lab Results  Component Value Date   HGBA1C 13.2 (H) 01/08/2024   ADA Risk Categorization: High Risk  Patient has one or more of the following: Loss of protective sensation Absent pedal pulses Severe Foot deformity History of foot ulcer  Assessment: 1. Pain due to onychomycosis of toenails of both feet   2. Tinea pedis of both feet   3. Pes planus of both feet   4. Diabetic peripheral neuropathy associated with type 2 diabetes mellitus (HCC)   5. Encounter for diabetic foot exam (HCC)     Plan: Meds ordered this encounter  Medications   ketoconazole  (NIZORAL ) 2 % cream    Sig: Apply to both feet and between  toes once daily for 6 weeks.    Dispense:  60 g    Refill:  1  Diabetic foot examination performed today. All patient's and/or POA's questions/concerns addressed on today's visit. Toenails 1-5 debrided in length and girth without incident. Continue foot and shoe inspections daily. Monitor blood glucose per PCP/Endocrinologist's recommendations. Continue soft, supportive shoe gear daily. Report any pedal injuries to medical professional. Call office if there are any questions/concerns. Discussed painful diabetic neuropathy. Discussed Qutenza treatment for painful neuropathy despite taking gabapentin . He is interested in this treatment. Will obtain preauthorization for Qutenza Topical treatment. Will inform patient of outcome of preauthorization. He related understanding. -Rx for [employer] [facility] [employer] dispensed to patient for one pair diabetic shoes and 3 pair custom insoles. -For dry skin, recommended daily use of Bag Balm Hand and Body Moistuizer which may be purchased at local drug store or on [employer]. -Discussed tinea pedis infection. To prevent re-infection of tinea pedis, patient/POA/caregiver instructed to spray shoes with Lysol every evening and clean tub/shower with bleach based cleanser. Rx sent to pharmacy for Ketoconazole  Cream 2% to be applied to both feet and between toes once daily for six week.  -Patient/POA to call should there be question/concern in the interim. Return in about 3 months (around 06/18/2024).  Luella Sager, DPM      [facility] LOCATION: [address] [place] [address]                   Office [phone]   [facility] LOCATION: [address] [place], [place] [address] Office [phone]

## 2024-03-18 NOTE — Patient Instructions (Signed)
 For dry or cracked skin, recommend daily use of Bag Balm Hand and Body Moistuizer which may be purchased at local drug store or on [employer].  WE WILL ATTEMPT TO GET AUTHORIZATION FOR QUTENZA TOPICAL TREATMENT FOR YOUR PAINFUL DIABETIC NEUROPATHY  CALL [facility] IN ONE WEEK TO SCHEDULE APPOINTMENT TO BE MEASURED FOR YOUR DIABETIC SHOES.  ATHLETE'S FEET:  To prevent reinfection, spray shoes with lysol every evening.  Clean tub or shower with bleach based cleanser.  Athlete's Foot Athlete's foot (tinea pedis) is a fungal infection of the skin on your feet. It often occurs on the skin that is between or underneath the toes. It can also occur on the soles of your feet. The infection can spread from person to person (is contagious). It can also spread when a person's bare feet come in contact with the fungus on shower floors or on items such as shoes. What are the causes? This condition is caused by a fungus that grows in warm, moist places. You can get athlete's foot by sharing shoes, shower stalls, towels, and wet floors with someone who is infected. Not washing your feet or changing your socks often enough can also lead to athlete's foot. What increases the risk? This condition is more likely to develop in: Men. People who have a weak body defense system (immune system). People who have diabetes. People who use public showers, such as at a gym. People who wear heavy-duty shoes, such as Youth worker. Seasons with warm, humid weather. What are the signs or symptoms? Symptoms of this condition include: Itchy areas between your toes or on the soles of your feet. White, flaky, or scaly areas between your toes or on the soles of your feet. Very itchy small blisters between your toes or on the soles of your feet. Small cuts in your skin. These cuts can become infected. Thick or discolored toenails. How is this diagnosed? This condition may be diagnosed  with a physical exam and a review of your medical history. Your health care provider may also take a skin or toenail sample to examine under a microscope. How is this treated? This condition is treated with antifungal medicines. These may be applied as powders, ointments, or creams. In severe cases, an oral antifungal medicine may be given. Follow these instructions at home: Medicines Apply or take over-the-counter and prescription medicines only as told by your health care provider. Apply your antifungal medicine as told by your health care provider. Do not stop using the antifungal even if your condition improves. Foot care Do not scratch your feet. Keep your feet dry: Wear cotton or wool socks. Change your socks every day or if they become wet. Wear shoes that allow air to flow, such as sandals or canvas tennis shoes. Wash and dry your feet, including the area between your toes. Also, wash and dry your feet: Every day or as told by your health care provider. After exercising. General instructions Do not let others use towels, shoes, nail clippers, or other personal items that touch your feet. Protect your feet by wearing sandals in wet areas, such as locker rooms and shared showers. Keep all follow-up visits. This is important. If you have diabetes, keep your blood sugar under control. Contact a health care provider if: You have a fever. You have swelling, soreness, warmth, or redness in your foot. Your feet are not getting better with treatment. Your symptoms get worse. You have new symptoms. You have severe  pain. Summary Athlete's foot (tinea pedis) is a fungal infection of the skin on your feet. It often occurs on skin that is between or underneath the toes. This condition is caused by a fungus that grows in warm, moist places. Symptoms include white, flaky, or scaly areas between your toes or on the soles of your feet. This condition is treated with antifungal medicines. Keep  your feet clean. Always dry them thoroughly. This information is not intended to replace advice given to you by your health care provider. Make sure you discuss any questions you have with your health care provider. Document Revised: 01/15/2021 Document Reviewed: 01/15/2021 Elsevier Patient Education  2024 [employer].

## 2024-03-27 ENCOUNTER — Telehealth: Payer: Self-pay | Admitting: Pharmacist

## 2024-03-27 NOTE — Progress Notes (Signed)
 03/27/2024  Patient ID: Shawn Benson, male   DOB: 17-Mar-1981, 43 y.o.   MRN: [id number]    03/27/2024 Name: Shawn Benson MRN: [id number] DOB: 1981/03/09  Chief Complaint  Patient presents with   Diabetes    Shawn Benson is a 43 y.o. year old male who presented for a telephone visit.   They were referred to the pharmacist by a quality report for assistance in managing diabetes.   TNM DM patient  Subjective:  Care Team: Primary Care Provider: Dickey Fought, PA ; Next Scheduled Visit: 03/24/24 Clinical Pharmacist: Delvin File, PharmD  Medication Access/Adherence  Current Pharmacy:  [facility] [address] [place] - [address] [place] [address] Phone: [phone] Fax: [phone]  [facility] - [facility] [address] [place] [address] Phone: [phone] Fax: [phone]   Patient reports affordability concerns with their medications: No - insulin  affordable Patient reports access/transportation concerns to their pharmacy: No  Patient reports adherence concerns with their medications:  No  - does report missing Pantoprazole , metoclopramide , and gabapentin  second dose at bedtime occasionally   Diabetes:  Current medications: Lispro sliding scale 6-8 units 4 times a day, Toujeo  U-300- 70 units daily in AM Medications tried in the past: Jardiance  10mg  (leg swelling?), Rybelsus (leg swelling?), metformin  (leg swelling?)   Date of Download: 03/27/24 % Time CGM is active: 86% Average Glucose: 143 mg/dL Glucose Management Indicator: 6.7%  Glucose Variability: 37 (goal <36%) Time in Goal:  - Time very high: 1% - Time high: 17% - Time in range: 82% - Time below: 0%     Patient denies hypoglycemic s/sx including dizziness, shakiness, sweating. Patient denies hyperglycemic symptoms including polyuria, polydipsia, polyphagia, nocturia, neuropathy, blurred vision.  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 Sig Documenting Provider Last Dose Status Informant  Continuous Glucose Sensor (DEXCOM G7 SENSOR) MISC [id number] Yes Change sensors every 10 days Shamleffer, Julian Obey, MD  Active Self, Pharmacy Records  ergocalciferol (VITAMIN D2) 1.25 MG (50000 UT) capsule [id number] Yes 1 capsule right after a meal by mouth once a week [provider]  Active Self, Pharmacy Records  gabapentin  (NEURONTIN ) 300 MG capsule [id number] Yes Take 300 mg by mouth 2 (two) times daily. [provider]  Active Self, Pharmacy Records  insulin  glargine, 2 Unit Dial , (TOUJEO  MAX SOLOSTAR) 300 UNIT/ML Solostar Pen [id number] Yes Inject 70 Units into  the skin daily in the afternoon. Shamleffer, Julian Obey, MD  Active Self, Pharmacy Records  insulin  lispro (HUMALOG  KWIKPEN) 100 UNIT/ML KwikPen [id number] Yes Max daily 30 units Shamleffer, Ibtehal Jaralla, MD  Active Self, Pharmacy Records           Med Note [place], [place] M   Fri Mar 27, 2024 10:44 AM)     Insulin  Pen Needle (B-D ULTRAFINE III SHORT PEN) 31G X 8 MM MISC [id number] Yes 1 Device by Does not apply route in the morning, at noon, in the evening, and at bedtime. Shamleffer, Julian Obey, MD  Active Self, Pharmacy Records  ketoconazole  (NIZORAL ) 2 % cream [id number] Yes Apply to both feet and between toes once daily for 6 weeks. Luella Sager, DPM  Active   metoCLOPramide  (REGLAN ) 10 MG tablet [id number] Yes Take 1 tablet (10 mg total) by mouth 2 (two) times daily. Vann, Jessica U, DO  Active   nystatin  (MYCOSTATIN ) 100000 UNIT/ML suspension [id number] Yes Take 5 mLs (500,000 Units total) by mouth 4 (four) times daily. Vann, Jessica U, DO  Active    Patient not taking:   Discontinued 03/27/24 1045 (Completed Course) pantoprazole  (PROTONIX ) 40 MG tablet [id number] Yes Take 1 tablet (40 mg total) by mouth 2 (two) times daily before a meal. Enrigue Harvard, DO  Active    Patient not taking:   Discontinued 03/27/24 1045 (Completed Course) rosuvastatin  (CRESTOR ) 10 MG tablet [id number] Yes 1 tablet Orally Once a day for 90 days [provider]  Active Self, Pharmacy Records              Assessment/Plan:   Diabetes: - Currently uncontrolled - Reviewed long term cardiovascular and renal outcomes of uncontrolled blood sugar - Reviewed goal A1c, goal fasting, and goal 2 hour post prandial glucose - Reviewed dietary modifications including minimizing carbs and sugars - Recommend to check glucose continuously with Dexcom    Follow Up Plan:  - Follow-up call in 3 months on 9/22 at 10AM for Dexcom Clarity review - Patient's Dexcom report shows his A1c is at goal - Will review how A1c looks at 7/16 PCP visit - Not working on dietary changes currently, just doing insulin  as trained on how to do by hospital - Sometimes forgets second dose of Gabapentin , Metoclopramide , and Pantoprazole - advised these are comfort medications; just needs to ensure to take rosuvastatin   daily   Delvin File, PharmD [facility]  Phone Number: [phone]

## 2024-04-23 DIAGNOSIS — E1143 Type 2 diabetes mellitus with diabetic autonomic (poly)neuropathy: Secondary | ICD-10-CM | POA: Diagnosis not present

## 2024-04-23 DIAGNOSIS — M25511 Pain in right shoulder: Secondary | ICD-10-CM | POA: Diagnosis not present

## 2024-04-23 DIAGNOSIS — Z6841 Body Mass Index (BMI) 40.0 and over, adult: Secondary | ICD-10-CM | POA: Diagnosis not present

## 2024-04-23 DIAGNOSIS — E1165 Type 2 diabetes mellitus with hyperglycemia: Secondary | ICD-10-CM | POA: Diagnosis not present

## 2024-04-23 DIAGNOSIS — Z794 Long term (current) use of insulin: Secondary | ICD-10-CM | POA: Diagnosis not present

## 2024-04-29 LAB — HEMOGLOBIN A1C: Hemoglobin A1C: 8.5

## 2024-05-11 DIAGNOSIS — M25511 Pain in right shoulder: Secondary | ICD-10-CM | POA: Diagnosis not present

## 2024-05-25 DIAGNOSIS — M2142 Flat foot [pes planus] (acquired), left foot: Secondary | ICD-10-CM | POA: Diagnosis not present

## 2024-05-25 DIAGNOSIS — M2141 Flat foot [pes planus] (acquired), right foot: Secondary | ICD-10-CM | POA: Diagnosis not present

## 2024-06-03 DIAGNOSIS — M25511 Pain in right shoulder: Secondary | ICD-10-CM | POA: Diagnosis not present

## 2024-06-17 DIAGNOSIS — M25511 Pain in right shoulder: Secondary | ICD-10-CM | POA: Diagnosis not present

## 2024-06-22 DIAGNOSIS — M25511 Pain in right shoulder: Secondary | ICD-10-CM | POA: Diagnosis not present

## 2024-06-24 DIAGNOSIS — M25511 Pain in right shoulder: Secondary | ICD-10-CM | POA: Diagnosis not present

## 2024-06-29 ENCOUNTER — Telehealth: Payer: Self-pay | Admitting: Pharmacist

## 2024-06-29 NOTE — Progress Notes (Signed)
   06/29/2024  Patient ID: Shawn Benson, male   DOB: 12/16/1980, 43 y.o.   MRN: [id number]  Shawn P Keady is a 43 y.o. year old male who presented for a telephone visit. They were referred to the pharmacist by a quality report for assistance in managing diabetes. TNM DM patient   Patient reports affordability concerns with their medications: No - insulin  affordable Patient reports access/transportation concerns to their pharmacy: No  Patient reports adherence concerns with their medications:  No     Diabetes:  Current medications: Lispro sliding scale 6-20 units 4 times a day, Toujeo  U-300- 70 units daily in AM Medications tried in the past: Jardiance  10mg  (leg swelling?), Rybelsus (leg swelling?), metformin  (leg swelling?)  Date of Download: 06/29/24 % Time CGM is active: 92% Average Glucose: 259 mg/dL Glucose Management Indicator: 9.5%  Glucose Variability: 52 (goal <36%) Time in Goal:  - Time very high: 57% - Time high: 37% - Time in range: 6% - Time below: 0%  Date of Download: 03/27/24 % Time CGM is active: 86% Average Glucose: 143 mg/dL Glucose Management Indicator: 6.7%  Glucose Variability: 37 (goal <36%) Time in Goal:  - Time very high: 1% - Time high: 17% - Time in range: 82% - Time below: 0%   Patient denies hypoglycemic s/sx including dizziness, shakiness, sweating. Patient denies hyperglycemic symptoms including polyuria, polydipsia, polyphagia, nocturia, neuropathy, blurred vision.  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  A1c 7/17: 8.5%  Assessment/Plan:   Diabetes: - Currently uncontrolled - Reviewed  long term cardiovascular and renal outcomes of uncontrolled blood sugar - Reviewed goal A1c, goal fasting, and goal 2 hour post prandial glucose - Reviewed dietary modifications including minimizing carbs and sugars - Recommend to check glucose continuously with Dexcom   *Summary for PCP:* - Follow-up call on 10/20 at 10:30AM for Dexcom review - Strongly advised patient to do mealtime insulin  again- only doing 1 shot per day at this time - Reports he started doing better with his A1c and then started to slack as time progressed - Reports not having enough money to eat properly at this time   Aloysius Lewis, PharmD [facility]  Phone Number: [phone]

## 2024-07-01 DIAGNOSIS — M25511 Pain in right shoulder: Secondary | ICD-10-CM | POA: Diagnosis not present

## 2024-07-06 DIAGNOSIS — M25511 Pain in right shoulder: Secondary | ICD-10-CM | POA: Diagnosis not present

## 2024-07-08 ENCOUNTER — Ambulatory Visit: Admitting: Podiatry

## 2024-07-08 ENCOUNTER — Encounter: Payer: Self-pay | Admitting: Podiatry

## 2024-07-08 DIAGNOSIS — E1142 Type 2 diabetes mellitus with diabetic polyneuropathy: Secondary | ICD-10-CM | POA: Diagnosis not present

## 2024-07-08 DIAGNOSIS — M79675 Pain in left toe(s): Secondary | ICD-10-CM | POA: Diagnosis not present

## 2024-07-08 DIAGNOSIS — B351 Tinea unguium: Secondary | ICD-10-CM

## 2024-07-08 DIAGNOSIS — M79674 Pain in right toe(s): Secondary | ICD-10-CM

## 2024-07-09 ENCOUNTER — Encounter: Payer: Self-pay | Admitting: Lab

## 2024-07-12 NOTE — Progress Notes (Signed)
  Subjective:  Patient ID: Shawn Benson, male    DOB: [birth year],  MRN: [id number]  Shawn Benson presents to clinic today for at risk foot care with history of diabetic neuropathy and painful elongated mycotic toenails 1-5 bilaterally which are tender when wearing enclosed shoe gear. Pain is relieved with periodic professional debridement. Patient states his right great toenail is loose. Chief Complaint  Patient presents with   Diabetes    RFC IDDM A1C 9.5. Toenail trim. LOV with PCP 01/17/24.   New problem(s): None.   PCP is Sheldon Netter, [place].  Allergies  Allergen Reactions   Metformin  And Related Swelling   Other Swelling    Rybelsus (semaglutide)   Semaglutide Swelling    Review of Systems: Negative except as noted in the HPI.  Objective:  There were no vitals filed for this visit. Shawn Benson is a pleasant 43 y.o. male obese in NAD. AAO x 3.  Vascular Examination: CFT <3 seconds b/l. DP/PT pulses faintly palpable b/l. Skin temperature gradient warm to warm b/l. No pain with calf compression. No ischemia or gangrene. No cyanosis or clubbing noted b/l. No edema noted b/l LE.   Neurological Examination: Sensation decreased with 10 gram monofilament. Vibratory sensation intact b/l. Pt has subjective symptoms of neuropathy.  Dermatological Examination: Pedal skin warm and supple b/l.   No open wounds. No interdigital macerations.  Toenails left great toe and 2-5 b/l thick, discolored, elongated with subungual debris and pain on dorsal palpation.    There is noted onchyolysis of entire nailplate of right great toe.  The nailbed remains intact. There is no erythema, no edema, no drainage, no underlying fluctuance.  No corns, calluses nor porokeratotic lesions noted. Pedal skin noted to be dry b/l lower extremities.   Musculoskeletal Examination: Muscle strength 5/5 to all lower extremity muscle groups bilaterally. Pes planus deformity noted bilateral LE.  Patient ambulates independent of any assistive aids.  Radiographs: None  Assessment/Plan: 1. Pain due to onychomycosis of toenails of both feet   2. Diabetic peripheral neuropathy associated with type 2 diabetes mellitus (HCC)   -Patient was evaluated today. All questions/concerns addressed on today's visit. -Examined patient. -Patient instructed to apply OTC antifungal spray powder between toes once daily. -Continue foot and shoe inspections daily. Monitor blood glucose per PCP/Endocrinologist's recommendations. -Mycotic toenails 2-5 bilaterally and L hallux were debrided in length and girth with sterile nail nippers and dremel without iatrogenic bleeding. -Loose nailplate right great toe gently debrided en toto. Digital nailbed cleansed with alcohol. Triple antibiotic ointment applied. No further treatment required by patient. -Patient/POA to call should there be question/concern in the interim.   Return in about 3 months (around 10/08/2024).  Delon LITTIE Merlin, DPM      [place] LOCATION: [address] [place] [address]                   Office [phone]   [facility] LOCATION: [address] [place], [place] [address] Office [phone]

## 2024-07-27 ENCOUNTER — Telehealth: Payer: Self-pay | Admitting: Pharmacist

## 2024-07-27 NOTE — Progress Notes (Signed)
   07/27/2024  Patient ID: Shawn Benson, male   DOB: 10/04/1981, 43 y.o.   MRN: [id number]  Shawn Benson is a 43 y.o. year old male who presented for a telephone visit. They were referred to the pharmacist by a quality report for assistance in managing diabetes. TNM DM patient   Patient reports affordability concerns with their medications: No - insulin  affordable Patient reports access/transportation concerns to their pharmacy: No  Patient reports adherence concerns with their medications:  No     Diabetes:  Current medications: Lispro sliding scale 12-18 units (taking daily), Toujeo  U-300- 70 units daily in AM Medications tried in the past: Jardiance  10mg  (leg swelling?), Rybelsus (leg swelling?), metformin  (leg swelling?)  Date of download: 07/27/24 Average BG: 299 mg/dL GMI: 89.4% Standard deviation: 63 mg/dL Coefficient of Variation: 21.0% 80% Very high 15% High 5% In range 0% Low 0% Very low  Date of Download: 06/29/24 % Time CGM is active: 92% Average Glucose: 259 mg/dL Glucose Management Indicator: 9.5%  Glucose Variability: 52 (goal <36%) Time in Goal:  - Time very high: 57% - Time high: 37% - Time in range: 6% - Time below: 0%  Date of Download: 03/27/24 % Time CGM is active: 86% Average Glucose: 143 mg/dL Glucose Management Indicator: 6.7%  Glucose Variability: 37 (goal <36%) Time in Goal:  - Time very high: 1% - Time high: 17% - Time in range: 82% - Time below: 0%   Patient denies hypoglycemic s/sx including dizziness, shakiness, sweating. Patient denies hyperglycemic symptoms including polyuria, polydipsia, polyphagia, nocturia, neuropathy, blurred vision.  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  A1c 7/17: 8.5%  Assessment/Plan:   Diabetes: - Currently uncontrolled - Reviewed long term cardiovascular and renal outcomes of uncontrolled blood sugar - Reviewed goal A1c, goal fasting, and goal 2 hour post prandial glucose - Reviewed dietary modifications including minimizing carbs and sugars - Recommend to check glucose continuously with Dexcom   *Summary for PCP:* - Follow-up call on 10/17 at 10:00AM for Dexcom review - Strongly advised patient to do mealtime insulin  again- only doing 1 shot per day at this time - INCREASE Toujeo  to 76 units each morning- need to get baseline down if only doing mealtime 1x daily - Reports not having enough money to eat properly at this time- only eating 1 meal per day   Shawn Benson, PharmD [facility]  Phone Number: [phone]

## 2024-08-24 ENCOUNTER — Telehealth: Payer: Self-pay | Admitting: Pharmacist

## 2024-08-24 NOTE — Progress Notes (Signed)
   08/24/2024  Patient ID: Shawn Benson, male   DOB: 13-Sep-1981, 43 y.o.   MRN: [id number]  Called and spoke with the patient on the phone this morning. Reports doing well at this time.   Average glucose: 276 mg/dL GMI: 0.0%  29% Very high 23% High 7% In range 0% Low 0% Very low  Appears that the 6 additional units of insulin  brought his average BG's down by 25 points. May need around 100 units of insulin  total to get his basal readings at goal. Continues with only doing 1 basal dose in the morning and 1 meal time insulin  later in the day. Aware more frequetn mealtime insulin  would get him at goal, but will continue this regimen for now.  Asked about follow-up with Dr. Braulio follow-up. [place] he can reschedule if needed- advised I will request someone from the office to call him.  Mentioned GLP-1 agonist medications. Reports he does NOT want this- has seen family and friends have back reactions. Prefers insulin  for now.   For future, could consider switching to a Humalog  75/25mg  twice a day instead.   Asked about getting med refills for everything. Wants it all prior to the new insurance start on December 1st. Will see if anything is missing.   Gets: Toujeo , Lispro, Pantoprazole , Rosuvastatin ,  Needs refills on Dexcom sensors (10/31 30DS), **metoclopramide  + pantop (sold 8/28 90DS). **Gaba 300mg  (7/11 90DS), Lispro (6/3 84DS)  Needs refills on ** meds. Sending Request to PCP. Will call pharmacy on 11/25 to have all of these refilled for him.   For follow-up will set task in January after PCP visit to see how things are looking.    Aloysius Lewis, PharmD, [facility] Phone Number: [phone]

## 2024-09-16 DIAGNOSIS — M7501 Adhesive capsulitis of right shoulder: Secondary | ICD-10-CM | POA: Diagnosis not present

## 2024-09-16 DIAGNOSIS — M19011 Primary osteoarthritis, right shoulder: Secondary | ICD-10-CM | POA: Diagnosis not present

## 2024-09-16 DIAGNOSIS — M7521 Bicipital tendinitis, right shoulder: Secondary | ICD-10-CM | POA: Diagnosis not present

## 2024-09-16 DIAGNOSIS — M75111 Incomplete rotator cuff tear or rupture of right shoulder, not specified as traumatic: Secondary | ICD-10-CM | POA: Diagnosis not present

## 2024-09-22 DIAGNOSIS — M25511 Pain in right shoulder: Secondary | ICD-10-CM | POA: Diagnosis not present

## 2024-09-24 DIAGNOSIS — B351 Tinea unguium: Secondary | ICD-10-CM | POA: Diagnosis not present

## 2024-09-24 DIAGNOSIS — M79674 Pain in right toe(s): Secondary | ICD-10-CM | POA: Diagnosis not present

## 2024-09-24 DIAGNOSIS — M79675 Pain in left toe(s): Secondary | ICD-10-CM | POA: Diagnosis not present

## 2024-10-21 ENCOUNTER — Ambulatory Visit: Payer: Self-pay | Admitting: Podiatry

## 2024-10-21 ENCOUNTER — Encounter: Payer: Self-pay | Admitting: Podiatry

## 2024-10-21 DIAGNOSIS — M79675 Pain in left toe(s): Secondary | ICD-10-CM

## 2024-10-21 DIAGNOSIS — B351 Tinea unguium: Secondary | ICD-10-CM

## 2024-10-21 DIAGNOSIS — E1142 Type 2 diabetes mellitus with diabetic polyneuropathy: Secondary | ICD-10-CM

## 2024-10-21 DIAGNOSIS — M79674 Pain in right toe(s): Secondary | ICD-10-CM

## 2024-10-21 NOTE — Progress Notes (Addendum)
"  °  Subjective:  Patient ID: Shawn Benson, male    DOB: 11-12-1980,  MRN: [id number]  Shawn Benson presents to clinic today for at risk foot care with history of diabetic neuropathy and painful thick toenails that are difficult to trim. Pain interferes with ambulation. Aggravating factors include wearing enclosed shoe gear. Pain is relieved with periodic professional debridement.  Chief Complaint  Patient presents with   Shawn Benson    Shawn Benson. A1c around 10. Per arm monitor. Shawn Benson, Shawn Benson Ref Provider (PCP)    New problem(s): None.   PCP is Shawn Benson, Shawn Benson.  Allergies[1]  Review of Systems: Negative except as noted in the HPI.  Objective: No changes noted in today's physical examination. There were no vitals filed for this visit. Shawn Benson is a pleasant 44 y.o. male obese in NAD. AAO x 3.  Vascular Examination: CFT <3 seconds b/l. DP/PT pulses faintly palpable b/l. Skin temperature gradient warm to warm b/l. No pain with calf compression. No ischemia or gangrene. No cyanosis or clubbing noted b/l. No edema noted b/l LE.   Neurological Examination: Sensation decreased with 10 gram monofilament. Vibratory sensation intact b/l. Pt has subjective symptoms of neuropathy.  Dermatological Examination: Pedal skin warm and supple b/l.   No open wounds. No interdigital macerations.  Toenails left 1-5 b/l thick, discolored, elongated with subungual debris and pain on dorsal palpation.    There is noted onchyolysis of entire nailplate of right great toe.  The nailbed remains intact. There is no erythema, no edema, no drainage, no underlying fluctuance.  No corns, calluses nor porokeratotic lesions noted. Pedal skin noted to be dry b/l lower extremities.   Musculoskeletal Examination: Muscle strength 5/5 to all lower extremity muscle groups bilaterally. Pes planus deformity noted bilateral LE. Patient ambulates independent of any assistive aids.  Radiographs:  None  Assessment/Plan: 1. Pain due to onychomycosis of toenails of both feet   2. Diabetic peripheral neuropathy associated with type 2 diabetes mellitus [facility])    Consent given for treatment. Patient examined. All patient's and/or POA's questions/concerns addressed on today's visit. Toenails 1-5 bilaterally debrided in length and girth without incident. Continue foot and shoe inspections daily. Monitor blood glucose per PCP/Endocrinologist's recommendations. Continue soft, supportive shoe gear daily. Report any pedal injuries to medical professional. Call office if there are any questions/concerns. Return in about 3 months (around 01/19/2025).  Shawn Benson, Shawn Benson      Shawn Benson: [address] [place] [address]                   Office [phone]   [facility]: [address] [place], [place] [address] Office [phone]      [1]  Allergies Allergen Reactions   Metformin  And Related Swelling   Other Swelling    Rybelsus (semaglutide)   Semaglutide Swelling   "

## 2024-10-22 ENCOUNTER — Encounter: Payer: Self-pay | Admitting: Podiatry

## 2024-10-22 ENCOUNTER — Telehealth: Payer: Self-pay | Admitting: Lab

## 2024-10-22 NOTE — Telephone Encounter (Signed)
 error

## 2024-11-04 ENCOUNTER — Encounter (INDEPENDENT_AMBULATORY_CARE_PROVIDER_SITE_OTHER): Admitting: Podiatry

## 2024-11-04 NOTE — Telephone Encounter (Signed)
1. ERRONEOUS ENCOUNTER--DISREGARD

## 2025-01-26 ENCOUNTER — Ambulatory Visit: Admitting: Podiatry
